# Patient Record
Sex: Male | Born: 1950
Health system: Southern US, Community
[De-identification: ages and names within clinical notes are randomized; demographics above are authoritative.]

## PROBLEM LIST (undated history)

## (undated) DIAGNOSIS — G971 Other reaction to spinal and lumbar puncture: Secondary | ICD-10-CM

## (undated) DIAGNOSIS — R2689 Other abnormalities of gait and mobility: Secondary | ICD-10-CM

## (undated) DIAGNOSIS — N529 Male erectile dysfunction, unspecified: Secondary | ICD-10-CM

## (undated) DIAGNOSIS — M549 Dorsalgia, unspecified: Secondary | ICD-10-CM

## (undated) DIAGNOSIS — R51 Headache: Secondary | ICD-10-CM

## (undated) DIAGNOSIS — IMO0001 Reserved for inherently not codable concepts without codable children: Secondary | ICD-10-CM

## (undated) DIAGNOSIS — R0609 Other forms of dyspnea: Secondary | ICD-10-CM

## (undated) DIAGNOSIS — K5900 Constipation, unspecified: Secondary | ICD-10-CM

## (undated) DIAGNOSIS — R42 Dizziness and giddiness: Secondary | ICD-10-CM

## (undated) DIAGNOSIS — E785 Hyperlipidemia, unspecified: Secondary | ICD-10-CM

## (undated) DIAGNOSIS — J349 Unspecified disorder of nose and nasal sinuses: Secondary | ICD-10-CM

## (undated) DIAGNOSIS — Z86010 Personal history of colonic polyps: Secondary | ICD-10-CM

## (undated) DIAGNOSIS — M2540 Effusion, unspecified joint: Secondary | ICD-10-CM

## (undated) DIAGNOSIS — R0602 Shortness of breath: Secondary | ICD-10-CM

## (undated) DIAGNOSIS — R112 Nausea with vomiting, unspecified: Secondary | ICD-10-CM

## (undated) DIAGNOSIS — I2510 Atherosclerotic heart disease of native coronary artery without angina pectoris: Secondary | ICD-10-CM

## (undated) DIAGNOSIS — H269 Unspecified cataract: Secondary | ICD-10-CM

## (undated) DIAGNOSIS — Z9889 Other specified postprocedural states: Secondary | ICD-10-CM

## (undated) DIAGNOSIS — G709 Myoneural disorder, unspecified: Secondary | ICD-10-CM

## (undated) DIAGNOSIS — E7800 Pure hypercholesterolemia, unspecified: Secondary | ICD-10-CM

## (undated) DIAGNOSIS — G4700 Insomnia, unspecified: Secondary | ICD-10-CM

## (undated) DIAGNOSIS — R319 Hematuria, unspecified: Secondary | ICD-10-CM

## (undated) DIAGNOSIS — Z9109 Other allergy status, other than to drugs and biological substances: Secondary | ICD-10-CM

## (undated) DIAGNOSIS — M1990 Unspecified osteoarthritis, unspecified site: Secondary | ICD-10-CM

## (undated) DIAGNOSIS — R079 Chest pain, unspecified: Secondary | ICD-10-CM

## (undated) DIAGNOSIS — R011 Cardiac murmur, unspecified: Secondary | ICD-10-CM

## (undated) DIAGNOSIS — K219 Gastro-esophageal reflux disease without esophagitis: Secondary | ICD-10-CM

## (undated) DIAGNOSIS — R61 Generalized hyperhidrosis: Secondary | ICD-10-CM

## (undated) DIAGNOSIS — G629 Polyneuropathy, unspecified: Secondary | ICD-10-CM

## (undated) DIAGNOSIS — M7989 Other specified soft tissue disorders: Secondary | ICD-10-CM

## (undated) DIAGNOSIS — I10 Essential (primary) hypertension: Secondary | ICD-10-CM

## (undated) DIAGNOSIS — T7840XA Allergy, unspecified, initial encounter: Secondary | ICD-10-CM

## (undated) DIAGNOSIS — M542 Cervicalgia: Secondary | ICD-10-CM

## (undated) DIAGNOSIS — R0989 Other specified symptoms and signs involving the circulatory and respiratory systems: Secondary | ICD-10-CM

## (undated) DIAGNOSIS — K5790 Diverticulosis of intestine, part unspecified, without perforation or abscess without bleeding: Secondary | ICD-10-CM

## (undated) HISTORY — DX: Cervicalgia: M54.2

## (undated) HISTORY — DX: Allergy, unspecified, initial encounter: T78.40XA

## (undated) HISTORY — DX: Hyperlipidemia, unspecified: E78.5

## (undated) HISTORY — DX: Atherosclerotic heart disease of native coronary artery without angina pectoris: I25.10

## (undated) HISTORY — DX: Other abnormalities of gait and mobility: R26.89

## (undated) HISTORY — DX: Unspecified cataract: H26.9

## (undated) HISTORY — DX: Effusion, unspecified joint: M25.40

## (undated) HISTORY — DX: Generalized hyperhidrosis: R61

## (undated) HISTORY — DX: Other specified soft tissue disorders: M79.89

## (undated) HISTORY — PX: FRACTURE SURGERY: SHX138

## (undated) HISTORY — PX: COLONOSCOPY: SHX174

## (undated) HISTORY — DX: Other forms of dyspnea: R06.09

## (undated) HISTORY — DX: Chest pain, unspecified: R07.9

## (undated) HISTORY — DX: Unspecified disorder of nose and nasal sinuses: J34.9

## (undated) HISTORY — DX: Dorsalgia, unspecified: M54.9

## (undated) HISTORY — PX: POLYPECTOMY: SHX149

## (undated) HISTORY — DX: Shortness of breath: R06.02

## (undated) HISTORY — DX: Hematuria, unspecified: R31.9

## (undated) HISTORY — PX: BACK SURGERY: SHX140

## (undated) HISTORY — DX: Pure hypercholesterolemia, unspecified: E78.00

## (undated) HISTORY — PX: CARDIAC CATHETERIZATION: SHX172

---
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
## 2001-09-01 ENCOUNTER — Observation Stay (HOSPITAL_COMMUNITY): Admission: EM | Admit: 2001-09-01 | Discharge: 2001-09-02 | Payer: Self-pay | Admitting: *Deleted

## 2001-09-01 ENCOUNTER — Encounter: Payer: Self-pay | Admitting: Orthopedic Surgery

## 2003-03-14 ENCOUNTER — Ambulatory Visit (HOSPITAL_COMMUNITY): Admission: RE | Admit: 2003-03-14 | Discharge: 2003-03-14 | Payer: Self-pay | Admitting: *Deleted

## 2005-06-21 ENCOUNTER — Ambulatory Visit: Payer: Self-pay | Admitting: Gastroenterology

## 2005-07-05 ENCOUNTER — Encounter: Payer: Self-pay | Admitting: Gastroenterology

## 2005-07-05 ENCOUNTER — Ambulatory Visit: Payer: Self-pay | Admitting: Gastroenterology

## 2010-10-29 ENCOUNTER — Other Ambulatory Visit: Payer: Self-pay | Admitting: Neurosurgery

## 2010-10-29 DIAGNOSIS — M549 Dorsalgia, unspecified: Secondary | ICD-10-CM

## 2010-10-29 DIAGNOSIS — M5410 Radiculopathy, site unspecified: Secondary | ICD-10-CM

## 2010-10-29 DIAGNOSIS — M5126 Other intervertebral disc displacement, lumbar region: Secondary | ICD-10-CM

## 2010-10-30 ENCOUNTER — Ambulatory Visit
Admission: RE | Admit: 2010-10-30 | Discharge: 2010-10-30 | Disposition: A | Discharge status: Home / Self Care | Payer: 59 | Source: Ambulatory Visit | Attending: Neurosurgery | Admitting: Neurosurgery

## 2010-10-30 DIAGNOSIS — M5126 Other intervertebral disc displacement, lumbar region: Secondary | ICD-10-CM

## 2010-10-30 DIAGNOSIS — M5410 Radiculopathy, site unspecified: Secondary | ICD-10-CM

## 2010-10-30 DIAGNOSIS — M549 Dorsalgia, unspecified: Secondary | ICD-10-CM

## 2010-11-17 ENCOUNTER — Other Ambulatory Visit: Payer: Self-pay | Admitting: Neurosurgery

## 2010-11-17 DIAGNOSIS — M549 Dorsalgia, unspecified: Secondary | ICD-10-CM

## 2010-11-17 DIAGNOSIS — M5410 Radiculopathy, site unspecified: Secondary | ICD-10-CM

## 2010-11-19 ENCOUNTER — Encounter (HOSPITAL_COMMUNITY)
Admission: RE | Admit: 2010-11-19 | Discharge: 2010-11-19 | Disposition: A | Discharge status: Home / Self Care | Payer: 59 | Source: Ambulatory Visit | Attending: Neurosurgery | Admitting: Neurosurgery

## 2010-11-19 ENCOUNTER — Other Ambulatory Visit (HOSPITAL_COMMUNITY): Payer: Self-pay | Admitting: Neurosurgery

## 2010-11-19 ENCOUNTER — Other Ambulatory Visit: Payer: 59

## 2010-11-19 ENCOUNTER — Ambulatory Visit (HOSPITAL_COMMUNITY)
Admission: RE | Admit: 2010-11-19 | Discharge: 2010-11-19 | Disposition: A | Discharge status: Home / Self Care | Payer: 59 | Source: Ambulatory Visit | Attending: Neurosurgery | Admitting: Neurosurgery

## 2010-11-19 DIAGNOSIS — Z01810 Encounter for preprocedural cardiovascular examination: Secondary | ICD-10-CM | POA: Insufficient documentation

## 2010-11-19 DIAGNOSIS — M5126 Other intervertebral disc displacement, lumbar region: Secondary | ICD-10-CM

## 2010-11-19 DIAGNOSIS — Z01812 Encounter for preprocedural laboratory examination: Secondary | ICD-10-CM | POA: Insufficient documentation

## 2010-11-19 DIAGNOSIS — Z01818 Encounter for other preprocedural examination: Secondary | ICD-10-CM | POA: Insufficient documentation

## 2010-11-19 DIAGNOSIS — I10 Essential (primary) hypertension: Secondary | ICD-10-CM | POA: Insufficient documentation

## 2010-11-19 LAB — BASIC METABOLIC PANEL
BUN: 26 mg/dL — ABNORMAL HIGH (ref 6–23)
Calcium: 9.1 mg/dL (ref 8.4–10.5)
GFR calc Af Amer: >60 mL/min (ref >60)
GFR calc non Af Amer: 55 mL/min — ABNORMAL LOW (ref >60)
Glucose, Bld: 133 mg/dL — ABNORMAL HIGH (ref 70–99)
Potassium: 4.3 mEq/L (ref 3.5–5.1)
Sodium: 133 mEq/L — ABNORMAL LOW (ref 135–145)

## 2010-11-19 LAB — CBC
HCT: 48.4 % (ref 39.0–52.0)
MCH: 31.9 pg (ref 26.0–34.0)
MCHC: 34.9 g/dL (ref 30.0–36.0)
RDW: 13.4 % (ref 11.5–15.5)

## 2010-11-20 ENCOUNTER — Ambulatory Visit (HOSPITAL_COMMUNITY): Payer: 59

## 2010-11-20 ENCOUNTER — Inpatient Hospital Stay (HOSPITAL_COMMUNITY)
Admission: RE | Admit: 2010-11-20 | Discharge: 2010-11-26 | DRG: 490 | Disposition: A | Discharge status: Home / Self Care | Payer: 59 | Source: Ambulatory Visit | Attending: Neurosurgery | Admitting: Neurosurgery

## 2010-11-20 DIAGNOSIS — G834 Cauda equina syndrome: Secondary | ICD-10-CM | POA: Diagnosis present

## 2010-11-20 DIAGNOSIS — IMO0002 Reserved for concepts with insufficient information to code with codable children: Secondary | ICD-10-CM | POA: Diagnosis not present

## 2010-11-20 DIAGNOSIS — Y9210 Unspecified residential institution as the place of occurrence of the external cause: Secondary | ICD-10-CM | POA: Diagnosis not present

## 2010-11-20 DIAGNOSIS — K219 Gastro-esophageal reflux disease without esophagitis: Secondary | ICD-10-CM | POA: Diagnosis present

## 2010-11-20 DIAGNOSIS — M48061 Spinal stenosis, lumbar region without neurogenic claudication: Principal | ICD-10-CM | POA: Diagnosis present

## 2010-11-20 DIAGNOSIS — G9741 Accidental puncture or laceration of dura during a procedure: Secondary | ICD-10-CM | POA: Diagnosis not present

## 2010-11-20 DIAGNOSIS — I10 Essential (primary) hypertension: Secondary | ICD-10-CM | POA: Diagnosis present

## 2010-11-22 LAB — URINALYSIS, ROUTINE W REFLEX MICROSCOPIC
Bilirubin Urine: NEGATIVE
Glucose, UA: NEGATIVE mg/dL
Ketones, ur: NEGATIVE mg/dL
Leukocytes, UA: NEGATIVE
Protein, ur: NEGATIVE mg/dL
pH: 6 (ref 5.0–8.0)

## 2010-11-22 LAB — CBC
HCT: 37.8 % — ABNORMAL LOW (ref 39.0–52.0)
Hemoglobin: 13.2 g/dL (ref 13.0–17.0)
MCH: 31.8 pg (ref 26.0–34.0)
MCHC: 34.9 g/dL (ref 30.0–36.0)
MCV: 91.1 fL (ref 78.0–100.0)
RDW: 13.5 % (ref 11.5–15.5)

## 2010-11-22 LAB — BASIC METABOLIC PANEL
BUN: 17 mg/dL (ref 6–23)
Calcium: 9.4 mg/dL (ref 8.4–10.5)
Creatinine, Ser: 0.85 mg/dL (ref 0.50–1.35)
GFR calc Af Amer: >60 mL/min (ref >60)
GFR calc non Af Amer: >60 mL/min (ref >60)
Glucose, Bld: 129 mg/dL — ABNORMAL HIGH (ref 70–99)
Potassium: 3.9 mEq/L (ref 3.5–5.1)

## 2010-11-23 NOTE — Op Note (Signed)
NAMEDEPAUL, ARIZPE               ACCOUNT NO.:  [id number]  MEDICAL RECORD NO.:  [id number]  LOCATION:  3033                         FACILITY:  [facility]  PHYSICIAN:  Hilda Lias, M.D.   DATE OF BIRTH:  1951-03-12  DATE OF PROCEDURE:  11/20/2010 DATE OF DISCHARGE:                              OPERATIVE REPORT   PREOPERATIVE DIAGNOSES:  Lumbar stenosis with stenosis at L3-4, L4-5, L5- S1 with a right L3-L4 herniated disk, cauda equina syndrome.  POSTOPERATIVE DIAGNOSES:  Lumbar stenosis with stenosis at L3-4, L4-5, L5-S1 with a right L3-L4 herniated disk, cauda equina syndrome.  PROCEDURE:  Bilateral 3, 4, 5 laminectomy, foraminotomy, right L3-L4 diskectomy.  Repair of iatrogenic dural tear at the level of right 3-4. Microscope.  SURGEON:  Hilda Lias, MD  ASSISTANT:  Clydene Fake, M.D.  CLINICAL HISTORY:  [name] is a gentleman who I have seen in my office complaining of back pain worsened to both legs.  The patient has stenosis at the level of 3-4, 4-5, 5-1.  The patient wanted to be conservative and to avoid any surgery.  He called me from [facility], [place],  where he was working currently and the pain was getting worse.  Next Monday, he was supposed to see me, but he decided to wait. Nevertheless, he came back with his girlfriend and at that time I found that he had severe weakness of dorsiflexion of the feet, weakness of the both quadriceps, weakness of plantar flexion, numbness in both legs, and difficulty urinating.  I offered him about the need for surgery immediately because he will develop the beginning of cauda equina syndrome.  Nevertheless, he declined surgery.  laast week,  he has an epidural injection at the level of L3-4.  Then, last night we got a call telling us that he has decided to go ahead with the surgery.  He later on called back to cancel, but finally he agreed with the surgery.  In the meantime, he had an epidural injection as I mentioned  above.  He came this morning.  He is taking Naprosyn, aspirin, and fish oil. Surgery was advised and the risks were fully explained to them including the possibility of bleeding because of the medication he was taking, no improvement  whatsoever because already the amount of weakness he is having and the possibility of CSF leak, also infection.  PROCEDURE IN DETAIL:  The patient was taken to the OR and after intubation he was positioned in prone manner.  The back was cleaned with DuraPrep.  Drapes were applied.  Midline incision was made.  The patient has quite a bit of adipose tissue and muscle and we were unable to feel any spinous process.  Incision was carried out and we were able to identify the L5, S1, L4-5 and L3-4 space.  Then, with the Leksell we removed the spinous process of 3, 4, 5 and with the drill we started drilling the lamina of 3, 4, 5 bilaterally.  The patient had quite a bit of overgrowth of the facet with thickening of the yellow ligament.  We started working all the way down from L5-S1 with decompression of the thecal sac  as well as the L5-S1 nerve root at the level of L4-5 with posterior decompression and removal of thick yellow ligament and foraminotomy to decompress both L4 and L5 nerve root.  Then, we started our surgery at the level of L3-4,  first on the left side and later on the right side.  Once we opened the yellow ligament, immediately we saw CSF coming.  Because of that, we had to drill a little bit higher up above L3 and worked on the right side from the top down.  We found that there was an opening in the dura mater dorsal anterior.  The opening was surrounded by a white glue that mostly was the Depo-Medrol we normally use for the epidural injection.  Then, with the help of the microscope, we used the 6-0 Prolene and 3 single stitches were used to repair the dura matter.  Valsalva maneuver performed was negative.  Then we retracted the thecal sac.  We  identified the L3 nerve root.  We went ahead and we found a large herniated disk mostly compromising the L3 and L4 nerve root.  Incision was made and large amount of degenerative disk disease medial and lateral were removed.  At the end, we had plenty of space for the thecal sac as well as the L3, 4, 5, and S1 nerve root. The patient has quite a bit of bleeding coming mostly from the bone tissue.  This was probably secondary to the fish oil and the Naprosyn that the patient had been taking.  Then, Surgicel was used to help out with the hemostasis.  Once this was achieved, the wound was closed with Vicryl and staples.  The patient is going to go to 3000.  He will remain flat in bed with the head of the bed down 5 degrees for the next 72 hours.          ______________________________ Hilda Lias, M.D.     EB/MEDQ  D:  11/20/2010  T:  11/21/2010  Job:  [id number]  Electronically Signed by Hilda Lias M.D. on 11/23/2010 01:50:00 PM

## 2010-11-24 DIAGNOSIS — M5106 Intervertebral disc disorders with myelopathy, lumbar region: Secondary | ICD-10-CM

## 2010-11-24 LAB — BASIC METABOLIC PANEL
Calcium: 8.2 mg/dL — ABNORMAL LOW (ref 8.4–10.5)
GFR calc Af Amer: >60 mL/min (ref >60)
GFR calc non Af Amer: 57 mL/min — ABNORMAL LOW (ref >60)
Glucose, Bld: 109 mg/dL — ABNORMAL HIGH (ref 70–99)
Potassium: 3.7 mEq/L (ref 3.5–5.1)
Sodium: 132 mEq/L — ABNORMAL LOW (ref 135–145)

## 2010-11-24 LAB — CBC
Hemoglobin: 13.2 g/dL (ref 13.0–17.0)
MCH: 31.9 pg (ref 26.0–34.0)
MCHC: 34.4 g/dL (ref 30.0–36.0)
RDW: 13.8 % (ref 11.5–15.5)

## 2010-11-27 NOTE — Discharge Summary (Signed)
  Darin Gomez, Darin Gomez               ACCOUNT NO.:  [id number]  MEDICAL RECORD NO.:  [id number]  LOCATION:  3033                         FACILITY:  [facility]  PHYSICIAN:  Hilda Lias, M.D.   DATE OF BIRTH:  [birth year]  DATE OF ADMISSION:  11/20/2010 DATE OF DISCHARGE:  11/26/2010                              DISCHARGE SUMMARY   ADMISSION DIAGNOSES:  Lumbar stenosis with cauda equina syndrome.  FINAL DIAGNOSES:  Lumbar stenosis with cauda equina syndrome.  CLINICAL HISTORY:  The patient was admitted because of back pain worse to both legs associated with weakness which developed quickly.  The patient also has difficulty urinating.  The patient has severe stenosis with a herniated disk at the level of L3-4 and stenosis L3-4, L4-5, L5- S1.  Surgery was advised.  Laboratory normal.  COURSE IN THE HOSPITAL:  The patient was taken to surgery, decompressive laminectomy and diskectomy was done.  The patient had quite a bit of pain at the beginning, but today he is ambulating.  He is ready to walk, he is urinating without any problem.  The patient was seen by Physical Therapy and Occupational Therapy.  Today, he is being discharged to be followed by me in 10 days.  CONDITION ON DISCHARGE:  Improvement.  MEDICATION:  Oxycodone, baclofen and Neurontin.  DIET:  Regular activity.  Not to drive for at least 10 days.  FOLLOWUP:  He will be seen by me on December 07, 2010, any question between now and then he is to call anytime.          ______________________________ Hilda Lias, M.D.     EB/MEDQ  D:  11/26/2010  T:  11/26/2010  Job:  [id number]  Electronically Signed by Hilda Lias M.D. on 11/27/2010 11:27:47 AM

## 2010-12-05 ENCOUNTER — Emergency Department (HOSPITAL_COMMUNITY)
Admission: EM | Admit: 2010-12-05 | Discharge: 2010-12-05 | Disposition: A | Discharge status: Home / Self Care | Payer: 59 | Attending: Emergency Medicine | Admitting: Emergency Medicine

## 2010-12-05 DIAGNOSIS — M7989 Other specified soft tissue disorders: Secondary | ICD-10-CM | POA: Insufficient documentation

## 2010-12-05 DIAGNOSIS — I10 Essential (primary) hypertension: Secondary | ICD-10-CM | POA: Insufficient documentation

## 2010-12-05 DIAGNOSIS — M79609 Pain in unspecified limb: Secondary | ICD-10-CM

## 2010-12-05 DIAGNOSIS — Z79899 Other long term (current) drug therapy: Secondary | ICD-10-CM | POA: Insufficient documentation

## 2010-12-05 DIAGNOSIS — E7800 Pure hypercholesterolemia, unspecified: Secondary | ICD-10-CM | POA: Insufficient documentation

## 2010-12-05 LAB — BASIC METABOLIC PANEL
BUN: 12 mg/dL (ref 6–23)
Creatinine, Ser: 1.01 mg/dL (ref 0.50–1.35)
GFR calc Af Amer: >60 mL/min (ref >60)
GFR calc non Af Amer: >60 mL/min (ref >60)

## 2010-12-05 LAB — DIFFERENTIAL
Basophils Absolute: 0 10*3/uL (ref 0.0–0.1)
Basophils Relative: 0 % (ref 0–1)
Eosinophils Relative: 3 % (ref 0–5)
Lymphocytes Relative: 25 % (ref 12–46)
Monocytes Absolute: 1.1 10*3/uL — ABNORMAL HIGH (ref 0.1–1.0)
Neutro Abs: 3.6 10*3/uL (ref 1.7–7.7)

## 2010-12-05 LAB — PROTIME-INR
INR: 1.06 (ref 0.00–1.49)
Prothrombin Time: 14 seconds (ref 11.6–15.2)

## 2010-12-05 LAB — APTT: aPTT: 34 seconds (ref 24–37)

## 2010-12-05 LAB — CBC
Hemoglobin: 12.1 g/dL — ABNORMAL LOW (ref 13.0–17.0)
MCHC: 34.9 g/dL (ref 30.0–36.0)
RDW: 13.6 % (ref 11.5–15.5)
WBC: 6.7 10*3/uL (ref 4.0–10.5)

## 2012-01-27 ENCOUNTER — Other Ambulatory Visit: Payer: Self-pay | Admitting: Anesthesiology

## 2012-01-27 DIAGNOSIS — R159 Full incontinence of feces: Secondary | ICD-10-CM

## 2012-01-27 NOTE — Progress Notes (Signed)
Blood drawn for labs for MRI Sunday; from right [facility] space without difficulty; site unremarkable.  Victory Dakin, RN

## 2012-01-30 ENCOUNTER — Ambulatory Visit
Admission: RE | Admit: 2012-01-30 | Discharge: 2012-01-30 | Disposition: A | Discharge status: Home / Self Care | Payer: 59 | Source: Ambulatory Visit | Attending: Anesthesiology | Admitting: Anesthesiology

## 2012-01-30 DIAGNOSIS — R159 Full incontinence of feces: Secondary | ICD-10-CM

## 2012-01-30 MED ORDER — GADOBENATE DIMEGLUMINE 529 MG/ML IV SOLN
20.0000 mL | Freq: Once | INTRAVENOUS | Status: AC | PRN
  Administered 2012-01-30: 20 mL via INTRAVENOUS

## 2012-02-22 ENCOUNTER — Other Ambulatory Visit: Payer: Self-pay | Admitting: Neurosurgery

## 2012-02-22 DIAGNOSIS — M549 Dorsalgia, unspecified: Secondary | ICD-10-CM

## 2012-02-22 DIAGNOSIS — M5410 Radiculopathy, site unspecified: Secondary | ICD-10-CM

## 2012-02-24 ENCOUNTER — Ambulatory Visit
Admission: RE | Admit: 2012-02-24 | Discharge: 2012-02-24 | Disposition: A | Discharge status: Home / Self Care | Payer: 59 | Source: Ambulatory Visit | Attending: Neurosurgery | Admitting: Neurosurgery

## 2012-02-24 VITALS — BP 106/58 | HR 54

## 2012-02-24 DIAGNOSIS — M5410 Radiculopathy, site unspecified: Secondary | ICD-10-CM

## 2012-02-24 DIAGNOSIS — M549 Dorsalgia, unspecified: Secondary | ICD-10-CM

## 2012-02-24 MED ORDER — IOHEXOL 180 MG/ML  SOLN
16.0000 mL | Freq: Once | INTRAMUSCULAR | Status: AC | PRN
  Administered 2012-02-24: 16 mL via INTRATHECAL

## 2012-02-24 MED ORDER — HYDROMORPHONE HCL PF 2 MG/ML IJ SOLN
2.0000 mg | Freq: Once | INTRAMUSCULAR | Status: AC
  Administered 2012-02-24: 2 mg via INTRAMUSCULAR

## 2012-02-24 MED ORDER — ONDANSETRON HCL 4 MG/2ML IJ SOLN
4.0000 mg | Freq: Once | INTRAMUSCULAR | Status: AC
  Administered 2012-02-24: 4 mg via INTRAMUSCULAR

## 2012-03-01 ENCOUNTER — Other Ambulatory Visit: Payer: Self-pay | Admitting: Neurosurgery

## 2012-03-13 ENCOUNTER — Encounter (HOSPITAL_COMMUNITY): Payer: Self-pay | Admitting: Pharmacy Technician

## 2012-03-13 NOTE — Pre-Procedure Instructions (Signed)
[address]   03/13/2012   Your procedure is scheduled on:  Wednesday March 15, 2012.   Report to [facility] at 0630 AM.  Call this number if you have problems the morning of surgery: [phone]   Remember:   Do not eat food or drink any liquids:After Midnight Tuesday    Take these medicines the morning of surgery with A SIP OF WATER: baclofen, pain medication   Do not wear jewelry  Do not wear lotions or colognes  Men may shave face and neck.   Do not bring valuables to the hospital.  Contacts, dentures or bridgework may not be worn into surgery.  Leave suitcase in the car. After surgery it may be brought to your room.  For patients admitted to the hospital, checkout time is 11:00 AM the day of discharge.   Patients discharged the day of surgery will not be allowed to drive home.   Name and phone number of your driver:    Special Instructions: Shower using CHG 2 nights before surgery and the night before surgery.  If you shower the day of surgery use CHG.  Use special wash - you have one bottle of CHG for all showers.  You should use approximately 1/3 of the bottle for each shower.   Please read over the following fact sheets that you were given: Pain Booklet, Coughing and Deep Breathing, MRSA Information and Surgical Site Infection Prevention

## 2012-03-14 ENCOUNTER — Ambulatory Visit (HOSPITAL_COMMUNITY)
Admission: RE | Admit: 2012-03-14 | Discharge: 2012-03-14 | Disposition: A | Discharge status: Home / Self Care | Payer: 59 | Source: Ambulatory Visit | Attending: Neurosurgery | Admitting: Neurosurgery

## 2012-03-14 ENCOUNTER — Encounter (HOSPITAL_COMMUNITY): Payer: Self-pay

## 2012-03-14 ENCOUNTER — Encounter (HOSPITAL_COMMUNITY)
Admission: RE | Admit: 2012-03-14 | Discharge: 2012-03-14 | Disposition: A | Discharge status: Home / Self Care | Payer: 59 | Source: Ambulatory Visit | Attending: Neurosurgery | Admitting: Neurosurgery

## 2012-03-14 DIAGNOSIS — Z01818 Encounter for other preprocedural examination: Secondary | ICD-10-CM | POA: Insufficient documentation

## 2012-03-14 DIAGNOSIS — Z01812 Encounter for preprocedural laboratory examination: Secondary | ICD-10-CM | POA: Insufficient documentation

## 2012-03-14 HISTORY — DX: Headache: R51

## 2012-03-14 HISTORY — DX: Other specified postprocedural states: R11.2

## 2012-03-14 HISTORY — DX: Nausea with vomiting, unspecified: R11.2

## 2012-03-14 HISTORY — DX: Essential (primary) hypertension: I10

## 2012-03-14 HISTORY — DX: Other specified postprocedural states: Z98.890

## 2012-03-14 HISTORY — DX: Cardiac murmur, unspecified: R01.1

## 2012-03-14 HISTORY — DX: Unspecified osteoarthritis, unspecified site: M19.90

## 2012-03-14 LAB — SURGICAL PCR SCREEN
MRSA, PCR: NEGATIVE
Staphylococcus aureus: NEGATIVE

## 2012-03-14 LAB — BASIC METABOLIC PANEL
Chloride: 101 mEq/L (ref 96–112)
GFR calc Af Amer: >90 mL/min (ref >90)
Potassium: 3.7 mEq/L (ref 3.5–5.1)
Sodium: 137 mEq/L (ref 135–145)

## 2012-03-14 LAB — CBC
HCT: 45.4 % (ref 39.0–52.0)
Hemoglobin: 15.7 g/dL (ref 13.0–17.0)
RBC: 5.17 MIL/uL (ref 4.22–5.81)
RDW: 12.9 % (ref 11.5–15.5)
WBC: 8.1 10*3/uL (ref 4.0–10.5)

## 2012-03-14 NOTE — Progress Notes (Addendum)
11:55    Left message at office to have Dr. Jeral Fruit release his orders (they were under "signed & held"...DA  12:15    PT. STATED THAT DURING HIS LAST SURGERY, IT TOOK HIM LONGER TO WAKE UP...(ALSO LOST A FAIR AMOUNT OF BLOOD)....DA  12:35   pT STATED THAT AROUND 2 MTHS AGO .Marland KitchenVITREOUS IN LEFT EYE IS STARTING TO PULL AWAY. AND, THAT BACK IN June 2012 FOR THAT SURGERY,  AFTER USING THE CHG SOAP, HE HAD A RASH COME UP .Marland KitchenI INSTRUCTED HIM TO SHOWER WITH DIAL ANTIBACTERIAL SOAP ...Marland KitchenMarland KitchenDA

## 2012-03-15 ENCOUNTER — Ambulatory Visit (HOSPITAL_COMMUNITY): Payer: 59 | Admitting: *Deleted

## 2012-03-15 ENCOUNTER — Encounter (HOSPITAL_COMMUNITY)
Admission: RE | Disposition: A | Discharge status: Home / Self Care | Payer: Self-pay | Source: Ambulatory Visit | Attending: Neurosurgery

## 2012-03-15 ENCOUNTER — Encounter (HOSPITAL_COMMUNITY): Payer: Self-pay | Admitting: *Deleted

## 2012-03-15 ENCOUNTER — Ambulatory Visit (HOSPITAL_COMMUNITY): Payer: 59

## 2012-03-15 ENCOUNTER — Inpatient Hospital Stay (HOSPITAL_COMMUNITY)
Admission: RE | Admit: 2012-03-15 | Discharge: 2012-03-26 | DRG: 490 | Disposition: A | Discharge status: Home / Self Care | Payer: 59 | Source: Ambulatory Visit | Attending: Neurosurgery | Admitting: Neurosurgery

## 2012-03-15 DIAGNOSIS — Z87891 Personal history of nicotine dependence: Secondary | ICD-10-CM

## 2012-03-15 DIAGNOSIS — I10 Essential (primary) hypertension: Secondary | ICD-10-CM | POA: Diagnosis present

## 2012-03-15 DIAGNOSIS — IMO0002 Reserved for concepts with insufficient information to code with codable children: Secondary | ICD-10-CM | POA: Diagnosis not present

## 2012-03-15 DIAGNOSIS — Z79899 Other long term (current) drug therapy: Secondary | ICD-10-CM

## 2012-03-15 DIAGNOSIS — Z7982 Long term (current) use of aspirin: Secondary | ICD-10-CM

## 2012-03-15 DIAGNOSIS — R339 Retention of urine, unspecified: Secondary | ICD-10-CM | POA: Diagnosis not present

## 2012-03-15 DIAGNOSIS — N319 Neuromuscular dysfunction of bladder, unspecified: Secondary | ICD-10-CM | POA: Diagnosis present

## 2012-03-15 DIAGNOSIS — M48062 Spinal stenosis, lumbar region with neurogenic claudication: Principal | ICD-10-CM | POA: Diagnosis present

## 2012-03-15 DIAGNOSIS — G9741 Accidental puncture or laceration of dura during a procedure: Secondary | ICD-10-CM | POA: Diagnosis not present

## 2012-03-15 HISTORY — PX: LUMBAR LAMINECTOMY/DECOMPRESSION MICRODISCECTOMY: SHX5026

## 2012-03-15 SURGERY — LUMBAR LAMINECTOMY/DECOMPRESSION MICRODISCECTOMY 2 LEVELS
Anesthesia: General | Site: Back | Laterality: Bilateral | Wound class: Clean

## 2012-03-15 MED ORDER — OXYCODONE HCL 5 MG PO TABS: 5.0000 mg | ORAL_TABLET | Freq: Once | ORAL | Status: DC | PRN

## 2012-03-15 MED ORDER — PHENOL 1.4 % MT LIQD: 1.0000 | OROMUCOSAL | Status: DC | PRN

## 2012-03-15 MED ORDER — HYDROMORPHONE HCL PF 1 MG/ML IJ SOLN
1.0000 mg | Freq: Once | INTRAMUSCULAR | Status: AC
  Administered 2012-03-15: 1 mg via INTRAVENOUS

## 2012-03-15 MED ORDER — SODIUM CHLORIDE 0.9 % IV SOLN
INTRAVENOUS | Status: DC
  Administered 2012-03-15: 17:00:00 via INTRAVENOUS
  Administered 2012-03-16: 75 mL via INTRAVENOUS
  Administered 2012-03-16 – 2012-03-21 (×9): via INTRAVENOUS
  Administered 2012-03-23: 1000 mL via INTRAVENOUS
  Administered 2012-03-24: 09:00:00 via INTRAVENOUS

## 2012-03-15 MED ORDER — LACTATED RINGERS IV SOLN
INTRAVENOUS | Status: DC | PRN
  Administered 2012-03-15 (×4): via INTRAVENOUS

## 2012-03-15 MED ORDER — MORPHINE SULFATE 4 MG/ML IJ SOLN
4.0000 mg | INTRAMUSCULAR | Status: DC | PRN
  Administered 2012-03-15: 4 mg via INTRAVENOUS
  Administered 2012-03-15: 2 mg via INTRAVENOUS
  Administered 2012-03-16 (×2): 4 mg via INTRAVENOUS
  Administered 2012-03-16 (×2): 2 mg via INTRAVENOUS
  Administered 2012-03-17 (×4): 4 mg via INTRAVENOUS
  Administered 2012-03-17: 2 mg via INTRAVENOUS
  Administered 2012-03-17 – 2012-03-21 (×22): 4 mg via INTRAVENOUS
  Filled 2012-03-15 (×33): qty 1

## 2012-03-15 MED ORDER — BACLOFEN 20 MG PO TABS
20.0000 mg | ORAL_TABLET | Freq: Three times a day (TID) | ORAL | Status: DC
  Administered 2012-03-15 – 2012-03-26 (×33): 20 mg via ORAL
  Filled 2012-03-15 (×37): qty 1

## 2012-03-15 MED ORDER — THROMBIN 5000 UNITS EX KIT
PACK | CUTANEOUS | Status: DC | PRN
  Administered 2012-03-15 (×4): 5000 [IU] via TOPICAL

## 2012-03-15 MED ORDER — ACETAMINOPHEN 650 MG RE SUPP: 650.0000 mg | RECTAL | Status: DC | PRN

## 2012-03-15 MED ORDER — SODIUM CHLORIDE 0.9 % IJ SOLN
3.0000 mL | Freq: Two times a day (BID) | INTRAMUSCULAR | Status: DC
  Administered 2012-03-15 – 2012-03-22 (×9): 3 mL via INTRAVENOUS
  Administered 2012-03-23: 09:00:00 via INTRAVENOUS
  Administered 2012-03-25 (×2): 3 mL via INTRAVENOUS

## 2012-03-15 MED ORDER — OXYCODONE-ACETAMINOPHEN 5-325 MG PO TABS
1.0000 | ORAL_TABLET | ORAL | Status: DC | PRN
  Administered 2012-03-15 – 2012-03-21 (×29): 2 via ORAL
  Filled 2012-03-15 (×29): qty 2

## 2012-03-15 MED ORDER — GLYCOPYRROLATE 0.2 MG/ML IJ SOLN
INTRAMUSCULAR | Status: DC | PRN
  Administered 2012-03-15: .6 mg via INTRAVENOUS

## 2012-03-15 MED ORDER — HYDROMORPHONE HCL PF 1 MG/ML IJ SOLN
INTRAMUSCULAR | Status: AC
  Filled 2012-03-15: qty 1

## 2012-03-15 MED ORDER — MIDAZOLAM HCL 5 MG/5ML IJ SOLN
INTRAMUSCULAR | Status: DC | PRN
  Administered 2012-03-15: 2 mg via INTRAVENOUS

## 2012-03-15 MED ORDER — ONDANSETRON HCL 4 MG/2ML IJ SOLN: 4.0000 mg | Freq: Once | INTRAMUSCULAR | Status: DC | PRN

## 2012-03-15 MED ORDER — ROCURONIUM BROMIDE 100 MG/10ML IV SOLN
INTRAVENOUS | Status: DC | PRN
  Administered 2012-03-15: 50 mg via INTRAVENOUS

## 2012-03-15 MED ORDER — ACETAMINOPHEN 325 MG PO TABS
650.0000 mg | ORAL_TABLET | ORAL | Status: DC | PRN
  Administered 2012-03-21 – 2012-03-26 (×12): 650 mg via ORAL
  Filled 2012-03-15 (×12): qty 2

## 2012-03-15 MED ORDER — CEFAZOLIN SODIUM 1-5 GM-% IV SOLN
1.0000 g | Freq: Three times a day (TID) | INTRAVENOUS | Status: AC
  Administered 2012-03-15 – 2012-03-16 (×2): 1 g via INTRAVENOUS
  Filled 2012-03-15 (×2): qty 50

## 2012-03-15 MED ORDER — TAMSULOSIN HCL 0.4 MG PO CAPS
0.8000 mg | ORAL_CAPSULE | Freq: Every day | ORAL | Status: DC
  Administered 2012-03-16 – 2012-03-26 (×11): 0.8 mg via ORAL
  Filled 2012-03-15 (×11): qty 2

## 2012-03-15 MED ORDER — DIAZEPAM 5 MG PO TABS: 5.0000 mg | ORAL_TABLET | Freq: Four times a day (QID) | ORAL | Status: DC | PRN

## 2012-03-15 MED ORDER — LORATADINE 10 MG PO TABS
10.0000 mg | ORAL_TABLET | Freq: Every day | ORAL | Status: DC
  Administered 2012-03-15 – 2012-03-26 (×12): 10 mg via ORAL
  Filled 2012-03-15 (×12): qty 1

## 2012-03-15 MED ORDER — MIDAZOLAM HCL 2 MG/2ML IJ SOLN
1.0000 mg | Freq: Once | INTRAMUSCULAR | Status: AC
  Administered 2012-03-15: 1 mg via INTRAVENOUS

## 2012-03-15 MED ORDER — ALBUMIN HUMAN 5 % IV SOLN
INTRAVENOUS | Status: DC | PRN
  Administered 2012-03-15: 11:00:00 via INTRAVENOUS

## 2012-03-15 MED ORDER — LISINOPRIL-HYDROCHLOROTHIAZIDE 20-12.5 MG PO TABS: 1.0000 | ORAL_TABLET | Freq: Every day | ORAL | Status: DC

## 2012-03-15 MED ORDER — HYDROMORPHONE HCL PF 1 MG/ML IJ SOLN
INTRAMUSCULAR | Status: DC | PRN
  Administered 2012-03-15: .3 mg via INTRAVENOUS
  Administered 2012-03-15: .4 mg via INTRAVENOUS
  Administered 2012-03-15: .3 mg via INTRAVENOUS

## 2012-03-15 MED ORDER — NEOSTIGMINE METHYLSULFATE 1 MG/ML IJ SOLN
INTRAMUSCULAR | Status: DC | PRN
  Administered 2012-03-15: 5 mg via INTRAVENOUS

## 2012-03-15 MED ORDER — PROPOFOL 10 MG/ML IV BOLUS
INTRAVENOUS | Status: DC | PRN
  Administered 2012-03-15: 160 mg via INTRAVENOUS

## 2012-03-15 MED ORDER — ONDANSETRON HCL 4 MG/2ML IJ SOLN
4.0000 mg | INTRAMUSCULAR | Status: DC | PRN
  Administered 2012-03-17 – 2012-03-24 (×15): 4 mg via INTRAVENOUS
  Filled 2012-03-15 (×15): qty 2

## 2012-03-15 MED ORDER — NICOTINE 14 MG/24HR TD PT24
14.0000 mg | MEDICATED_PATCH | TRANSDERMAL | Status: DC
  Administered 2012-03-15 – 2012-03-17 (×2): 14 mg via TRANSDERMAL
  Filled 2012-03-15 (×4): qty 1

## 2012-03-15 MED ORDER — HEMOSTATIC AGENTS (NO CHARGE) OPTIME
TOPICAL | Status: DC | PRN
  Administered 2012-03-15 (×2): 1 via TOPICAL

## 2012-03-15 MED ORDER — SODIUM CHLORIDE 0.9 % IV SOLN: 250.0000 mL | INTRAVENOUS | Status: DC

## 2012-03-15 MED ORDER — LIDOCAINE HCL (CARDIAC) 20 MG/ML IV SOLN
INTRAVENOUS | Status: DC | PRN
  Administered 2012-03-15: 100 mg via INTRAVENOUS

## 2012-03-15 MED ORDER — OXYCODONE HCL 5 MG/5ML PO SOLN: 5.0000 mg | Freq: Once | ORAL | Status: DC | PRN

## 2012-03-15 MED ORDER — ONDANSETRON HCL 4 MG/2ML IJ SOLN
INTRAMUSCULAR | Status: DC | PRN
  Administered 2012-03-15: 4 mg via INTRAVENOUS

## 2012-03-15 MED ORDER — FENTANYL CITRATE 0.05 MG/ML IJ SOLN
INTRAMUSCULAR | Status: DC | PRN
  Administered 2012-03-15 (×3): 50 ug via INTRAVENOUS
  Administered 2012-03-15: 100 ug via INTRAVENOUS
  Administered 2012-03-15: 50 ug via INTRAVENOUS
  Administered 2012-03-15 (×2): 100 ug via INTRAVENOUS

## 2012-03-15 MED ORDER — HYDROMORPHONE HCL PF 1 MG/ML IJ SOLN
0.2500 mg | INTRAMUSCULAR | Status: DC | PRN
  Administered 2012-03-15 (×4): 0.5 mg via INTRAVENOUS

## 2012-03-15 MED ORDER — SODIUM CHLORIDE 0.9 % IJ SOLN: 3.0000 mL | INTRAMUSCULAR | Status: DC | PRN

## 2012-03-15 MED ORDER — CEFAZOLIN SODIUM-DEXTROSE 2-3 GM-% IV SOLR
INTRAVENOUS | Status: AC
  Administered 2012-03-15: 2 g via INTRAVENOUS
  Filled 2012-03-15: qty 50

## 2012-03-15 MED ORDER — SIMVASTATIN 5 MG PO TABS
5.0000 mg | ORAL_TABLET | Freq: Every day | ORAL | Status: DC
  Administered 2012-03-15 – 2012-03-25 (×11): 5 mg via ORAL
  Filled 2012-03-15 (×12): qty 1

## 2012-03-15 MED ORDER — MEPERIDINE HCL 25 MG/ML IJ SOLN: 6.2500 mg | INTRAMUSCULAR | Status: DC | PRN

## 2012-03-15 MED ORDER — LISINOPRIL 20 MG PO TABS
20.0000 mg | ORAL_TABLET | Freq: Every day | ORAL | Status: DC
  Administered 2012-03-15 – 2012-03-24 (×5): 20 mg via ORAL
  Filled 2012-03-15 (×12): qty 1

## 2012-03-15 MED ORDER — MIDAZOLAM HCL 2 MG/2ML IJ SOLN
INTRAMUSCULAR | Status: AC
  Filled 2012-03-15: qty 2

## 2012-03-15 MED ORDER — DEXAMETHASONE SODIUM PHOSPHATE 4 MG/ML IJ SOLN
INTRAMUSCULAR | Status: DC | PRN
  Administered 2012-03-15: 8 mg via INTRAVENOUS

## 2012-03-15 MED ORDER — 0.9 % SODIUM CHLORIDE (POUR BTL) OPTIME
TOPICAL | Status: DC | PRN
  Administered 2012-03-15 (×3): 1000 mL

## 2012-03-15 MED ORDER — THROMBIN 5000 UNITS EX SOLR
OROMUCOSAL | Status: DC | PRN
  Administered 2012-03-15 (×2): via TOPICAL

## 2012-03-15 MED ORDER — HYDROCHLOROTHIAZIDE 12.5 MG PO CAPS
12.5000 mg | ORAL_CAPSULE | Freq: Every day | ORAL | Status: DC
  Administered 2012-03-15 – 2012-03-24 (×6): 12.5 mg via ORAL
  Filled 2012-03-15 (×12): qty 1

## 2012-03-15 MED ORDER — MENTHOL 3 MG MT LOZG: 1.0000 | LOZENGE | OROMUCOSAL | Status: DC | PRN

## 2012-03-15 SURGICAL SUPPLY — 74 items
0.5% SENSORCAINE AND EPI IMPLANT
APL SKNCLS STERI-STRIP NONHPOA (GAUZE/BANDAGES/DRESSINGS) ×1
BENZOIN TINCTURE PRP APPL 2/3 (GAUZE/BANDAGES/DRESSINGS) ×2 IMPLANT
BLADE SURG ROTATE 9660 (MISCELLANEOUS) ×1 IMPLANT
BOWL SPATULA (MISCELLANEOUS) ×1 IMPLANT
BUR ACORN 6.0 (BURR) ×2 IMPLANT
BUR MATCHSTICK NEURO 3.0 LAGG (BURR) ×3 IMPLANT
CANISTER SUCTION 2500CC (MISCELLANEOUS) ×2 IMPLANT
CATH ROBINSON RED A/P 14FR (CATHETERS) ×1 IMPLANT
CLOTH BEACON ORANGE TIMEOUT ST (SAFETY) ×2 IMPLANT
CONT SPEC 4OZ CLIKSEAL STRL BL (MISCELLANEOUS) ×2 IMPLANT
DRAPE LAPAROTOMY 100X72X124 (DRAPES) ×2 IMPLANT
DRAPE MICROSCOPE LEICA (MISCELLANEOUS) ×2 IMPLANT
DRAPE POUCH INSTRU U-SHP 10X18 (DRAPES) ×2 IMPLANT
DRSG PAD ABDOMINAL 8X10 ST (GAUZE/BANDAGES/DRESSINGS) ×1 IMPLANT
DURAPREP 26ML APPLICATOR (WOUND CARE) ×2 IMPLANT
DURASEAL SPINE SEALANT 3ML (MISCELLANEOUS) ×1 IMPLANT
ELECT REM PT RETURN 9FT ADLT (ELECTROSURGICAL) ×2
ELECTRODE REM PT RTRN 9FT ADLT (ELECTROSURGICAL) ×1 IMPLANT
EVACUATOR 3/16  PVC DRAIN (DRAIN) ×1
EVACUATOR 3/16 PVC DRAIN (DRAIN) IMPLANT
EXTENDED TIP APPLICATOR 8CM ×1 IMPLANT
GAUZE SPONGE 4X4 16PLY XRAY LF (GAUZE/BANDAGES/DRESSINGS) ×2 IMPLANT
GLOVE BIO SURGEON STRL SZ8.5 (GLOVE) ×1 IMPLANT
GLOVE BIOGEL M 8.0 STRL (GLOVE) ×2 IMPLANT
GLOVE BIOGEL PI IND STRL 7.0 (GLOVE) IMPLANT
GLOVE BIOGEL PI IND STRL 7.5 (GLOVE) IMPLANT
GLOVE BIOGEL PI INDICATOR 7.0 (GLOVE) ×2
GLOVE BIOGEL PI INDICATOR 7.5 (GLOVE) ×1
GLOVE ECLIPSE 7.5 STRL STRAW (GLOVE) ×2 IMPLANT
GLOVE EXAM NITRILE LRG STRL (GLOVE) IMPLANT
GLOVE EXAM NITRILE MD LF STRL (GLOVE) ×1 IMPLANT
GLOVE EXAM NITRILE XL STR (GLOVE) IMPLANT
GLOVE EXAM NITRILE XS STR PU (GLOVE) IMPLANT
GLOVE SS BIOGEL STRL SZ 8 (GLOVE) IMPLANT
GLOVE SUPERSENSE BIOGEL SZ 8 (GLOVE) ×1
GLOVE SURG SS PI 6.5 STRL IVOR (GLOVE) ×3 IMPLANT
GOWN BRE IMP SLV AUR LG STRL (GOWN DISPOSABLE) ×4 IMPLANT
GOWN BRE IMP SLV AUR XL STRL (GOWN DISPOSABLE) ×2 IMPLANT
GOWN STRL REIN 2XL LVL4 (GOWN DISPOSABLE) IMPLANT
HEMOSTAT POWDER SURGIFOAM 1G (HEMOSTASIS) ×2 IMPLANT
KIT BASIN OR (CUSTOM PROCEDURE TRAY) ×2 IMPLANT
KIT ROOM TURNOVER OR (KITS) ×2 IMPLANT
NDL HYPO 18GX1.5 BLUNT FILL (NEEDLE) IMPLANT
NDL HYPO 21X1.5 SAFETY (NEEDLE) IMPLANT
NDL HYPO 25X1 1.5 SAFETY (NEEDLE) IMPLANT
NDL SPNL 20GX3.5 QUINCKE YW (NEEDLE) IMPLANT
NEEDLE HYPO 18GX1.5 BLUNT FILL (NEEDLE) IMPLANT
NEEDLE HYPO 21X1.5 SAFETY (NEEDLE) IMPLANT
NEEDLE HYPO 25X1 1.5 SAFETY (NEEDLE) ×2 IMPLANT
NEEDLE SPNL 20GX3.5 QUINCKE YW (NEEDLE) ×2 IMPLANT
NS IRRIG 1000ML POUR BTL (IV SOLUTION) ×2 IMPLANT
PACK LAMINECTOMY NEURO (CUSTOM PROCEDURE TRAY) ×2 IMPLANT
PAD ARMBOARD 7.5X6 YLW CONV (MISCELLANEOUS) ×6 IMPLANT
PATTIES SURGICAL .5 X1 (DISPOSABLE) ×2 IMPLANT
RUBBERBAND STERILE (MISCELLANEOUS) ×4 IMPLANT
SPONGE GAUZE 4X4 12PLY (GAUZE/BANDAGES/DRESSINGS) ×3 IMPLANT
SPONGE LAP 4X18 X RAY DECT (DISPOSABLE) IMPLANT
SPONGE SURGIFOAM ABS GEL SZ50 (HEMOSTASIS) ×3 IMPLANT
STRIP CLOSURE SKIN 1/2X4 (GAUZE/BANDAGES/DRESSINGS) ×2 IMPLANT
SUT ETHILON 2 0 FS 18 (SUTURE) ×2 IMPLANT
SUT NURALON 4 0 TR CR/8 (SUTURE) ×1 IMPLANT
SUT PROLENE 6 0 BV (SUTURE) ×1 IMPLANT
SUT VIC AB 0 CT1 18XCR BRD8 (SUTURE) ×1 IMPLANT
SUT VIC AB 0 CT1 8-18 (SUTURE) ×4
SUT VIC AB 2-0 CP2 18 (SUTURE) ×3 IMPLANT
SUT VIC AB 3-0 SH 8-18 (SUTURE) ×3 IMPLANT
SYR 20CC LL (SYRINGE) IMPLANT
SYR 20ML ECCENTRIC (SYRINGE) ×2 IMPLANT
SYR 5ML LL (SYRINGE) IMPLANT
TAPE CLOTH SURG 4X10 WHT LF (GAUZE/BANDAGES/DRESSINGS) ×1 IMPLANT
TOWEL OR 17X24 6PK STRL BLUE (TOWEL DISPOSABLE) ×2 IMPLANT
TOWEL OR 17X26 10 PK STRL BLUE (TOWEL DISPOSABLE) ×2 IMPLANT
WATER STERILE IRR 1000ML POUR (IV SOLUTION) ×2 IMPLANT

## 2012-03-15 NOTE — OR Nursing (Signed)
Patient given Versed in holding - brought back to Operating Room before Dr. Rexene Edison & P on chart

## 2012-03-15 NOTE — Transfer of Care (Signed)
Immediate Anesthesia Transfer of Care Note  Patient: Darin Gomez  Procedure(s) Performed: Procedure(s) (LRB) with comments: LUMBAR LAMINECTOMY/DECOMPRESSION MICRODISCECTOMY 2 LEVELS (Bilateral) - Bilateral Lumbar three, lumbar four, lumbar five Laminectomy  Patient Location: PACU  Anesthesia Type: General  Level of Consciousness: awake and alert   Airway & Oxygen Therapy: Patient Spontanous Breathing and Patient connected to nasal cannula oxygen  Post-op Assessment: Report given to PACU RN and Post -op Vital signs reviewed and stable  Post vital signs: Reviewed and stable  Complications: No apparent anesthesia complications

## 2012-03-15 NOTE — Progress Notes (Signed)
Doing well. No headache. No weakness. Mentally back to normal. Spoke with fiancee.

## 2012-03-15 NOTE — Anesthesia Postprocedure Evaluation (Signed)
Anesthesia Post Note  Patient: Darin Gomez  Procedure(s) Performed: Procedure(s) (LRB): LUMBAR LAMINECTOMY/DECOMPRESSION MICRODISCECTOMY 2 LEVELS (Bilateral)  Anesthesia type: general  Patient location: PACU  Post pain: Pain level controlled  Post assessment: Patient's Cardiovascular Status Stable  Last Vitals:  Filed Vitals:   03/15/12 1430  BP:   Pulse: 53  Temp:   Resp: 14    Post vital signs: Reviewed and stable  Level of consciousness: sedated  Complications: No apparent anesthesia complications

## 2012-03-15 NOTE — Anesthesia Preprocedure Evaluation (Addendum)
Anesthesia Evaluation  Patient identified by MRN, date of birth, ID band Patient awake    Reviewed: Allergy & Precautions, H&P , NPO status , Patient's Chart, lab work & pertinent test results, reviewed documented beta blocker date and time   History of Anesthesia Complications (+) PONV  Airway Mallampati: II TM Distance: >3 FB Neck ROM: Full    Dental  (+) Teeth Intact, Poor Dentition and Dental Advisory Given   Pulmonary former smoker,          Cardiovascular hypertension, Pt. on medications     Neuro/Psych    GI/Hepatic   Endo/Other    Renal/GU      Musculoskeletal   Abdominal   Peds  Hematology   Anesthesia Other Findings   Reproductive/Obstetrics                           Anesthesia Physical Anesthesia Plan  ASA: II  Anesthesia Plan: General   Post-op Pain Management:    Induction: Intravenous  Airway Management Planned: Oral ETT  Additional Equipment:   Intra-op Plan:   Post-operative Plan: Extubation in OR  Informed Consent: I have reviewed the patients History and Physical, chart, labs and discussed the procedure including the risks, benefits and alternatives for the proposed anesthesia with the patient or authorized representative who has indicated his/her understanding and acceptance.   Dental advisory given  Plan Discussed with: CRNA and Surgeon  Anesthesia Plan Comments:        Anesthesia Quick Evaluation

## 2012-03-15 NOTE — H&P (Signed)
Darin Gomez is an 61 y.o. male.   Chief Complaint:bilateral leg pain HPI: patient underwent lumbar laminectomies for a classic presentation of cauda equina syndrome. He has been doing fairly well but latet Ly he has increase pain in lower extremities as well as increase of urinary frequency.pt has helped up to a point. Had a lumbar myelogram and d Because of fndings he is been admitted to surgery  Past Medical History  Diagnosis Date  . PONV (postoperative nausea and vomiting)   . Heart murmur     as child....Marland Kitchenno problems now  . Headache     occas.  migraines  (from last surgery)  . Arthritis     mainly in back  . Hypertension     dx  10 yrs or more  ago    Past Surgical History  Procedure Date  . Back surgery   . Fracture surgery     left arm    History reviewed. No pertinent family history. Social History:  reports that he has quit smoking. His smokeless tobacco use includes Chew. He reports that he drinks alcohol. He reports that he does not use illicit drugs.  Allergies:  Allergies  Allergen Reactions  . Keppra (Levetiracetam) Nausea And Vomiting  . Valium (Diazepam) Other (See Comments)    Significant depression; "a different person"    Medications Prior to Admission  Medication Sig Dispense Refill  . aspirin 81 MG tablet Take 81 mg by mouth daily.      . baclofen (LIORESAL) 20 MG tablet Take 20 mg by mouth 3 (three) times daily.      . cetirizine (ZYRTEC) 10 MG tablet Take 10 mg by mouth daily as needed. For allergies      . Cholecalciferol (VITAMIN D) 2000 UNITS CAPS Take 2,000 Units by mouth daily.      Marland Kitchen docusate sodium (COLACE) 100 MG capsule Take 200 mg by mouth daily.      Marland Kitchen lisinopril-hydrochlorothiazide (PRINZIDE,ZESTORETIC) 20-12.5 MG per tablet Take 1 tablet by mouth daily.      . Melatonin 10 MG CAPS Take 10 mg by mouth at bedtime.      Marland Kitchen morphine (MSIR) 30 MG tablet Take 30 mg by mouth 2 times daily at 12 noon and 4 pm.      . oxyCODONE  (ROXICODONE) 15 MG immediate release tablet Take 15 mg by mouth every 4 (four) hours as needed. For break through pain      . pravastatin (PRAVACHOL) 40 MG tablet Take 40 mg by mouth 2 (two) times daily.      . psyllium (METAMUCIL) 58.6 % powder Take 1 packet by mouth daily.        Results for orders placed during the hospital encounter of 03/14/12 (from the past 48 hour(s))  SURGICAL PCR SCREEN     Status: Normal   Collection Time   03/14/12 12:53 PM      Component Value Range Comment   MRSA, PCR NEGATIVE  NEGATIVE    Staphylococcus aureus NEGATIVE  NEGATIVE   BASIC METABOLIC PANEL     Status: Abnormal   Collection Time   03/14/12 12:54 PM      Component Value Range Comment   Sodium 137  135 - 145 mEq/L    Potassium 3.7  3.5 - 5.1 mEq/L    Chloride 101  96 - 112 mEq/L    CO2 30  19 - 32 mEq/L    Glucose, Bld 103 (*) 70 - 99 mg/dL  BUN 15  6 - 23 mg/dL    Creatinine, Ser 1.61  0.50 - 1.35 mg/dL    Calcium 9.4  8.4 - 09.6 mg/dL    GFR calc non Af Amer 87 (*) >90 mL/min    GFR calc Af Amer >90  >90 mL/min   CBC     Status: Normal   Collection Time   03/14/12 12:54 PM      Component Value Range Comment   WBC 8.1  4.0 - 10.5 K/uL    RBC 5.17  4.22 - 5.81 MIL/uL    Hemoglobin 15.7  13.0 - 17.0 g/dL    HCT 04.5  40.9 - 81.1 %    MCV 87.8  78.0 - 100.0 fL    MCH 30.4  26.0 - 34.0 pg    MCHC 34.6  30.0 - 36.0 g/dL    RDW 91.4  78.2 - 95.6 %    Platelets 237  150 - 400 K/uL    Dg Chest 2 View  03/14/2012  *RADIOLOGY REPORT*  Clinical Data: Preoperative evaluation  CHEST - 2 VIEW  Comparison: Chest x-ray 11/19/2010.  Findings: Lung volumes are low.  No consolidative airspace disease. No pleural effusions.  No pneumothorax.  No pulmonary nodule or mass noted.  Pulmonary vasculature and the cardiomediastinal silhouette are within normal limits.  IMPRESSION: 1. No radiographic evidence of acute cardiopulmonary disease.   Original Report Authenticated By: Florencia Reasons, Gomez.D.      Review of Systems  Constitutional: Negative.   HENT: Negative.   Eyes: Negative.   Respiratory: Negative.   Cardiovascular: Negative.   Gastrointestinal: Negative.   Genitourinary: Positive for urgency and frequency.  Musculoskeletal: Positive for back pain.  Skin: Negative.   Neurological: Positive for sensory change and focal weakness.  Endo/Heme/Allergies: Negative.   Psychiatric/Behavioral: Negative.   bowel incontinency  Blood pressure 113/74, pulse 60, temperature 98.1 F (36.7 C), temperature source Oral, resp. rate 18, SpO2 96.00%. Physical Exam hent, nl. Neck, nl. Lungs, clear. Cv, nl. Abdomen, nl. Extremoties,nl.NEURO SLR positive at 60 degrees. Femoral stretch positive both sides.   Assessment/Plan Recurrent decompression at the l3 to l5s1 spaces. He,fiancee and daughter are aware of results and risks.  Darin Gomez 03/15/2012, 8:41 AM

## 2012-03-15 NOTE — Preoperative (Signed)
Beta Blockers   Reason not to administer Beta Blockers:Not Applicable 

## 2012-03-15 NOTE — Progress Notes (Signed)
Op note [id number]. sspoke with family  About findings

## 2012-03-15 NOTE — OR Nursing (Signed)
In and out cath at end of procedure with 300 ml clear amber urine returned.

## 2012-03-15 NOTE — Clinical Social Work Note (Signed)
Clinical Social Work  CSW received consult for SNF. Awaiting PT/OT discharge recommendations. CSW will continue to follow.   Dede Query, MSW, Theresia Majors [phone]

## 2012-03-15 NOTE — Progress Notes (Signed)
Pt. Calmer, sleeping at times

## 2012-03-15 NOTE — Progress Notes (Signed)
Pt. Trying to sit up and turn, aware of need to lye flat..the patient still moving around on stretcher.pt. Screaming in pain, DR. Ossey at bedside.

## 2012-03-15 NOTE — Progress Notes (Signed)
Pt. With 2 of Dilaudid, 1 of Versed and still yelling, Dr. Michelle Piper aware and new orders noted,

## 2012-03-16 ENCOUNTER — Encounter (HOSPITAL_COMMUNITY): Payer: Self-pay | Admitting: Neurosurgery

## 2012-03-16 LAB — CBC
HCT: 33.2 % — ABNORMAL LOW (ref 39.0–52.0)
MCH: 29.9 pg (ref 26.0–34.0)
MCHC: 34 g/dL (ref 30.0–36.0)
MCV: 87.8 fL (ref 78.0–100.0)
RDW: 13 % (ref 11.5–15.5)

## 2012-03-16 MED ORDER — MAGNESIUM HYDROXIDE 400 MG/5ML PO SUSP
15.0000 mL | Freq: Every day | ORAL | Status: DC | PRN
  Administered 2012-03-16 – 2012-03-25 (×3): 15 mL via ORAL
  Filled 2012-03-16 (×5): qty 30

## 2012-03-16 MED ORDER — METHOCARBAMOL 500 MG PO TABS
500.0000 mg | ORAL_TABLET | Freq: Four times a day (QID) | ORAL | Status: DC | PRN
  Administered 2012-03-16 – 2012-03-21 (×7): 500 mg via ORAL
  Filled 2012-03-16 (×6): qty 1

## 2012-03-16 MED ORDER — METHOCARBAMOL 100 MG/ML IJ SOLN
500.0000 mg | Freq: Four times a day (QID) | INTRAVENOUS | Status: DC | PRN
  Filled 2012-03-16: qty 5

## 2012-03-16 MED ORDER — METHOCARBAMOL 100 MG/ML IJ SOLN
500.0000 mg | Freq: Four times a day (QID) | INTRAVENOUS | Status: DC | PRN
  Administered 2012-03-16 – 2012-03-20 (×7): 500 mg via INTRAVENOUS
  Filled 2012-03-16 (×8): qty 5

## 2012-03-16 NOTE — Progress Notes (Signed)
MD came by, was told that pts BP are low, MD says as long as BP is above 80 SBP, it is okay, MD ordered CBC

## 2012-03-16 NOTE — Op Note (Signed)
NAMEJOHNTE, PORTNOY NO.:  [id number]  MEDICAL RECORD NO.:  [id number]  LOCATION:  4N21C                        FACILITY:  [facility]  PHYSICIAN:  Hilda Lias, M.D.   DATE OF BIRTH:  Jun 04, 1950  DATE OF PROCEDURE:  03/15/2012 DATE OF DISCHARGE:                              OPERATIVE REPORT   PREOPERATIVE DIAGNOSES:  Recurrent lumbar stenosis with neurogenic claudication and neurogenic bladder and beginning of cauda equina syndrome.  POSTOPERATIVE DIAGNOSIS:  Recurrent lumbar stenosis with neurogenic claudication and neurogenic bladder and beginning of cauda equina syndrome.  PROCEDURE:  Redo of the L3, L4, and L5 laminectomy involving also part of the L2.  Lysis of adhesions.  Foraminotomy.  Cell Saver.  SURGEON:  Hilda Lias, M.D.  ASSISTANT:  Cristi Loron, M.D.  CLINICAL HISTORY:  Mr. Gaughan is a gentleman who last time underwent lumbar laminectomy because of clinical presentation of cauda equina syndrome.  The patient did well, but lately he had been complaining of more back pain with some urinary frequency.  X-rays showed although we accomplished good decompression of the lumbar spine nevertheless the area became acidotic all over again.  I talked to him, his wife, and daughter, and finally we agreed with surgery.  The risk of the surgery was: 1. No improvement. 2. Infection. 3. CSF leak.  The patient was taken to the OR, and after intubation, he was positioned in a prone manner.  The back was cleaned with DuraPrep.  Midline incision resecting the previous scar was made through the skin, subcutaneous tissue straight down to the upper part of the spine. Muscle retracted laterally.  Then we decided to remove the upper lamina as well as the spinous process that will be L3 or L2.  Then, we found area of stenosis between that area and the beginning of the previous surgery with a scar, which was removed.  Then with the drill all the  way laterally until we were half wide margins in the canal.  Then with the work in the right side, I removed the scar tissue to be able to get into the foramen and the lateral aspect of the dura mater.  The thecal sac was dissected by foramen and at the end, we had a good decompression from the L2, L3, L4, L5, and S1 nerve root.  The same procedure was done on the right side where there were more adhesions mostly in the right side between 3-4 and 4-5.  During the procedure, the patient has a tendency to bleed more than normal.  We decided to use a Cell Saver machine.  X-rays were taken, which showed that indeed we were in the area that we were looking for.  There was an area around L3 mostly the dorsolaterally where there was some CSF fluid coming, mostly coming right underneath from the adhesion.  Then using 4-0 Nurolon, I was able to close the area with 2 stitches and piece of muscle on top.  Finally we went down to the lower part of the spine and the foramina were opened.  At the end, although we had a good decompression of the thecal sac nevertheless the posterolateral aspect mostly in the  right side was quite irregular secondary to the multiple scar tissue.  Finally before we closed, we did a Valsalva maneuver that was negative.  We probed all the foramen and they were wide open.  Because of the amount of bleeding, we decided to leave a Hemovac.  The was closed with 5 different layers of Vicryl and nylon for the skin.  At the end of the procedure, it took [place] at least 40 minutes for the patient to wake up.  He did not want open his eyes, he did not want to move, he did not want to talk at all. Finally after some oral encouragement, he was able to open his eyes and move his legs.  Then he was transferred to the PACU.  After this procedure, I talked to the family and psychologically he was worried he was going to die in surgery at the end of the case.  He is going to be seen by physical  therapy as soon as he goes to the floor.          ______________________________ Hilda Lias, M.D.     EB/MEDQ  D:  03/15/2012  T:  03/16/2012  Job:  [id number]

## 2012-03-16 NOTE — Progress Notes (Signed)
Pt voided using the urinal while laying flat. Bladder scanned pt to check for urine retention and only got 80 ml max. Called Dr. Jeral Fruit to update him on pt's status and was told to keep pt flat bedrest until MD sees him tomorrow.  Pt still refuses a foley and wants to continue to void flat with urinal.  Will continue to monitor. Vianney Kopecky, [place] Marie, RN

## 2012-03-16 NOTE — Progress Notes (Signed)
Was able to ambulate with physical therapist but at the end he developed intense headache and lumbar and muscle spasm. No weakness. Although he was having quite a bit of drainage no csf was seen. Nevertheless i did remove the hemovac to prevent a csf leak. He will be in bed rest and change the diazepam to robaxin. I am on call today and i will follow up him quite close to see for any evidence of epidural hematoma. i did speak with him and fiancee.

## 2012-03-16 NOTE — Evaluation (Signed)
Physical Therapy Evaluation Patient Details Name: Darin Gomez MRN: [id number] DOB: 04-11-1951 Today's Date: 03/16/2012 Time: 4098-1191 PT Time Calculation (min): 32 min  PT Assessment / Plan / Recommendation Clinical Impression  Pt s/p lami/disectomy. Pt has had previous back surgeries and is fairly safe with back precautions although still requires cueing throughout to maintain and avoid twisting. Pt also with bilateral LE numbness from previous surgery requiring increased assistance for balance. Recommend OPPT for high level balance.  Pt will benefit from skilled PT in the acute care setting in order to increase safety and functional mobility prior to d/c home    PT Assessment  Patient needs continued PT services    Follow Up Recommendations  Outpatient PT;Supervision for mobility/OOB    Does the patient have the potential to tolerate intense rehabilitation      Barriers to Discharge        Equipment Recommendations  None recommended by PT    Recommendations for Other Services     Frequency Min 5X/week    Precautions / Restrictions Precautions Precautions: Back Precaution Booklet Issued: Yes (comment) Precaution Comments: pt educated on 3/3 back precautions Required Braces or Orthoses: Spinal Brace Spinal Brace: Lumbar corset;Applied in sitting position Restrictions Weight Bearing Restrictions: No   Pertinent Vitals/Pain Pt with spasms causing 8/10 pain during mobility. RN aware.       Mobility  Bed Mobility Bed Mobility: Rolling Left;Left Sidelying to Sit;Sitting - Scoot to [place] of Bed Rolling Left: 4: Min guard Left Sidelying to Sit: 4: Min guard Sitting - Scoot to [place] of Bed: 4: Min guard Details for Bed Mobility Assistance: VC for proper sequencing to maintain back precautuions and most likely avoid twisting while turning Transfers Transfers: Sit to Stand;Stand to Sit Sit to Stand: 4: Min guard;With upper extremity assist;From bed Stand to Sit: 4: Min  guard;With upper extremity assist;To chair/3-in-1 Details for Transfer Assistance: VC for proper hand placement and safety to avoid twisting while sitting/standing Ambulation/Gait Ambulation/Gait Assistance: 4: Min guard Ambulation Distance (Feet): 300 Feet Assistive device: Rolling walker Ambulation/Gait Assistance Details: Cues for safe distance and proper technique with RW. Pt with one loss of balacne requiring min assist to maintain upright posture. Discussed safety with AD upon d/c. Pt with muscle spasms throughout ambulation, RN aware.  Gait Pattern: Step-to pattern;Antalgic Gait velocity: decreased gait speed Stairs: No    Shoulder Instructions     Exercises     PT Diagnosis: Difficulty walking;Acute pain  PT Problem List: Decreased activity tolerance;Decreased balance;Decreased mobility;Decreased knowledge of use of DME;Decreased safety awareness;Decreased knowledge of precautions;Impaired sensation;Pain PT Treatment Interventions: DME instruction;Gait training;Stair training;Functional mobility training;Therapeutic activities;Balance training;Patient/family education   PT Goals Acute Rehab PT Goals PT Goal Formulation: With patient Time For Goal Achievement: 03/23/12 Potential to Achieve Goals: Good Pt will Roll Supine to Right Side: with modified independence (without cues for safety) PT Goal: Rolling Supine to Right Side - Progress: Goal set today Pt will Roll Supine to Left Side: with modified independence (without cues for safety) PT Goal: Rolling Supine to Left Side - Progress: Goal set today Pt will go Supine/Side to Sit: with modified independence PT Goal: Supine/Side to Sit - Progress: Goal set today Pt will go Sit to Supine/Side: with modified independence PT Goal: Sit to Supine/Side - Progress: Goal set today Pt will go Sit to Stand: with modified independence PT Goal: Sit to Stand - Progress: Goal set today Pt will go Stand to Sit: with modified independence PT  Goal: Stand  to Sit - Progress: Goal set today Pt will Transfer Bed to Chair/Chair to Bed: with modified independence PT Transfer Goal: Bed to Chair/Chair to Bed - Progress: Goal set today Pt will Ambulate: >150 feet;with modified independence;with least restrictive assistive device PT Goal: Ambulate - Progress: Goal set today Pt will Go Up / Down Stairs: 1-2 stairs;with supervision;with rail(s) PT Goal: Up/Down Stairs - Progress: Goal set today  Visit Information  Last PT Received On: 03/16/12 Assistance Needed: +1 PT/OT Co-Evaluation/Treatment: Yes    Subjective Data  Patient Stated Goal: to decrease spasms   Prior Functioning  Home Living Lives With: Spouse Available Help at Discharge: Family;Available 24 hours/day Type of Home: House Home Access: Stairs to enter [employer] of Steps: 2 Entrance Stairs-Rails: Right;Left Home Layout: One level Bathroom Shower/Tub: Forensic scientist: Standard Bathroom Accessibility: Yes How Accessible: Accessible via walker Home Adaptive Equipment: Other (comment);Shower chair with back;Walker - rolling;Straight cane (raiser on toilet) Prior Function Level of Independence: Independent Able to Take Stairs?: Yes Driving: Yes Vocation: Full time employment Comments: deskwork Communication Communication: No difficulties Dominant Hand: Right    Cognition  Overall Cognitive Status: Appears within functional limits for tasks assessed/performed Arousal/Alertness: Awake/alert Orientation Level: Appears intact for tasks assessed Behavior During Session: [facility] for tasks performed    Extremity/Trunk Assessment Right Lower Extremity Assessment RLE ROM/Strength/Tone: Within functional levels RLE Sensation: WFL - Light Touch;Deficits RLE Sensation Deficits: decreased sensation on feet Left Lower Extremity Assessment LLE ROM/Strength/Tone: Within functional levels LLE Sensation: WFL - Light Touch;Deficits LLE  Sensation Deficits: decreased sensation on feet   Balance    End of Session PT - End of Session Equipment Utilized During Treatment: Gait belt Activity Tolerance: Patient tolerated treatment well Patient left: in chair;with call bell/phone within reach;with family/visitor present Nurse Communication: Mobility status    Milana Kidney 03/16/2012, 11:29 AM  03/16/2012 Milana Kidney DPT PAGER: [phone] OFFICE: [phone]

## 2012-03-16 NOTE — Evaluation (Signed)
Occupational Therapy Evaluation Patient Details Name: Darin Gomez MRN: [id number] DOB: 06-12-1950 Today's Date: 03/16/2012 Time: 1018-1100 (back in the room to help pt back to bed due to headache) OT Time Calculation (min): 42 min  OT Assessment / Plan / Recommendation Clinical Impression  This 61 yo male s/p back fusion presents to acute OT with problems below. Will benefit from acute OT without need for follow up.    OT Assessment  Patient needs continued OT Services    Follow Up Recommendations  No OT follow up    Barriers to Discharge None    Equipment Recommendations  None recommended by OT;None recommended by PT       Frequency  Min 2X/week    Precautions / Restrictions Precautions Precautions: Back Precaution Booklet Issued: Yes (comment) Precaution Comments: pt educated on 3/3 back precautions Required Braces or Orthoses: Spinal Brace Spinal Brace: Lumbar corset;Applied in sitting position Restrictions Weight Bearing Restrictions: No   Pertinent Vitals/Pain 4/10 at rest 10/10 in back with activity (spasms)    ADL  Eating/Feeding: Simulated;Independent Where Assessed - Eating/Feeding: Chair Grooming: Simulated;Set up Where Assessed - Grooming: Unsupported sitting Upper Body Bathing: Simulated;Set up Where Assessed - Upper Body Bathing: Supported sitting Lower Body Bathing: Simulated;Maximal assistance Where Assessed - Lower Body Bathing: Supported sit to stand Upper Body Dressing: Simulated;Minimal assistance Where Assessed - Upper Body Dressing: Unsupported sitting Lower Body Dressing: Simulated;+1 Total assistance Where Assessed - Lower Body Dressing: Supported sit to stand Toilet Transfer: Mining engineer Method: Sit to Barista:  (Bed, around 1/2 unit, back to room to recliner) Toileting - Clothing Manipulation and Hygiene: Simulated;Moderate assistance Where Assessed - Toileting Clothing  Manipulation and Hygiene: Standing Transfers/Ambulation Related to ADLs: Min A sit to stand and stand to sit as well as ambulation    OT Diagnosis: Generalized weakness;Acute pain  OT Problem List: Decreased activity tolerance;Impaired balance (sitting and/or standing);Decreased knowledge of use of DME or AE;Decreased knowledge of precautions;Pain OT Treatment Interventions: Self-care/ADL training;DME and/or AE instruction;Patient/family education;Balance training   OT Goals Acute Rehab OT Goals OT Goal Formulation: With patient Time For Goal Achievement: 03/23/12 Potential to Achieve Goals: Good ADL Goals Pt Will Perform Lower Body Bathing: with set-up;Unsupported;Sit to stand from chair;Sit to stand from bed;with adaptive equipment (or caregiver may A him) ADL Goal: Lower Body Bathing - Progress: Goal set today Pt Will Perform Lower Body Dressing: with set-up;Sit to stand from chair;Sit to stand from bed;Unsupported;with adaptive equipment (or caregiver may A him) ADL Goal: Lower Body Dressing - Progress: Goal set today Pt Will Transfer to Toilet: with modified independence;Ambulation;with DME;Comfort height toilet;Grab bars ADL Goal: Toilet Transfer - Progress: Goal set today Pt Will Perform Toileting - Clothing Manipulation: with modified independence;Standing ADL Goal: Toileting - Clothing Manipulation - Progress: Goal set today Pt Will Perform Toileting - Hygiene: with modified independence;Sit to stand from 3-in-1/toilet ADL Goal: Toileting - Hygiene - Progress: Goal set today Pt Will Perform Tub/Shower Transfer: with supervision;Ambulation;with DME;Shower seat with back;Maintaining back safety precautions ADL Goal: Web designer - Progress: Goal set today Miscellaneous OT Goals Miscellaneous OT Goal #1: Pt will be able to state 3/3 back precautions OT Goal: Miscellaneous Goal #1 - Progress: Goal set today  Visit Information  Last OT Received On: 03/16/12 Assistance  Needed: +1 PT/OT Co-Evaluation/Treatment: Yes    Subjective Data  Patient Stated Goal: Get these spasms under control   Prior Functioning     Home Living Lives With: Spouse Available  Help at Discharge: Family;Available 24 hours/day Type of Home: House Home Access: Stairs to enter [employer] of Steps: 2 Entrance Stairs-Rails: Right;Left Home Layout: One level Bathroom Shower/Tub: Forensic scientist: Standard Bathroom Accessibility: Yes How Accessible: Accessible via walker Home Adaptive Equipment: Other (comment);Shower chair with back;Walker - rolling;Straight cane (raiser on toilet) Prior Function Level of Independence: Independent Able to Take Stairs?: Yes Driving: Yes Vocation: Full time employment Comments: deskwork Communication Communication: No difficulties Dominant Hand: Right            Cognition  Overall Cognitive Status: Appears within functional limits for tasks assessed/performed Arousal/Alertness: Awake/alert Orientation Level: Appears intact for tasks assessed Behavior During Session: [facility] for tasks performed    Extremity/Trunk Assessment Right Lower Extremity Assessment RLE ROM/Strength/Tone: Within functional levels RLE Sensation: WFL - Light Touch;Deficits RLE Sensation Deficits: decreased sensation on feet Left Lower Extremity Assessment LLE ROM/Strength/Tone: Within functional levels LLE Sensation: WFL - Light Touch;Deficits LLE Sensation Deficits: decreased sensation on feet     Mobility Bed Mobility Bed Mobility: Sit to Sidelying Left Rolling Left: 4: Min guard Left Sidelying to Sit: 4: Min guard Sitting - Scoot to Edge of Bed: 4: Min guard Sit to Sidelying Left: 4: Min guard;With rail;HOB flat Details for Bed Mobility Assistance: VC for proper sequencing to maintain back precautuions and most likely avoid twisting while turning Transfers Sit to Stand: 4: Min guard;With upper extremity assist;From  bed Stand to Sit: 4: Min guard;With upper extremity assist;To chair/3-in-1 Details for Transfer Assistance: VC for proper hand placement and safety to avoid twisting while sitting/standing              End of Session OT - End of Session Equipment Utilized During Treatment: Gait belt;Back brace (RW) Activity Tolerance: Patient limited by pain (spasms) Patient left: in bed Nurse Communication: Mobility status (+1 A with RW)       Evette Georges [phone] 03/16/2012, 11:37 AM

## 2012-03-16 NOTE — Progress Notes (Signed)
Referral received for SNF. Chart reviewed and CSW reviewed PT/OT evals which indicates that patient is for DC to home with outpatient PT.   CSW to sign off. Please re-consult if CSW needs arise.  Lorri Frederick. [place]  [phone]

## 2012-03-16 NOTE — Progress Notes (Signed)
Wound dry. Stood bedsides to urinate and had some headache. i did offer to insert a foley and keep him flat but he wants to urinate on his own while flat. i will check on him tonite

## 2012-03-16 NOTE — Progress Notes (Signed)
Voided on his own. Wound dry. BP and pulse fluctuating. Clinically stable. To get a cbc tonite. Tomorrow will change analgesia.

## 2012-03-17 MED FILL — Sodium Chloride Irrigation Soln 0.9%: Qty: 3000 | Status: AC

## 2012-03-17 MED FILL — Heparin Sodium (Porcine) Inj 1000 Unit/ML: INTRAMUSCULAR | Qty: 30 | Status: AC

## 2012-03-17 MED FILL — Sodium Chloride IV Soln 0.9%: INTRAVENOUS | Qty: 1000 | Status: AC

## 2012-03-17 NOTE — Progress Notes (Signed)
[name], male   DOB: [birth year], 61 y.o.   MRN: [id number] Better, minimal drainage and headache. Voiding well. Agree with bed rest till tomorrow. No weakness

## 2012-03-17 NOTE — Progress Notes (Signed)
OT Cancellation Note  Patient Details Name: Darin Gomez MRN: [id number] DOB: 1950-09-26   Cancelled Treatment:     Due to pt on bedrest at least until tomorrow.  Evette Georges [phone] 03/17/2012, 1:52 PM

## 2012-03-17 NOTE — Progress Notes (Signed)
PT Cancellation Note  Patient Details Name: Darin Gomez MRN: [id number] DOB: [birth year]   Cancelled Treatment:    Reason Eval/Treat Not Completed: Medical issues which prohibited therapy (patient on bedrest)   Fredrich Birks 03/17/2012, 11:50 AM

## 2012-03-18 MED ORDER — HYDROMORPHONE HCL 2 MG PO TABS
4.0000 mg | ORAL_TABLET | Freq: Four times a day (QID) | ORAL | Status: DC | PRN
  Administered 2012-03-18 – 2012-03-21 (×11): 4 mg via ORAL
  Filled 2012-03-18 (×11): qty 2

## 2012-03-18 MED ORDER — NICOTINE 14 MG/24HR TD PT24
14.0000 mg | MEDICATED_PATCH | TRANSDERMAL | Status: DC
  Administered 2012-03-19 – 2012-03-25 (×7): 14 mg via TRANSDERMAL
  Filled 2012-03-18 (×8): qty 1

## 2012-03-18 MED ORDER — WHITE PETROLATUM GEL
Status: AC
  Administered 2012-03-18: 04:00:00
  Filled 2012-03-18: qty 5

## 2012-03-18 MED ORDER — DEXTROSE 5 % IV SOLN
1000.0000 mg | Freq: Four times a day (QID) | INTRAVENOUS | Status: AC
  Administered 2012-03-18 (×4): 1000 mg via INTRAVENOUS
  Filled 2012-03-18 (×5): qty 10

## 2012-03-18 NOTE — Progress Notes (Signed)
03/18/2012 Primo Innis Lynn DPT PAGER: [phone] OFFICE: [phone]   

## 2012-03-18 NOTE — Progress Notes (Signed)
Was to be flat but HOB elevated last nite and since then headache got worse and now he has some csf leak. i will keep him flat and use dermabond to cover wound

## 2012-03-18 NOTE — Progress Notes (Signed)
[name], male   DOB: 02/21/1951, 61 y.o.   MRN: [id number] dermabond applied to the incision. Continues to be flat

## 2012-03-18 NOTE — Progress Notes (Signed)
[name], male   DOB: 1951-01-08, 61 y.o.   MRN: [id number] Subjective: Patient reports continued headaches, back pain, buttocks and leg cramps.  Objective: Vital signs in last 24 hours: Temp:  [98 F (36.7 C)-98.9 F (37.2 C)] 98.4 F (36.9 C) (10/19 0635) Pulse Rate:  [73-86] 74  (10/19 0635) Resp:  [18-20] 18  (10/19 0635) BP: (83-121)/(44-63) 121/60 mmHg (10/19 0635) SpO2:  [91 %-100 %] 98 % (10/19 0635)  Intake/Output from previous day: 10/18 0701 - 10/19 0700 In: 3 [I.V.:3] Out: 1600 [Urine:1600] Intake/Output this shift:    Neurologic: Grossly normal Incision somewhat swollen but no active leaking noted  Lab Results: Lab Results  Component Value Date   WBC 10.9* 03/16/2012   HGB 11.3* 03/16/2012   HCT 33.2* 03/16/2012   MCV 87.8 03/16/2012   PLT 191 03/16/2012   Lab Results  Component Value Date   INR 1.06 12/05/2010   BMET Lab Results  Component Value Date   NA 137 03/14/2012   K 3.7 03/14/2012   CL 101 03/14/2012   CO2 30 03/14/2012   GLUCOSE 103* 03/14/2012   BUN 15 03/14/2012   CREATININE 0.99 03/14/2012   CALCIUM 9.4 03/14/2012    Studies/Results: No results found.  Assessment/Plan: Flat bedrest and pain control for 48 hours. Unfortunately this patient has been allowed to have had up at times, and he appears symptomatic from this.   LOS: 3 days    Shavanna Furnari S 03/18/2012, 9:46 AM

## 2012-03-18 NOTE — Progress Notes (Signed)
Pt complains of severe spasm on his back. MD on-call (Dr. Wynetta Emery) aware and ordered Robaxin 1gm IV every 6 hours x 4 doses and ice packs on his back. Will continue to monitor patient.

## 2012-03-18 NOTE — Progress Notes (Addendum)
OT Cancellation Note  Patient Details Name: Darin Gomez MRN: [id number] DOB: 08/20/1950   Cancelled Treatment:    Reason Eval/Treat Not Completed: Medical issues which prohibited therapy. Pt on BR. Will check back as schedule allows. Thank you. [place]endale Chard, OTR/L Pager: [phone] 03/18/2012    [name] 03/18/2012, 9:59 AM

## 2012-03-19 MED ORDER — ALUM & MAG HYDROXIDE-SIMETH 200-200-20 MG/5ML PO SUSP
30.0000 mL | ORAL | Status: DC | PRN
  Administered 2012-03-19: 30 mL via ORAL
  Filled 2012-03-19 (×2): qty 30

## 2012-03-19 NOTE — Progress Notes (Signed)
Subjective: Patient reports Please feeling okay without significant headache denies any drainage on the incision  Objective: Vital signs in last 24 hours: Temp:  [98 F (36.7 C)-98.7 F (37.1 C)] 98.7 F (37.1 C) (10/20 0602) Pulse Rate:  [65-83] 67  (10/20 0602) Resp:  [18] 18  (10/20 0602) BP: (115-123)/(46-70) 122/49 mmHg (10/20 0602) SpO2:  [96 %-100 %] 100 % (10/20 0602)  Intake/Output from previous day: 10/19 0701 - 10/20 0700 In: 1158 [P.O.:1100; I.V.:3; IV Piggyback:55] Out: 925 [Urine:925] Intake/Output this shift:    Wound appears dry patient remains flat  Lab Results:  Basename 03/16/12 2120  WBC 10.9*  HGB 11.3*  HCT 33.2*  PLT 191   BMET No results found for this basename: NA:2,K:2,CL:2,CO2:2,GLUCOSE:2,BUN:2,CREATININE:2,CALCIUM:2 in the last 72 hours  Studies/Results: No results found.  Assessment/Plan: Continue flat bedrest  LOS: 4 days     Kevion Fatheree P 03/19/2012, 8:41 AM

## 2012-03-20 ENCOUNTER — Other Ambulatory Visit (HOSPITAL_COMMUNITY): Payer: 59

## 2012-03-20 MED ORDER — DOCUSATE SODIUM 100 MG PO CAPS
200.0000 mg | ORAL_CAPSULE | Freq: Every day | ORAL | Status: DC
  Administered 2012-03-20 – 2012-03-26 (×7): 200 mg via ORAL
  Filled 2012-03-20 (×4): qty 2
  Filled 2012-03-20: qty 1
  Filled 2012-03-20 (×2): qty 2

## 2012-03-20 NOTE — Progress Notes (Signed)
OT Cancellation Note  Patient Details Name: Darin Gomez MRN: [id number] DOB: 1950-08-15   Cancelled Treatment:     Pt's activity orders remain flat bedrest.  Will continue to follow and proceed when appropriate.  Evern Bio 03/20/2012, 8:37 AM [phone]

## 2012-03-20 NOTE — Plan of Care (Signed)
Problem: Consults Goal: Diagnosis - Spinal Surgery Outcome: Completed/Met Date Met:  03/20/12 Lumbar Laminectomy (Complex)

## 2012-03-20 NOTE — Progress Notes (Signed)
   CARE MANAGEMENT NOTE 03/20/2012  Patient:  Darin Gomez, Darin Gomez   Account Number:  [id number]  Date Initiated:  03/20/2012  Documentation initiated by:  [facility]  Subjective/Objective Assessment:   Redo of the L3, L4, and L5 laminectomy involving also part  of the L2.  Lysis of adhesions.  Foraminotomy     Action/Plan:   lives at home with wife, Darin Gomez [phone], [phone]   Anticipated DC Date:  03/24/2012   Anticipated DC Plan:  HOME W HOME HEALTH SERVICES      DC Planning Services  CM consult      [facility] Choice  HOME HEALTH   Choice offered to / List presented to:  C-1 Patient           [facility] agency  [facility]   Status of service:  In process, will continue to follow Medicare Important Message given?   (If response is "NO", the following Medicare IM given date fields will be blank) Date Medicare IM given:   Date Additional Medicare IM given:    Discharge Disposition:    Per UR Regulation:    If discussed at Long Length of Stay Meetings, dates discussed:    Comments:  03/20/2012 1200 NCM spoke to pt and states he had [facility] in the past. Has all his DME at home (RW, reacher, and elevated toilet seat). Will wait PT/OT recommendations. Pt unable to do PT/OT at this time due to spinal leak. NCM will continue to follow up for d/c needs. Isidoro Donning RN CCM Case Mgmt phone [phone]

## 2012-03-20 NOTE — Progress Notes (Signed)
[name], male   DOB: [birth year], 61 y.o.   MRN: [id number] Seen this morning. Although trere is an order to be flat on bed MR xylan sheils was telling me that yesterday she found e nurses trying to lift him with the hob UP. HE HAS SOME MINOR LEAKAGE YESTERDAY BUT TODAY IS DRY WITH NO HEADACHE. I WILL USE MORE DERMEBOND AND SEE HOW HE IS IN AM.

## 2012-03-20 NOTE — Progress Notes (Signed)
Pt. Alert and oriented. Incision was leaking and dermabond was applied to incision. Incision area clean, dry and intact with dressing over dermabond.  MD aware. RN will continue to monitor.

## 2012-03-20 NOTE — Progress Notes (Signed)
PT Cancellation Note  Patient Details Name: Darin Gomez MRN: [id number] DOB: 1951/04/09   Cancelled Treatment:    Reason Eval/Treat Not Completed: Medical issues which prohibited therapy (Patient remains on strict flat bedrest.)  Will continue to monitor.   Vena Austria 03/20/2012, 11:14 AM [phone]

## 2012-03-21 MED ORDER — DIPHENHYDRAMINE HCL 12.5 MG/5ML PO ELIX: 12.5000 mg | ORAL_SOLUTION | Freq: Four times a day (QID) | ORAL | Status: DC | PRN

## 2012-03-21 MED ORDER — NALOXONE HCL 0.4 MG/ML IJ SOLN: 0.4000 mg | INTRAMUSCULAR | Status: DC | PRN

## 2012-03-21 MED ORDER — ONDANSETRON HCL 4 MG/2ML IJ SOLN: 4.0000 mg | Freq: Four times a day (QID) | INTRAMUSCULAR | Status: DC | PRN

## 2012-03-21 MED ORDER — HYDROMORPHONE 0.3 MG/ML IV SOLN
INTRAVENOUS | Status: DC
  Administered 2012-03-21: 1.5 mg via INTRAVENOUS
  Administered 2012-03-21: 3.3 mg via INTRAVENOUS
  Administered 2012-03-21: 13:00:00 via INTRAVENOUS
  Administered 2012-03-22: 1.5 mg via INTRAVENOUS
  Administered 2012-03-22: 0.9 mg via INTRAVENOUS
  Administered 2012-03-22: 1.2 mg via INTRAVENOUS
  Administered 2012-03-22: 7.5 mg via INTRAVENOUS
  Administered 2012-03-22: 0.9 mg via INTRAVENOUS
  Administered 2012-03-22: 1.2 mg via INTRAVENOUS
  Administered 2012-03-23: 2.1 mg via INTRAVENOUS
  Administered 2012-03-23: 7.5 mg via INTRAVENOUS
  Administered 2012-03-23: 22:00:00 via INTRAVENOUS
  Administered 2012-03-23: 1.8 mg via INTRAVENOUS
  Administered 2012-03-23: 1.8 mL via INTRAVENOUS
  Administered 2012-03-23 (×2): 0.9 mg via INTRAVENOUS
  Administered 2012-03-24: 3.3 mg via INTRAVENOUS
  Administered 2012-03-24: 2.21 mg via INTRAVENOUS
  Administered 2012-03-24: 0.6 mg via INTRAVENOUS
  Filled 2012-03-21 (×5): qty 25

## 2012-03-21 MED ORDER — SODIUM CHLORIDE 0.9 % IJ SOLN: 9.0000 mL | INTRAMUSCULAR | Status: DC | PRN

## 2012-03-21 MED ORDER — DIPHENHYDRAMINE HCL 50 MG/ML IJ SOLN: 12.5000 mg | Freq: Four times a day (QID) | INTRAMUSCULAR | Status: DC | PRN

## 2012-03-21 MED ORDER — HYDROMORPHONE 0.3 MG/ML IV SOLN: INTRAVENOUS | Status: DC

## 2012-03-21 MED ORDER — OXYCODONE HCL 5 MG PO TABS
15.0000 mg | ORAL_TABLET | Freq: Four times a day (QID) | ORAL | Status: DC | PRN
  Administered 2012-03-22 – 2012-03-26 (×11): 15 mg via ORAL
  Filled 2012-03-21 (×11): qty 3

## 2012-03-21 MED ORDER — BACLOFEN 20 MG PO TABS: 20.0000 mg | ORAL_TABLET | Freq: Three times a day (TID) | ORAL | Status: DC

## 2012-03-21 MED ORDER — MORPHINE SULFATE ER 15 MG PO TBCR
30.0000 mg | EXTENDED_RELEASE_TABLET | Freq: Two times a day (BID) | ORAL | Status: DC
  Administered 2012-03-21 – 2012-03-22 (×3): 30 mg via ORAL
  Administered 2012-03-23: 15 mg via ORAL
  Administered 2012-03-23 – 2012-03-26 (×6): 30 mg via ORAL
  Filled 2012-03-21 (×3): qty 2
  Filled 2012-03-21: qty 1
  Filled 2012-03-21 (×2): qty 2
  Filled 2012-03-21: qty 1
  Filled 2012-03-21 (×3): qty 2
  Filled 2012-03-21: qty 1

## 2012-03-21 MED FILL — Bupivacaine HCl Inj 0.25%: INTRAMUSCULAR | Qty: 50 | Status: AC

## 2012-03-21 NOTE — Progress Notes (Signed)
Continued Utilization review complete.

## 2012-03-21 NOTE — Progress Notes (Signed)
Pt's activity orders remain flat bedrest. Will continue to follow and proceed when appropriate.  Thanks, Lise Auer, OT

## 2012-03-21 NOTE — Progress Notes (Signed)
PT Cancellation Note  Patient Details Name: JAYE POLIDORI MRN: [id number] DOB: 05-Jan-1951   Cancelled Treatment:    Reason Eval/Treat Not Completed: Pt continues to be on flat bed rest. Activity orders will need to be updated prior to resuming PT. Will continue to check back.   [facility] HELEN 03/21/2012, 7:59 AM Pager: [phone]

## 2012-03-21 NOTE — Progress Notes (Signed)
Talking on the phone. Pain under better control. Wound dry

## 2012-03-21 NOTE — Progress Notes (Signed)
[name], male   DOB: 06-18-1950, 61 y.o.   MRN: [id number] Wound dry, c/o muscle spasms, no weakness. No headache. Blood pressure up with pain but level to normal . We are going to start him in the pain medication regimen he had by the pain clinic while he was seeing by them. We will also get rehabilitation consult in am

## 2012-03-22 NOTE — Progress Notes (Signed)
Pt complaining of headache. Pt mentioned that medications are not helping with headache nor nausea.  Spoke with MD. We discussed patient feeling better once mobile tomorrow. Will not give patient scheduled morphine for now.

## 2012-03-22 NOTE — Progress Notes (Signed)
OT Cancellation Note  Patient Details Name: Darin Gomez MRN: [id number] DOB: 06-Jan-1951   Cancelled Treatment:     Activity orders remain flat bedrest.  Will proceed with OT when appropriate.  Evern Bio 03/22/2012, 8:21 AM

## 2012-03-22 NOTE — Progress Notes (Signed)
[name], male   DOB: [birth year], 61 y.o.   MRN: [id number] C/o headache while lying flat. Wound dry. No weakness  No swelling. HA secondary to dilaudid? PT TO SEE HIM AS WELL AS REHABILITATION

## 2012-03-22 NOTE — Progress Notes (Signed)
PT Cancellation Note  Patient Details Name: Darin Gomez MRN: [id number] DOB: [birth year]   Cancelled Treatment:    Reason Eval/Treat Not Completed: Other (comment) (pt flat on bedrest)   Edith Groleau, Adline Potter 03/22/2012, 10:58 AM

## 2012-03-23 LAB — BASIC METABOLIC PANEL
BUN: 12 mg/dL (ref 6–23)
Calcium: 9.1 mg/dL (ref 8.4–10.5)
Creatinine, Ser: 0.79 mg/dL (ref 0.50–1.35)
GFR calc non Af Amer: >90 mL/min (ref >90)
Glucose, Bld: 107 mg/dL — ABNORMAL HIGH (ref 70–99)
Potassium: 3.8 mEq/L (ref 3.5–5.1)

## 2012-03-23 MED ORDER — ALUM & MAG HYDROXIDE-SIMETH 200-200-20 MG/5ML PO SUSP: 30.0000 mL | Freq: Four times a day (QID) | ORAL | Status: DC | PRN

## 2012-03-23 MED ORDER — MECLIZINE HCL 25 MG PO TABS
25.0000 mg | ORAL_TABLET | Freq: Three times a day (TID) | ORAL | Status: DC | PRN
  Administered 2012-03-23 – 2012-03-26 (×4): 25 mg via ORAL
  Filled 2012-03-23 (×5): qty 1

## 2012-03-23 NOTE — Progress Notes (Signed)
Physical medicine and rehabilitation consultations been requested after recent lumbar L3-4-5 redo laminectomy with cauda equina syndrome. Patient has been on bed rest since 03/17/2012 secondary to CSF leak. Will await physical and occupational therapy to be resumed and address mobility. Will followup with formal rehabilitation consult once all disciplines addressed

## 2012-03-23 NOTE — Progress Notes (Signed)
Rehab Admissions Coordinator Note:  Patient was screened by Darin Gomez for appropriateness for an Inpatient Acute Rehab Consult. Noted PT eval today after prolonged bedrest. Await OT eval and progress with therapy over the next 24 hours. I discussed with [place]. At this time, we are recommending to follow up tomorrow to assess his functional progress before pursuing full inpatient rehab consult and insurance determination.  Darin Dupes, RN 03/23/2012, 12:01 PM  I can be reached at [phone].

## 2012-03-23 NOTE — Evaluation (Signed)
Occupational Therapy Evaluation Patient Details Name: Darin Gomez MRN: [id number] DOB: [birth year] Today's Date: 03/23/2012 Time: 4098-1191 OT Time Calculation (min): 21 min  OT Assessment / Plan / Recommendation Clinical Impression  Pt reevaluated after bedrest x 8 days. Pt s/p back sx.  Requiring mod to total assist in self care and +2 assist with mobility.  Pt experiencing pain, nausea, dizziness further limiting function.  Recommend inpatient rehab.  Will follow acutely to address the below areas of  deficit.    OT Assessment  Patient needs continued OT Services    Follow Up Recommendations  Inpatient Rehab    Barriers to Discharge      Equipment Recommendations  None recommended by PT    Recommendations for Other Services    Frequency  Min 2X/week    Precautions / Restrictions Precautions Precautions: Fall;Back Precaution Booklet Issued: Yes (comment) Spinal Brace:  (corset not in pt's room, no orders for one) Restrictions Weight Bearing Restrictions: No   Pertinent Vitals/Pain 6/10 HA, using PCA    ADL  Eating/Feeding: Simulated;Set up Where Assessed - Eating/Feeding: Chair Grooming: Simulated;Minimal assistance Where Assessed - Grooming: Supported sitting Upper Body Bathing: Simulated;Moderate assistance Where Assessed - Upper Body Bathing: Unsupported sitting Lower Body Bathing: +1 Total assistance;Simulated Where Assessed - Lower Body Bathing: Supported sit to stand Upper Body Dressing: Performed;Moderate assistance Where Assessed - Upper Body Dressing: Unsupported sitting Lower Body Dressing: Simulated;+1 Total assistance Where Assessed - Lower Body Dressing: Supported sit to stand Toilet Transfer: Mining engineer Method: Sit to Barista: Other (comment) (to recliner) Toileting - Clothing Manipulation and Hygiene: Simulated;+1 Total assistance Where Assessed - Toileting Clothing Manipulation and  Hygiene: Sit to stand from 3-in-1 or toilet Equipment Used: Rolling walker;Gait belt Transfers/Ambulation Related to ADLs: +2 total pt 80% with RWE as pt has been bedbound x one week. ADL Comments: Pt with HA and dizziness limiting ability to participate in ADL .  Instructed pt in visual stabilization techniques to minimize. Pt has a hx of vertigo.    OT Diagnosis: Generalized weakness;Acute pain  OT Problem List: Decreased activity tolerance;Impaired balance (sitting and/or standing);Decreased knowledge of use of DME or AE;Decreased knowledge of precautions;Pain OT Treatment Interventions: Self-care/ADL training;DME and/or AE instruction;Patient/family education;Balance training   OT Goals Acute Rehab OT Goals OT Goal Formulation: With patient Time For Goal Achievement: 04/06/12 Potential to Achieve Goals: Good ADL Goals Pt Will Perform Grooming: with supervision;Standing at sink ADL Goal: Grooming - Progress: Goal set today Pt Will Perform Upper Body Bathing: with supervision;Sitting in shower ADL Goal: Upper Body Bathing - Progress: Goal set today Pt Will Perform Lower Body Bathing: Sitting in shower;with supervision;with adaptive equipment ADL Goal: Lower Body Bathing - Progress: Goal set today Pt Will Perform Upper Body Dressing: with set-up;Sitting, bed ADL Goal: Upper Body Dressing - Progress: Goal set today Pt Will Perform Lower Body Dressing: with adaptive equipment;with supervision;Sit to stand from bed ADL Goal: Lower Body Dressing - Progress: Goal set today Pt Will Transfer to Toilet: with supervision;Ambulation;with DME;Maintaining back safety precautions ADL Goal: Toilet Transfer - Progress: Goal set today Pt Will Perform Toileting - Clothing Manipulation: with supervision;Sitting on 3-in-1 or toilet ADL Goal: Toileting - Clothing Manipulation - Progress: Goal set today Pt Will Perform Toileting - Hygiene: with supervision;Sitting on 3-in-1 or toilet ADL Goal: Toileting -  Hygiene - Progress: Goal set today Pt Will Perform Tub/Shower Transfer: with min assist;Ambulation;Tub transfer;with DME;Maintaining back safety precautions ADL Goal: Tub/Shower Transfer - Progress:  Goal set today Miscellaneous OT Goals Miscellaneous OT Goal #1: Pt will be able to state 3/3 back precautions OT Goal: Miscellaneous Goal #1 - Progress: Goal set today  Visit Information  Last OT Received On: 03/23/12 Assistance Needed: +2 PT/OT Co-Evaluation/Treatment: Yes    Subjective Data  Subjective: "I feel sick when I look down." Patient Stated Goal: Home by Monday.   Prior Functioning     Home Living Lives With: Spouse Available Help at Discharge: Family;Available 24 hours/day Type of Home: House Home Access: Stairs to enter [employer] of Steps: 2 Entrance Stairs-Rails: Right;Left Home Layout: One level Bathroom Shower/Tub: Forensic scientist: Standard Bathroom Accessibility: Yes How Accessible: Accessible via walker Home Adaptive Equipment: Other (comment);Shower chair with back;Walker - rolling;Straight cane Prior Function Level of Independence: Independent Able to Take Stairs?: Yes Driving: Yes Vocation: Full time employment Communication Communication: No difficulties Dominant Hand: Right         Vision/Perception     Cognition  Overall Cognitive Status: Appears within functional limits for tasks assessed/performed Arousal/Alertness: Awake/alert Orientation Level: Appears intact for tasks assessed Behavior During Session: [facility] for tasks performed Cognition - Other Comments: frustrated with having lied flat for 8 days    Extremity/Trunk Assessment Right Upper Extremity Assessment RUE ROM/Strength/Tone: [facility] for tasks assessed;Unable to fully assess;Due to precautions Left Upper Extremity Assessment LUE ROM/Strength/Tone: [facility] for tasks assessed;Unable to fully assess;Due to precautions Trunk Assessment Trunk Assessment:  Normal     Mobility Bed Mobility Bed Mobility: Rolling Right;Right Sidelying to Sit Rolling Right: 4: Min guard;With rail Rolling Left: 4: Min guard Right Sidelying to Sit: HOB elevated;With rails Sitting - Scoot to Edge of Bed: 4: Min guard Details for Bed Mobility Assistance: elevated HOB to 30 and 45 degrees monitoring BP prior to rolling and sidelying->sit; verbal cues for sequencing of movement and close gaurding 2/2 first time up in 8 days, increased dizziness with activity (?vertigo or med related as BP remained stable) Transfers Transfers: Sit to Stand;Stand to Sit Sit to Stand: 1: +2 Total assist;From bed;With upper extremity assist Sit to Stand: Patient Percentage: 80% Stand to Sit: To chair/3-in-1;With upper extremity assist;1: +2 Total assist Stand to Sit: Patient Percentage: 80% Details for Transfer Assistance: cue for safe hand placement and follow through/stabilization upon standing     Shoulder Instructions     Exercise    Balance Balance Balance Assessed: Yes Dynamic Standing Balance Dynamic Standing - Balance Support: Bilateral upper extremity supported Dynamic Standing - Level of Assistance: 4: Min assist Dynamic Standing - Comments: pt performed marching in place with BUE on RW, one LOB to the right needing assist to correct   End of Session OT - End of Session Equipment Utilized During Treatment: Gait belt Activity Tolerance: Other (comment);Patient limited by pain (limited by nausea, dizziness) Patient left: in chair;with call bell/phone within reach;with family/visitor present  GO     Evern Bio 03/23/2012, 12:10 PM [phone]

## 2012-03-23 NOTE — Progress Notes (Signed)
[name], male   DOB: 1951-04-03, 61 y.o.   MRN: [id number] Wound dry, no more migraine headache. Decrease of spasms, no weakness. Plan oob

## 2012-03-23 NOTE — Progress Notes (Signed)
PhysicalTherapy Re-Evaluation Patient Details Name: Darin Gomez MRN: [id number] DOB: February 10, 1951 Today's Date: 03/23/2012 Time: 2130-8657 PT Time Calculation (min): 34 min  PT Assessment / Plan / Recommendation Comments on Treatment Session  Darin Gomez is 61 y/o male s/p spinal fusion initially evaluated by PT 03/16/12 who has been on bed rest orders for the past 8 days 2/2 migraine headaches and muscle spasms. Presents to PT today with limited mobility secondary to pain and generalized weakness. Will benefit physical therapy in the acute setting to maxmize function in prep for safe d/c home. Pt is very motivated to get moving.     Follow Up Recommendations  Post acute inpatient     Does the patient have the potential to tolerate intense rehabilitation  Yes, Recommend IP Rehab Screening  Barriers to Discharge        Equipment Recommendations  None recommended by PT    Recommendations for Other Services Rehab consult  Frequency 7X/week   Plan Discharge plan needs to be updated;Frequency needs to be updated    Precautions / Restrictions Precautions Precautions: Fall;Back Precaution Booklet Issued: Yes (comment) Spinal Brace:  (corset not in pt's room, no orders for one) Restrictions Weight Bearing Restrictions: No       Mobility  Bed Mobility Bed Mobility: Rolling Right;Right Sidelying to Sit Rolling Right: 4: Min guard;With rail Rolling Left: 4: Min guard Right Sidelying to Sit: HOB elevated;With rails (45 degrees) Details for Bed Mobility Assistance: elevated HOB to 30 and 45 degrees monitoring BP prior to rolling and sidelying->sit; verbal cues for sequencing of movement and close gaurding 2/2 first time up in 8 days, increased dizziness with activity (?vertigo or med related as BP remained stable) Transfers Transfers: Sit to Stand;Stand to Sit Sit to Stand: 1: +2 Total assist;From bed;With upper extremity assist Sit to Stand: Patient Percentage: 80% Stand to Sit: To  chair/3-in-1;With upper extremity assist;1: +2 Total assist Stand to Sit: Patient Percentage: 80% Details for Transfer Assistance: cue for safe hand placement and follow through/stabilization upon standing Ambulation/Gait Ambulation/Gait Assistance: 1: +2 Total assist Ambulation/Gait: Patient Percentage: 80% Ambulation Distance (Feet): 5 Feet Assistive device: Rolling walker Ambulation/Gait Assistance Details: cues for tall posture and tactile assist for stability, pt easily fatigued and c/o dizziness during movement (specifically eye movement), modA to correct one moderate LOB to the right Gait Pattern: Shuffle;Decreased stride length General Gait Details: slow cautious, decreased trunk/head rotation Wheelchair Mobility Wheelchair Mobility: No    Acute Rehab PT Goals PT Goal: Rolling Supine to Right Side - Progress: Progressing toward goal PT Goal: Supine/Side to Sit - Progress: Progressing toward goal PT Goal: Sit to Stand - Progress: Progressing toward goal PT Goal: Stand to Sit - Progress: Progressing toward goal PT Transfer Goal: Bed to Chair/Chair to Bed - Progress: Progressing toward goal PT Goal: Ambulate - Progress: Progressing toward goal  Visit Information  Last PT Received On: 03/23/12 Assistance Needed: +2    Subjective Data  Subjective: If you put me back in the bed I won't tell you I have a head ache.  Patient Stated Goal: I want to go home by Sunday.    Cognition  Overall Cognitive Status: Appears within functional limits for tasks assessed/performed Arousal/Alertness: Awake/alert Orientation Level: Appears intact for tasks assessed Behavior During Session: [facility] for tasks performed Cognition - Other Comments: frustrated with having lied flat for 8 days    Balance  Balance Balance Assessed: Yes Dynamic Standing Balance Dynamic Standing - Balance Support: Bilateral upper extremity supported  Dynamic Standing - Level of Assistance: 4: Min assist Dynamic Standing  - Comments: pt performed marching in place with BUE on RW, one LOB to the right needing assist to correct  End of Session PT - End of Session Equipment Utilized During Treatment: Gait belt Activity Tolerance: Patient tolerated treatment well;Patient limited by fatigue Patient left: in chair;with call bell/phone within reach;with family/visitor present (plan to get back to bed after an hour) Nurse Communication: Mobility status   GP     [facility] HELEN 03/23/2012, 11:01 AM

## 2012-03-24 NOTE — Progress Notes (Signed)
Occupational Therapy Treatment Patient Details Name: Darin Gomez MRN: [id number] DOB: July 12, 1950 Today's Date: 03/24/2012 Time: 7829-5621 OT Time Calculation (min): 30 min  OT Assessment / Plan / Recommendation Comments on Treatment Session Pt has made great progress since last session.  Pt is mobilizing with min guard-min assist. Wife plans to purchase AE today.   Updating d/c plan for home with HHOT.    Follow Up Recommendations  Home health OT;Supervision/Assistance - 24 hour    Barriers to Discharge       Equipment Recommendations  None recommended by OT    Recommendations for Other Services    Frequency Min 2X/week   Plan Discharge plan needs to be updated    Precautions / Restrictions Precautions Precautions: Fall;Back Precaution Booklet Issued: Yes (comment) Precaution Comments: Pt able to recall 3/3 back precautions Spinal Brace:  (brace at home, no orders for it) Restrictions Weight Bearing Restrictions: No   Pertinent Vitals/Pain See vitals    ADL  Upper Body Dressing: Performed;Set up Where Assessed - Upper Body Dressing: Unsupported sitting Toilet Transfer: Simulated;Minimal assistance Toilet Transfer Method: Sit to stand Toilet Transfer Equipment:  (bed to ambulate in hall then return to chair in room) Equipment Used: Gait belt;Rolling walker Transfers/Ambulation Related to ADLs: Ambulated with min guard and RW.  Min assist for sit<>stand from bed.  Pt with dizziness during mobility/ADLs and does better with something to visually focus on during activity. ADL Comments: Pt deferred further ADLs today as he wanted to primarily focus on mobility (he is concerned about the dizziness/nausea he is experiencing during mobility/activity).  Wife reports she plans to purchase hip kit from hospital later today.     OT Diagnosis:    OT Problem List:   OT Treatment Interventions:     OT Goals ADL Goals Pt Will Perform Upper Body Dressing: with set-up;Sitting,  bed ADL Goal: Upper Body Dressing - Progress: Met Pt Will Transfer to Toilet: with supervision;Ambulation;with DME;Maintaining back safety precautions ADL Goal: Toilet Transfer - Progress: Progressing toward goals Miscellaneous OT Goals Miscellaneous OT Goal #1: Pt will be able to state 3/3 back precautions OT Goal: Miscellaneous Goal #1 - Progress: Progressing toward goals  Visit Information  Last OT Received On: 03/24/12 Assistance Needed: +1 PT/OT Co-Evaluation/Treatment: Yes    Subjective Data      Prior Functioning       Cognition  Overall Cognitive Status: Appears within functional limits for tasks assessed/performed Arousal/Alertness: Awake/alert Orientation Level: Appears intact for tasks assessed Behavior During Session: [facility] for tasks performed    Mobility  Shoulder Instructions Bed Mobility Bed Mobility: Sit to Sidelying Right Rolling Right: 6: Modified independent (Device/Increase time) Right Sidelying to Sit: 6: Modified independent (Device/Increase time) Sit to Sidelying Right: 5: Supervision;HOB flat;With rail Details for Bed Mobility Assistance: verbal cues for technique and positioning Transfers Transfers: Sit to Stand;Stand to Sit Sit to Stand: 4: Min guard;With upper extremity assist;From chair/3-in-1 Stand to Sit: To bed;With upper extremity assist;4: Min guard Details for Transfer Assistance: cues for safety and target finding to improve/decrease dizziness       Exercises      Balance     End of Session OT - End of Session Equipment Utilized During Treatment: Gait belt Activity Tolerance: Patient tolerated treatment well Patient left: in chair;with call bell/phone within reach;with family/visitor present Nurse Communication: Mobility status;Patient requests pain meds;Other (comment) (pt requesting nausea meds)  GO    03/24/2012 Cipriano Mile OTR/L Pager [phone] Office [phone]  Laural Benes,  Saverio Danker 03/24/2012, 1:51 PM

## 2012-03-24 NOTE — Progress Notes (Signed)
[name], male   DOB: [birth year], 61 y.o.   MRN: [id number] Better, decrease of incisional pain. Vertigo brtter, no weakness. See orders

## 2012-03-24 NOTE — Progress Notes (Signed)
Physical Therapy Treatment Patient Details Name: Darin Gomez MRN: [id number] DOB: 1950/10/29 Today's Date: 03/24/2012 Time: 9562-1308 PT Time Calculation (min): 8 min  PT Assessment / Plan / Recommendation Comments on Treatment Session  Darin Gomez was assisted back to bed. Moving well.     Follow Up Recommendations  Home health PT;Supervision for mobility/OOB     Does the patient have the potential to tolerate intense rehabilitation     Barriers to Discharge        Equipment Recommendations  None recommended by PT    Recommendations for Other Services    Frequency Min 5X/week   Plan Discharge plan remains appropriate;Frequency remains appropriate    Precautions / Restrictions Precautions Precautions: Fall;Back Precaution Booklet Issued: Yes (comment) Spinal Brace:  (brace at home, no orders for it) Restrictions Weight Bearing Restrictions: No       Mobility  Bed Mobility Sit to Sidelying Right: 5: Supervision;HOB flat;With rail Details for Bed Mobility Assistance: verbal cues for technique and positioning Transfers Transfers: Sit to Stand;Stand to Sit Sit to Stand: 4: Min guard;With upper extremity assist;From chair/3-in-1 Stand to Sit: To bed;With upper extremity assist;4: Min guard Details for Transfer Assistance: cues for safety and target finding to improve/decrease dizziness Ambulation/Gait Ambulation/Gait Assistance: 5: Supervision Ambulation Distance (Feet): 10 Feet Assistive device: Rolling walker Ambulation/Gait Assistance Details: cues for tall posture as well a sequential eye->head movements to decrease dizziness with turns Gait Pattern: Trunk flexed Gait velocity: slow and cautious      PT Goals Acute Rehab PT Goals PT Goal: Sit to Supine/Side - Progress: Progressing toward goal PT Goal: Sit to Stand - Progress: Progressing toward goal PT Goal: Stand to Sit - Progress: Progressing toward goal PT Transfer Goal: Bed to Chair/Chair to Bed -  Progress: Progressing toward goal PT Goal: Ambulate - Progress: Progressing toward goal  Visit Information  Last PT Received On: 03/24/12 Assistance Needed: +1    Subjective Data  Subjective: It takes so much energy for me to stabilize my head, it just feels like it's floating all over the place so I'd like to lie back down.  Patient Stated Goal: home saturday, back to work   Cognition  Overall Cognitive Status: Appears within functional limits for tasks assessed/performed Arousal/Alertness: Awake/alert Orientation Level: Appears intact for tasks assessed Behavior During Session: Darin Gomez for tasks performed    Balance     End of Session PT - End of Session Equipment Utilized During Treatment: Gait belt Activity Tolerance: Patient tolerated treatment well Patient left: in bed;with call bell/phone within reach Nurse Communication: Mobility status   Darin Gomez     Darin Gomez Darin Gomez 03/24/2012, 11:10 AM

## 2012-03-24 NOTE — Progress Notes (Signed)
Chaplain referred to this pt by chaplain volunteer from last night. Pt very tired due to not being able to sleep due to medical staff continually in and out of room all day and night. Not a complaint, but pt just tired after 9 days of this. Pt has been discouraged due to spinal fluid leak that required many days of lying flat. Now pt and wife are very encouraged that he may be going home in two days. Chaplain visited with pt and wife until pt began dozing, then visited more with wife about pt's situation. Offered encouragement and emotional support.

## 2012-03-24 NOTE — Progress Notes (Signed)
Rehab Admissions Coordinator Note:  Patient was screened by Clois Dupes for appropriateness for an Inpatient Acute Rehab Consult. I am following up from yesterday's screen. Patient has made significant progress over the past 24 hrs . Discussed with PT. Recommend home with home health when medically ready. No needs for an inpt rehab admission at this time. Clois Dupes, RN10/25/2013, 12:11 PM  I can be reached at [phone].

## 2012-03-24 NOTE — Progress Notes (Signed)
Physical Therapy Treatment Patient Details Name: Darin Gomez MRN: [id number] DOB: 11/25/1950 Today's Date: 03/24/2012 Time: 1478-2956 PT Time Calculation (min): 31 min  PT Assessment / Plan / Recommendation Comments on Treatment Session  Mobility improved enough today to d/c home with HHPT. Biggest complaints of calf/leg cramping, posterior headache, nausea and dizziness. His dizziness appears vestibular. Limited treatment options because of pt's back precautions. Introduced x1 vestibular/gaze stabilization exercises however pt reports too much dizziness so deferred these.     Follow Up Recommendations  Home health PT;Supervision for mobility/OOB     Does the patient have the potential to tolerate intense rehabilitation     Barriers to Discharge        Equipment Recommendations  None recommended by PT    Recommendations for Other Services    Frequency     Plan Discharge plan needs to be updated;Frequency remains appropriate    Precautions / Restrictions Precautions Precautions: Fall;Back Precaution Booklet Issued: Yes (comment) Spinal Brace:  (brace at home, no orders for it) Restrictions Weight Bearing Restrictions: No   Pertinent Vitals/Pain 5/10 posterior headache, bilateral leg cramping, dizziness    Mobility  Bed Mobility Bed Mobility: Rolling Right;Right Sidelying to Sit Rolling Right: 6: Modified independent (Device/Increase time) Right Sidelying to Sit: 6: Modified independent (Device/Increase time) Transfers Transfers: Sit to Stand;Stand to Sit Sit to Stand: 4: Min assist;With upper extremity assist;From bed Stand to Sit: To chair/3-in-1;With upper extremity assist;With armrests;4: Min guard Details for Transfer Assistance: cues for hand placement, minA for stability Ambulation/Gait Ambulation/Gait Assistance: 4: Min guard Ambulation Distance (Feet): 300 Feet Assistive device: Rolling walker Ambulation/Gait Assistance Details: good tall posture, cues for  target finding and stable gaze to reduce dizziness, mingaurdA for safety as pt very gaurded with movement Gait Pattern: Decreased trunk rotation Gait velocity: slow and cautious      PT Goals Acute Rehab PT Goals PT Goal: Rolling Supine to Right Side - Progress: Met PT Goal: Supine/Side to Sit - Progress: Met PT Goal: Sit to Stand - Progress: Progressing toward goal PT Goal: Stand to Sit - Progress: Progressing toward goal PT Transfer Goal: Bed to Chair/Chair to Bed - Progress: Progressing toward goal PT Goal: Ambulate - Progress: Progressing toward goal  Visit Information  Last PT Received On: 03/24/12 Assistance Needed: +1    Subjective Data  Subjective: I still have a headache down the back of my neck, especially with swallowing.  Patient Stated Goal: home saturday, back to work   Cognition  Overall Cognitive Status: Appears within functional limits for tasks assessed/performed Arousal/Alertness: Awake/alert Orientation Level: Appears intact for tasks assessed Behavior During Session: [facility] for tasks performed    Balance     End of Session PT - End of Session Equipment Utilized During Treatment: Gait belt Activity Tolerance: Patient tolerated treatment well Patient left: in chair;with call bell/phone within reach Nurse Communication: Mobility status   GP     [facility] HELEN 03/24/2012, 11:04 AM

## 2012-03-25 MED ORDER — BISACODYL 10 MG RE SUPP
10.0000 mg | Freq: Every day | RECTAL | Status: DC | PRN
  Administered 2012-03-25: 10 mg via RECTAL
  Filled 2012-03-25: qty 1

## 2012-03-25 MED ORDER — FLEET ENEMA 7-19 GM/118ML RE ENEM
1.0000 | ENEMA | Freq: Every day | RECTAL | Status: DC | PRN
  Administered 2012-03-25: 1 via RECTAL
  Filled 2012-03-25: qty 1

## 2012-03-25 NOTE — Progress Notes (Signed)
Physical Therapy Treatment Patient Details Name: Darin Gomez MRN: [id number] DOB: [birth year] Today's Date: 03/25/2012 Time: 6213-0865 PT Time Calculation (min): 24 min  PT Assessment / Plan / Recommendation Comments on Treatment Session  Patient making improvements with mobility.  Did well on stairs.  Dizziness decreased today.    Follow Up Recommendations  Home health PT;Supervision for mobility/OOB     Does the patient have the potential to tolerate intense rehabilitation     Barriers to Discharge        Equipment Recommendations  None recommended by PT    Recommendations for Other Services    Frequency Min 5X/week   Plan Discharge plan remains appropriate;Frequency remains appropriate    Precautions / Restrictions Precautions Precautions: Back Precaution Comments: Pt able to recall 3/3 back precautions Required Braces or Orthoses:  (No orders for a brace) Restrictions Weight Bearing Restrictions: No   Pertinent Vitals/Pain     Mobility  Bed Mobility Bed Mobility: Rolling Right;Right Sidelying to Sit;Sitting - Scoot to Edge of Bed Rolling Right: 6: Modified independent (Device/Increase time);With rail Right Sidelying to Sit: 6: Modified independent (Device/Increase time);With rails;HOB flat Sitting - Scoot to Edge of Bed: 5: Supervision Details for Bed Mobility Assistance: No cues or assist needed.  Patient used proper technique, maintaining back precautions. Transfers Transfers: Sit to Stand;Stand to Sit Sit to Stand: 5: Supervision;With upper extremity assist;From bed Stand to Sit: 5: Supervision;With upper extremity assist;With armrests;To chair/3-in-1 Details for Transfer Assistance: Verbal cues for hand placement. Ambulation/Gait Ambulation/Gait Assistance: 6: Modified independent (Device/Increase time) Ambulation Distance (Feet): 320 Feet Assistive device: Rolling walker Ambulation/Gait Assistance Details: Patient with safe use of RW.  Patient taking  short, shuffling steps during gait.  Encouraged slightly longer steps, however this increased back pain - returned to short steps.  Cues for use of targets during turns to decrease dizziness. Gait Pattern: Step-through pattern;Decreased step length - right;Decreased step length - left Gait velocity: Slow gait speed Stairs: Yes Stairs Assistance: 5: Supervision Stairs Assistance Details (indicate cue type and reason): Instructed patient on safe navigation of stairs.  Instructed wife on proper guarding technique when assisting patient on stairs. Stair Management Technique: One rail Right;Step to pattern;Sideways Number of Stairs: 3  (x3)      PT Goals Acute Rehab PT Goals PT Goal: Rolling Supine to Right Side - Progress: Met PT Goal: Supine/Side to Sit - Progress: Met PT Goal: Sit to Stand - Progress: Progressing toward goal PT Goal: Stand to Sit - Progress: Progressing toward goal PT Transfer Goal: Bed to Chair/Chair to Bed - Progress: Progressing toward goal PT Goal: Ambulate - Progress: Met PT Goal: Up/Down Stairs - Progress: Met  Visit Information  Last PT Received On: 03/25/12 Assistance Needed: +1    Subjective Data  Subjective: "I'll do whatever it takes to get home"   Cognition  Overall Cognitive Status: Appears within functional limits for tasks assessed/performed Arousal/Alertness: Awake/alert Orientation Level: Oriented X4 / Intact Behavior During Session: [facility] for tasks performed    Balance     End of Session PT - End of Session Equipment Utilized During Treatment: Gait belt Activity Tolerance: Patient tolerated treatment well Patient left: in chair;with call bell/phone within reach;with family/visitor present Nurse Communication: Mobility status   GP     Vena Austria 03/25/2012, 3:36 PM Durenda Hurt. Renaldo Fiddler, [facility] Acute Rehab Services Pager [phone]

## 2012-03-26 MED ORDER — SODIUM CHLORIDE 0.9 % IJ SOLN: 9.0000 mL | INTRAMUSCULAR | Status: DC | PRN

## 2012-03-26 MED ORDER — OXYCODONE HCL 15 MG PO TABS: 15.0000 mg | ORAL_TABLET | Freq: Four times a day (QID) | ORAL | Status: DC | PRN

## 2012-03-26 MED ORDER — DIPHENHYDRAMINE HCL 50 MG/ML IJ SOLN: 12.5000 mg | Freq: Four times a day (QID) | INTRAMUSCULAR | Status: DC | PRN

## 2012-03-26 MED ORDER — PROMETHAZINE HCL 12.5 MG PO TABS: 25.0000 mg | ORAL_TABLET | Freq: Four times a day (QID) | ORAL | Status: DC | PRN

## 2012-03-26 MED ORDER — HYDROMORPHONE 0.3 MG/ML IV SOLN: INTRAVENOUS | Status: DC

## 2012-03-26 MED ORDER — DIPHENHYDRAMINE HCL 12.5 MG/5ML PO ELIX: 12.5000 mg | ORAL_SOLUTION | Freq: Four times a day (QID) | ORAL | Status: DC | PRN

## 2012-03-26 MED ORDER — TAMSULOSIN HCL 0.4 MG PO CAPS: 0.4000 mg | ORAL_CAPSULE | Freq: Every day | ORAL | Status: DC

## 2012-03-26 MED ORDER — MORPHINE SULFATE ER 30 MG PO TBCR: 30.0000 mg | EXTENDED_RELEASE_TABLET | Freq: Two times a day (BID) | ORAL | Status: DC

## 2012-03-26 MED ORDER — ONDANSETRON HCL 4 MG/2ML IJ SOLN: 4.0000 mg | Freq: Four times a day (QID) | INTRAMUSCULAR | Status: DC | PRN

## 2012-03-26 MED ORDER — NALOXONE HCL 0.4 MG/ML IJ SOLN: 0.4000 mg | INTRAMUSCULAR | Status: DC | PRN

## 2012-03-26 NOTE — Progress Notes (Signed)
   CARE MANAGEMENT NOTE 03/26/2012  Patient:  Darin Gomez, Darin Gomez   Account Number:  [id number]  Date Initiated:  03/20/2012  Documentation initiated by:  [facility]  Subjective/Objective Assessment:   Redo of the L3, L4, and L5 laminectomy involving also part  of the L2.  Lysis of adhesions.  Foraminotomy     Action/Plan:   lives at home with wife, Darin Gomez [phone], [phone]   Anticipated DC Date:  03/27/2012   Anticipated DC Plan:  HOME W HOME HEALTH SERVICES      DC Planning Services  CM consult      [facility] Choice  HOME HEALTH   Choice offered to / List presented to:  C-1 Patient        HH arranged  HH-2 PT  HH-3 OT      [facility] agency  [facility]   Status of service:  Completed, signed off Medicare Important Message given?   (If response is "NO", the following Medicare IM given date fields will be blank) Date Medicare IM given:   Date Additional Medicare IM given:    Discharge Disposition:  HOME W HOME HEALTH SERVICES  Per UR Regulation:  Reviewed for med. necessity/level of care/duration of stay  If discussed at Long Length of Stay Meetings, dates discussed:   03/21/2012  03/23/2012    Comments:  03/26/2012 1500 NCM notified [facility] of d/c home today with [facility] PT/OT. Isidoro Donning RN CCM Case Mgmt phone [phone]  03/20/2012 1200 NCM spoke to pt and states he had [facility] in the past. Has all his DME at home (RW, reacher, and elevated toilet seat). Will wait PT/OT recommendations. Pt unable to do PT/OT at this time due to spinal leak. NCM will continue to follow up for d/c needs. Isidoro Donning RN CCM Case Mgmt phone [phone]

## 2012-03-26 NOTE — Progress Notes (Signed)
Patient education and discharge instructions given to patient and family. All questions answered to patient's satisfaction. Pt D/C home in no signs of acute distress.

## 2012-03-26 NOTE — Progress Notes (Signed)
Occupational Therapy Treatment Patient Details Name: SUTTER AHLGREN MRN: [id number] DOB: 04-25-1951 Today's Date: 03/26/2012 Time: 0950-1040 OT Time Calculation (min): 50 min  OT Assessment / Plan / Recommendation Comments on Treatment Session Excellent progress. Overall mod I to S with all ADL and mobility. All education completed.    Follow Up Recommendations  Home health OT    Barriers to Discharge   none    Equipment Recommendations  None recommended by OT    Recommendations for Other Services  none  Frequency Min 2X/week   Plan Discharge plan remains appropriate    Precautions / Restrictions Precautions Precautions: Back Precaution Comments: Pt able to recall 3/3 back precautions Required Braces or Orthoses: Spinal Brace Restrictions Weight Bearing Restrictions: No   Pertinent Vitals/Pain none    ADL  Lower Body Dressing: Performed;Supervision/safety Where Assessed - Lower Body Dressing: Unsupported sit to stand Toilet Transfer: Performed;Supervision/safety Toilet Transfer Method: Sit to Barista: Comfort height toilet Toileting - Clothing Manipulation and Hygiene: Performed;Supervision/safety Where Assessed - Engineer, mining and Hygiene: Standing Tub/Shower Transfer: Supervision/safety Equipment Used: Long-handled shoe horn;Long-handled sponge;Reacher;Sock aid Transfers/Ambulation Related to ADLs: S ADL Comments: Increased time spent with pt/wife on education of back precatuions using AE. Pt apprehensive cue to prolonged bedrest. Discussed precautions and activity at length. Per Dr. Lovell Sheehan, pt OK to ride home without back brace on. Pt overall S with AE and compensatory techniques. both pt/wife very appreciative. given back handout.    OT Diagnosis:    OT Problem List:   OT Treatment Interventions:     OT Goals Acute Rehab OT Goals OT Goal Formulation: With patient Time For Goal Achievement: 04/06/12 Potential to  Achieve Goals: Good ADL Goals Pt Will Perform Grooming: with supervision;Standing at sink ADL Goal: Grooming - Progress: Met Pt Will Perform Upper Body Bathing: with supervision;Sitting in shower ADL Goal: Upper Body Bathing - Progress: Met Pt Will Perform Lower Body Bathing: Sitting in shower;with supervision;with adaptive equipment ADL Goal: Lower Body Bathing - Progress: Met Pt Will Perform Upper Body Dressing: with set-up;Sitting, bed ADL Goal: Upper Body Dressing - Progress: Met Pt Will Perform Lower Body Dressing: with adaptive equipment;with supervision;Sit to stand from bed ADL Goal: Lower Body Dressing - Progress: Met Pt Will Transfer to Toilet: with supervision;Ambulation;with DME;Maintaining back safety precautions ADL Goal: Toilet Transfer - Progress: Met Pt Will Perform Toileting - Clothing Manipulation: with supervision;Sitting on 3-in-1 or toilet ADL Goal: Toileting - Clothing Manipulation - Progress: Met Pt Will Perform Toileting - Hygiene: with supervision;Sitting on 3-in-1 or toilet ADL Goal: Toileting - Hygiene - Progress: Met Pt Will Perform Tub/Shower Transfer: with min assist;Ambulation;Tub transfer;with DME;Maintaining back safety precautions ADL Goal: Tub/Shower Transfer - Progress: Met Miscellaneous OT Goals Miscellaneous OT Goal #1: Pt will be able to state 3/3 back precautions OT Goal: Miscellaneous Goal #1 - Progress: Met  Visit Information  Last OT Received On: 03/26/12 Assistance Needed: +1    Subjective Data      Prior Functioning       Cognition       Mobility  Shoulder Instructions Bed Mobility Bed Mobility: Supine to Sit;Sitting - Scoot to Edge of Bed;Sit to Supine Rolling Right: 6: Modified independent (Device/Increase time) Rolling Left: 6: Modified independent (Device/Increase time) Transfers Transfers: Sit to Stand;Stand to Sit Sit to Stand: 6: Modified independent (Device/Increase time)       Exercises      Balance     End  of Session OT - End of  Session Activity Tolerance: Patient tolerated treatment well Patient left: in bed;with call bell/phone within reach;with family/visitor present Nurse Communication: Other (comment) (ready for D/C)  GO     Jaquavious Mercer,[name] 03/26/2012, 10:11 AM Luisa Dago, OTR/L  [phone] 03/26/2012

## 2012-03-26 NOTE — Discharge Summary (Signed)
Physician Discharge Summary  [name] MRN: [id number] DOB/AGE: 1951-04-21 61 y.o.  Admit date: 03/15/2012 Discharge date: 03/26/2012  Admission Diagnoses: L2-3, L3-4, L4-5, L5-S1 spinal stenosis, lumbago, lumbar radiculopathy.  Discharge Diagnoses: The same Active Problems:  * No active hospital problems. *    Discharged Condition: good  Hospital Course: Dr. Jeral Fruit admitted the patient to [facility] on 03/15/2012. On that day performed a redo L2, L3, L4 and L5 laminectomy. At surgery there was a dural tear.  Patient's postop course was remarkable for being at bedrest for several days after surgery secondary to a dural tear. The patient also has some urinary retention. He was started on Flomax and this resolved.  The patient was progressively mobilized. By 03/26/2012 patient was afebrile, vital signs were stable, there was no wound drainage. And he is requesting discharge to home. The patient was given oral and written discharge instructions. All his questions were answered.  Consults: None Significant Diagnostic Studies: None Treatments: L2, L3, L4 and L5 redo laminectomy Discharge Exam: Blood pressure 121/74, pulse 88, temperature 98.3 F (36.8 C), temperature source Oral, resp. rate 18, height 6\' 1"  (1.854 m), weight 114.76 kg (253 lb), SpO2 100.00%. The patient is alert and pleasant. He looks well. He denies headaches. He wants to go home. His wound is healing well without drainage. He is moving his lower extremities well.  Disposition: Home  Discharge Orders    Future Orders Please Complete By Expires   Diet - low sodium heart healthy      Increase activity slowly      Discharge instructions      Comments:   Call [phone] for a followup appointment and any further wound drainage.    Remove dressing in 72 hours      Call MD for:  temperature >100.4      Call MD for:  persistant nausea and vomiting      Call MD for:  severe uncontrolled  pain      Call MD for:  redness, tenderness, or signs of infection (pain, swelling, redness, odor or green/yellow discharge around incision site)      Call MD for:  difficulty breathing, headache or visual disturbances      Call MD for:  hives      Call MD for:  persistant dizziness or light-headedness      Call MD for:  extreme fatigue          Medication List     As of 03/26/2012  9:39 AM    TAKE these medications         aspirin 81 MG tablet   Take 81 mg by mouth daily.      baclofen 20 MG tablet   Commonly known as: LIORESAL   Take 20 mg by mouth 3 (three) times daily.      cetirizine 10 MG tablet   Commonly known as: ZYRTEC   Take 10 mg by mouth daily as needed. For allergies      docusate sodium 100 MG capsule   Commonly known as: COLACE   Take 200 mg by mouth daily.      lisinopril-hydrochlorothiazide 20-12.5 MG per tablet   Commonly known as: PRINZIDE,ZESTORETIC   Take 1 tablet by mouth daily.      Melatonin 10 MG Caps   Take 10 mg by mouth at bedtime.      morphine 30 MG 12 hr tablet   Commonly known as: MS CONTIN  Take 1 tablet (30 mg total) by mouth every 12 (twelve) hours.      morphine 30 MG tablet   Commonly known as: MSIR   Take 30 mg by mouth 2 times daily at 12 noon and 4 pm.      oxyCODONE 15 MG immediate release tablet   Commonly known as: ROXICODONE   Take 1 tablet (15 mg total) by mouth every 6 (six) hours as needed.      oxyCODONE 15 MG immediate release tablet   Commonly known as: ROXICODONE   Take 15 mg by mouth every 4 (four) hours as needed. For break through pain      pravastatin 40 MG tablet   Commonly known as: PRAVACHOL   Take 40 mg by mouth 2 (two) times daily.      promethazine 12.5 MG tablet   Commonly known as: PHENERGAN   Take 2 tablets (25 mg total) by mouth every 6 (six) hours as needed for nausea.      psyllium 58.6 % powder   Commonly known as: METAMUCIL   Take 1 packet by mouth daily.      Tamsulosin HCl 0.4 MG  Caps   Commonly known as: FLOMAX   Take 1 capsule (0.4 mg total) by mouth daily.      Vitamin D 2000 UNITS Caps   Take 2,000 Units by mouth daily.         SignedCristi Loron 03/26/2012, 9:39 AM

## 2014-03-01 ENCOUNTER — Encounter: Payer: Self-pay | Admitting: Cardiovascular Disease

## 2014-03-01 ENCOUNTER — Encounter: Payer: Self-pay | Admitting: *Deleted

## 2014-03-01 ENCOUNTER — Ambulatory Visit (INDEPENDENT_AMBULATORY_CARE_PROVIDER_SITE_OTHER): Payer: Managed Care, Other (non HMO) | Admitting: Cardiovascular Disease

## 2014-03-01 VITALS — BP 128/80 | HR 65 | Ht 73.0 in | Wt 294.0 lb

## 2014-03-01 DIAGNOSIS — R0789 Other chest pain: Secondary | ICD-10-CM

## 2014-03-01 DIAGNOSIS — I200 Unstable angina: Secondary | ICD-10-CM

## 2014-03-01 DIAGNOSIS — R079 Chest pain, unspecified: Secondary | ICD-10-CM

## 2014-03-01 MED ORDER — NITROGLYCERIN 0.4 MG SL SUBL: 0.4000 mg | SUBLINGUAL_TABLET | SUBLINGUAL | Status: DC | PRN

## 2014-03-01 NOTE — Assessment & Plan Note (Addendum)
His chest discomfort since concerning. Originally I was concerned about a pulmonary embolus and is still must remain in the differential.. He's had a  positive stress test shows lateral ischemia.  We discussed the risks, benefits, and options concerning cardiac catheterization. He understands and agrees to proceed.  If his cardiac cath is negative ,  Then he needs to have a CT angiogram of the lung scheduled for the following day to look for a pulmonary and was. He has been having some left leg pain. We also could consider a venous duplex scan. Because of his back injury he is relatively inactive and spends a lot of time in the chair or couch.  He's already on aspirin. We'll add nitroglycerin sublingually as needed. His blood pressure and heart rate is too low to consider beta blocker at this time.  He scheduled for cardiac catheterization on Monday morning at 7:30 with Dr. Martinique.

## 2014-03-01 NOTE — Patient Instructions (Signed)
Your physician has requested that you have a cardiac catheterization see attached instruction letter. Cardiac catheterization is used to diagnose and/or treat various heart conditions. Doctors may recommend this procedure for a number of different reasons. The most common reason is to evaluate chest pain. Chest pain can be a symptom of coronary artery disease (CAD), and cardiac catheterization can show whether plaque is narrowing or blocking your heart's arteries. This procedure is also used to evaluate the valves, as well as measure the blood flow and oxygen levels in different parts of your heart. For further information please visit [facility]. Please follow instruction sheet, as given.  Your physician has recommended you make the following change in your medication:  Start Nitroglycerin 0.4 mg sublingual every 15 minutes x 3 as needed for chest pain

## 2014-03-01 NOTE — Progress Notes (Signed)
Darin Gomez Date of Birth  01-30-1951       [name] [name], [name], [name]  [address]   [place], [place]  [address] [name]   Fax  [phone]     Fax [phone]  Problem List: 1. Chest pain 2. Hypertension  3. Hyperlipidemia 4. Pre- Diabetes  History of Present Illness:  Darin Gomez is  a 63 yo who is seen today for further evaluation of his chest discomfort.  He has been having chest discomfort and dyspnea.  The other day, he woke up , had his coffee - had sudden feeling of shortness of breath, profuse sweats, chest pain.  Definitely worse with deep breath.  The pain lasted 30 minutes.  He has had several episodes of similar pain.    He had a pharmacological stress Myoview study at [facility] which revealed lateral ischemia. He  normal left ventricular systolic function with an ejection fraction of 55%.  He was sent here for further evaluation  He works for a [employer], dispensing.   Works from home now  Non smoker  Drinks ETOH - 1 beer with dinner  Fhx:  Mother with MI at age 23 Brother died of MI at age 22 Father - no CAD as far as patient knows    Current Outpatient Prescriptions on File Prior to Visit  Medication Sig Dispense Refill  . aspirin 81 MG tablet Take 81 mg by mouth daily.      . baclofen (LIORESAL) 20 MG tablet Take 20 mg by mouth 3 (three) times daily.      . cetirizine (ZYRTEC) 10 MG tablet Take 10 mg by mouth daily as needed. For allergies      . docusate sodium (COLACE) 100 MG capsule Take 300 mg by mouth daily.       . Melatonin 10 MG CAPS Take 10 mg by mouth at bedtime.      Marland Kitchen morphine (MSIR) 30 MG tablet Take 30 mg by mouth 2 times daily at 12 noon and 4 pm.      . oxyCODONE (ROXICODONE) 15 MG immediate release tablet Take 15 mg by mouth every 4 (four) hours as needed. For break through pain      . promethazine (PHENERGAN) 12.5 MG tablet  Take 2 tablets (25 mg total) by mouth every 6 (six) hours as needed for nausea.  30 tablet  1   No current facility-administered medications on file prior to visit.    Allergies  Allergen Reactions  . Gabapentin   . Keppra [Levetiracetam] Nausea And Vomiting  . Valium [Diazepam] Other (See Comments)    Significant depression; "a different person"    Past Medical History  Diagnosis Date  . PONV (postoperative nausea and vomiting)   . Heart murmur     as child....Marland Kitchenno problems now  . Headache(784.0)     occas.  migraines  (from last surgery)  . Arthritis     mainly in back  . Hypertension     dx  10 yrs or more  ago  . Hyperlipidemia     Past Surgical History  Procedure Laterality Date  . Back surgery    . Fracture surgery      left arm  . Lumbar laminectomy/decompression microdiscectomy  03/15/2012    Procedure: LUMBAR LAMINECTOMY/DECOMPRESSION MICRODISCECTOMY 2 LEVELS;  Surgeon: Floyce Stakes, MD;  Location:  [place] [facility];  Service: Neurosurgery;  Laterality: Bilateral;  Bilateral Lumbar three, lumbar four, lumbar five Laminectomy    History  Smoking status  . Former Smoker -- 45 years  Smokeless tobacco  . Architectural technologist  . Types: Chew    History  Alcohol Use  . 0.0 oz/week    Comment: beer  3 days a week....    Family History  Problem Relation Age of Onset  . Heart attack Mother   . Hypertension Mother   . Hyperlipidemia Mother   . Heart attack Brother 44    Reviw of Systems:  Reviewed in the HPI.  All other systems are negative.  Physical Exam: Blood pressure 128/80, pulse 65, height 6\' 1"  (1.854 m), weight 294 lb (133.358 kg). Wt Readings from Last 3 Encounters:  03/01/14 294 lb (133.358 kg)  03/15/12 253 lb (114.76 kg)  03/15/12 253 lb (114.76 kg)     General: Well developed, well nourished, in no acute distress.  Head: Normocephalic, atraumatic, sclera non-icteric, mucus membranes are moist,   Neck: Supple. Carotids are 2 + without  bruits. No JVD   Lungs: Clear   Heart: Rr, normal s1s2  Abdomen: Soft, non-tender, non-distended with normal bowel sounds.  Msk:  Strength and tone are normal   Extremities: No clubbing or cyanosis. No edema.  Distal pedal pulses are 2+ and equal    Neuro: CN II - XII intact.  Alert and oriented X 3.   Psych:  Normal   ECG:  03/01/2014: Normal sinus rhythm at 65. He has no ST or T wave changes.  Assessment / Plan:   1. Chest discomfort:  His chest discomfort since concerning. Originally I was concerned about a pulmonary embolus and is still must remain in the differential.. He's had a  positive stress test shows lateral ischemia.  We discussed the risks, benefits, and options concerning cardiac catheterization. He understands and agrees to proceed.  If his cardiac cath is negative ,  Then he needs to have a CT angiogram of the lung scheduled for the following day to look for a pulmonary and was. He has been having some left leg pain. We also could consider a venous duplex scan. Because of his back injury he is relatively inactive and spends a lot of time in the chair or couch.  He's already on aspirin. We'll add nitroglycerin sublingually as needed. His blood pressure and heart rate is too low to consider beta blocker at this time.  He scheduled for cardiac catheterization on Monday morning at 7:30 with Dr. Martinique.

## 2014-03-02 LAB — CBC WITH DIFFERENTIAL
Basophils Absolute: 0 10*3/uL (ref 0.0–0.2)
Basos: 0 %
EOS: 2 %
Eosinophils Absolute: 0.1 10*3/uL (ref 0.0–0.4)
HCT: 51.3 % — ABNORMAL HIGH (ref 37.5–51.0)
HEMOGLOBIN: 17.6 g/dL (ref 12.6–17.7)
IMMATURE GRANS (ABS): 0 10*3/uL (ref 0.0–0.1)
IMMATURE GRANULOCYTES: 0 %
LYMPHS: 30 %
Lymphocytes Absolute: 2.1 10*3/uL (ref 0.7–3.1)
MCH: 30.3 pg (ref 26.6–33.0)
MCHC: 34.3 g/dL (ref 31.5–35.7)
MCV: 88 fL (ref 79–97)
MONOS ABS: 0.8 10*3/uL (ref 0.1–0.9)
Monocytes: 11 %
NEUTROS PCT: 57 %
Neutrophils Absolute: 4 10*3/uL (ref 1.4–7.0)
PLATELETS: 241 10*3/uL (ref 150–379)
RBC: 5.81 x10E6/uL — AB (ref 4.14–5.80)
RDW: 15 % (ref 12.3–15.4)
WBC: 7 10*3/uL (ref 3.4–10.8)

## 2014-03-02 LAB — BASIC METABOLIC PANEL
BUN/Creatinine Ratio: 10 (ref 10–22)
BUN: 9 mg/dL (ref 8–27)
CALCIUM: 9.4 mg/dL (ref 8.6–10.2)
CO2: 24 mmol/L (ref 18–29)
CREATININE: 0.91 mg/dL (ref 0.76–1.27)
Chloride: 98 mmol/L (ref 97–108)
GFR calc Af Amer: 104 mL/min/{1.73_m2} (ref >59)
GFR calc non Af Amer: 90 mL/min/{1.73_m2} (ref >59)
GLUCOSE: 124 mg/dL — AB (ref 65–99)
POTASSIUM: 4.1 mmol/L (ref 3.5–5.2)
SODIUM: 139 mmol/L (ref 134–144)

## 2014-03-02 LAB — PROTIME-INR
INR: 1.1 (ref 0.8–1.2)
Prothrombin Time: 11 s (ref 9.1–12.0)

## 2014-03-03 MED ORDER — SODIUM CHLORIDE 0.9 % IJ SOLN: 3.0000 mL | INTRAMUSCULAR | Status: DC | PRN

## 2014-03-03 MED ORDER — SODIUM CHLORIDE 0.9 % IJ SOLN: 3.0000 mL | Freq: Two times a day (BID) | INTRAMUSCULAR | Status: DC

## 2014-03-03 MED ORDER — SODIUM CHLORIDE 0.9 % IV SOLN: 250.0000 mL | INTRAVENOUS | Status: DC | PRN

## 2014-03-03 MED ORDER — ASPIRIN 81 MG PO CHEW
81.0000 mg | CHEWABLE_TABLET | ORAL | Status: AC
  Administered 2014-03-04: 81 mg via ORAL

## 2014-03-03 MED ORDER — SODIUM CHLORIDE 0.9 % IV SOLN
INTRAVENOUS | Status: DC
  Administered 2014-03-04: 1000 mL via INTRAVENOUS

## 2014-03-04 ENCOUNTER — Encounter (HOSPITAL_COMMUNITY)
Admission: RE | Disposition: A | Discharge status: Home / Self Care | Payer: Self-pay | Source: Ambulatory Visit | Attending: Cardiology

## 2014-03-04 ENCOUNTER — Ambulatory Visit (HOSPITAL_COMMUNITY): Payer: Managed Care, Other (non HMO)

## 2014-03-04 ENCOUNTER — Ambulatory Visit (HOSPITAL_COMMUNITY)
Admission: RE | Admit: 2014-03-04 | Discharge: 2014-03-04 | Disposition: A | Discharge status: Home / Self Care | Payer: Managed Care, Other (non HMO) | Source: Ambulatory Visit | Attending: Cardiology | Admitting: Cardiology

## 2014-03-04 ENCOUNTER — Encounter (HOSPITAL_COMMUNITY): Payer: Self-pay | Admitting: Radiology

## 2014-03-04 DIAGNOSIS — R079 Chest pain, unspecified: Secondary | ICD-10-CM

## 2014-03-04 DIAGNOSIS — Z7982 Long term (current) use of aspirin: Secondary | ICD-10-CM | POA: Diagnosis not present

## 2014-03-04 DIAGNOSIS — R7309 Other abnormal glucose: Secondary | ICD-10-CM | POA: Insufficient documentation

## 2014-03-04 DIAGNOSIS — Z79891 Long term (current) use of opiate analgesic: Secondary | ICD-10-CM | POA: Diagnosis not present

## 2014-03-04 DIAGNOSIS — I10 Essential (primary) hypertension: Secondary | ICD-10-CM | POA: Diagnosis not present

## 2014-03-04 DIAGNOSIS — R0789 Other chest pain: Secondary | ICD-10-CM | POA: Diagnosis not present

## 2014-03-04 DIAGNOSIS — E785 Hyperlipidemia, unspecified: Secondary | ICD-10-CM | POA: Diagnosis not present

## 2014-03-04 DIAGNOSIS — Z8249 Family history of ischemic heart disease and other diseases of the circulatory system: Secondary | ICD-10-CM | POA: Diagnosis not present

## 2014-03-04 DIAGNOSIS — R9439 Abnormal result of other cardiovascular function study: Secondary | ICD-10-CM | POA: Diagnosis present

## 2014-03-04 HISTORY — PX: LEFT HEART CATHETERIZATION WITH CORONARY ANGIOGRAM: SHX5451

## 2014-03-04 SURGERY — LEFT HEART CATHETERIZATION WITH CORONARY ANGIOGRAM
Anesthesia: LOCAL

## 2014-03-04 MED ORDER — MIDAZOLAM HCL 2 MG/2ML IJ SOLN
INTRAMUSCULAR | Status: AC
  Filled 2014-03-04: qty 2

## 2014-03-04 MED ORDER — ASPIRIN 81 MG PO CHEW
CHEWABLE_TABLET | ORAL | Status: AC
  Administered 2014-03-04: 81 mg via ORAL
  Filled 2014-03-04: qty 1

## 2014-03-04 MED ORDER — LIDOCAINE HCL (PF) 1 % IJ SOLN
INTRAMUSCULAR | Status: AC
  Filled 2014-03-04: qty 30

## 2014-03-04 MED ORDER — FENTANYL CITRATE 0.05 MG/ML IJ SOLN
INTRAMUSCULAR | Status: AC
  Filled 2014-03-04: qty 2

## 2014-03-04 MED ORDER — SODIUM CHLORIDE 0.9 % IV SOLN
1.0000 mL/kg/h | INTRAVENOUS | Status: DC
Start: 2014-03-04 — End: 2014-03-04

## 2014-03-04 MED ORDER — HEPARIN SODIUM (PORCINE) 1000 UNIT/ML IJ SOLN
INTRAMUSCULAR | Status: AC
  Filled 2014-03-04: qty 1

## 2014-03-04 MED ORDER — HEPARIN (PORCINE) IN NACL 2-0.9 UNIT/ML-% IJ SOLN
INTRAMUSCULAR | Status: AC
  Filled 2014-03-04: qty 1000

## 2014-03-04 MED ORDER — ASPIRIN 81 MG PO CHEW
CHEWABLE_TABLET | ORAL | Status: AC
  Filled 2014-03-04: qty 1

## 2014-03-04 MED ORDER — IOHEXOL 350 MG/ML SOLN
80.0000 mL | Freq: Once | INTRAVENOUS | Status: AC | PRN
  Administered 2014-03-04: 80 mL via INTRAVENOUS

## 2014-03-04 MED ORDER — VERAPAMIL HCL 2.5 MG/ML IV SOLN
INTRAVENOUS | Status: AC
  Filled 2014-03-04: qty 2

## 2014-03-04 MED ORDER — NITROGLYCERIN 1 MG/10 ML FOR IR/CATH LAB
INTRA_ARTERIAL | Status: AC
  Filled 2014-03-04: qty 10

## 2014-03-04 NOTE — Interval H&P Note (Signed)
History and Physical Interval Note:  03/04/2014 7:27 AM  Darin Gomez  has presented today for surgery, with the diagnosis of unstabler angina, positive myobview  The various methods of treatment have been discussed with the patient and family. After consideration of risks, benefits and other options for treatment, the patient has consented to  Procedure(s): LEFT HEART CATHETERIZATION WITH CORONARY ANGIOGRAM (N/A) as a surgical intervention .  The patient's history has been reviewed, patient examined, no change in status, stable for surgery.  I have reviewed the patient's chart and labs.  Questions were answered to the patient's satisfaction.   Cath Lab Visit (complete for each Cath Lab visit)  Clinical Evaluation Leading to the Procedure:   ACS: Yes.    Non-ACS:    Anginal Classification: CCS IV  Anti-ischemic medical therapy: No Therapy  Non-Invasive Test Results: Intermediate-risk stress test findings: cardiac mortality 1-3%/year  Prior CABG: No previous CABG        Collier Salina [facility] 03/04/2014 7:27 AM

## 2014-03-04 NOTE — H&P (View-Only) (Signed)
Darin Gomez Date of Birth  1951-04-14       [facility] [address], [address], [place], [place]  [address]   [place], [place]  [address] [place]   Fax  [phone]     Fax [phone]  Problem List: 1. Chest pain 2. Hypertension  3. Hyperlipidemia 4. Pre- Diabetes  History of Present Illness:  Darin Gomez is  a 63 yo who is seen today for further evaluation of his chest discomfort.  He has been having chest discomfort and dyspnea.  The other day, he woke up , had his coffee - had sudden feeling of shortness of breath, profuse sweats, chest pain.  Definitely worse with deep breath.  The pain lasted 30 minutes.  He has had several episodes of similar pain.    He had a pharmacological stress Myoview study at [facility] which revealed lateral ischemia. He  normal left ventricular systolic function with an ejection fraction of 55%.  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 aspirin 81 MG tablet Take 81 mg by mouth daily.      . baclofen (LIORESAL) 20 MG tablet Take 20 mg by mouth 3 (three) times daily.      . cetirizine (ZYRTEC) 10 MG tablet Take 10 mg by mouth daily as needed. For allergies      . docusate sodium (COLACE) 100 MG capsule Take 300 mg by mouth daily.       . Melatonin 10 MG CAPS Take 10 mg by mouth at bedtime.      Marland Kitchen morphine (MSIR) 30 MG tablet Take 30 mg by mouth 2 times daily at 12 noon and 4 pm.      . oxyCODONE (ROXICODONE) 15 MG immediate release tablet Take 15 mg by mouth every 4 (four) hours as needed. For break through pain      . promethazine (PHENERGAN) 12.5 MG tablet  Take 2 tablets (25 mg total) by mouth every 6 (six) hours as needed for nausea.  30 tablet  1   No current facility-administered medications on file prior to visit.    Allergies  Allergen Reactions  . Gabapentin   . Keppra [Levetiracetam] Nausea And Vomiting  . Valium [Diazepam] Other (See Comments)    Significant depression; "a different person"    Past Medical History  Diagnosis Date  . PONV (postoperative nausea and vomiting)   . Heart murmur     as child....Marland Kitchenno problems now  . Headache(784.0)     occas.  migraines  (from last surgery)  . Arthritis     mainly in back  . Hypertension     dx  10 yrs or more  ago  . Hyperlipidemia     Past Surgical History  Procedure Laterality Date  . Back surgery    . Fracture surgery      left arm  . Lumbar laminectomy/decompression microdiscectomy  03/15/2012    Procedure: LUMBAR LAMINECTOMY/DECOMPRESSION MICRODISCECTOMY 2 LEVELS;  Surgeon: Floyce Stakes, MD;  Location:  [facility];  Service: Neurosurgery;  Laterality: Bilateral;  Bilateral Lumbar three, lumbar four, lumbar five Laminectomy    History  Smoking status  . Former Smoker -- 45 years  Smokeless tobacco  . Architectural technologist  . Types: Chew    History  Alcohol Use  . 0.0 oz/week    Comment: beer  3 days a week....    Family History  Problem Relation Age of Onset  . Heart attack Mother   . Hypertension Mother   . Hyperlipidemia Mother   . Heart attack Brother 36    Reviw of Systems:  Reviewed in the HPI.  All other systems are negative.  Physical Exam: Blood pressure 128/80, pulse 65, height 6\' 1"  (1.854 m), weight 294 lb (133.358 kg). Wt Readings from Last 3 Encounters:  03/01/14 294 lb (133.358 kg)  03/15/12 253 lb (114.76 kg)  03/15/12 253 lb (114.76 kg)     General: Well developed, well nourished, in no acute distress.  Head: Normocephalic, atraumatic, sclera non-icteric, mucus membranes are moist,   Neck: Supple. Carotids are 2 + without  bruits. No JVD   Lungs: Clear   Heart: Rr, normal s1s2  Abdomen: Soft, non-tender, non-distended with normal bowel sounds.  Msk:  Strength and tone are normal   Extremities: No clubbing or cyanosis. No edema.  Distal pedal pulses are 2+ and equal    Neuro: CN II - XII intact.  Alert and oriented X 3.   Psych:  Normal   ECG:  03/01/2014: Normal sinus rhythm at 65. He has no ST or T wave changes.  Assessment / Plan:   1. Chest discomfort:  His chest discomfort since concerning. Originally I was concerned about a pulmonary embolus and is still must remain in the differential.. He's had a  positive stress test shows lateral ischemia.  We discussed the risks, benefits, and options concerning cardiac catheterization. He understands and agrees to proceed.  If his cardiac cath is negative ,  Then he needs to have a CT angiogram of the lung scheduled for the following day to look for a pulmonary and was. He has been having some left leg pain. We also could consider a venous duplex scan. Because of his back injury he is relatively inactive and spends a lot of time in the chair or couch.  He's already on aspirin. We'll add nitroglycerin sublingually as needed. His blood pressure and heart rate is too low to consider beta blocker at this time.  He scheduled for cardiac catheterization on Monday morning at 7:30 with Dr. Martinique.

## 2014-03-04 NOTE — Discharge Instructions (Signed)
Radial Site Care °Refer to this sheet in the next few weeks. These instructions provide you with information on caring for yourself after your procedure. Your caregiver may also give you more specific instructions. Your treatment has been planned according to current medical practices, but problems sometimes occur. Call your caregiver if you have any problems or questions after your procedure. °HOME CARE INSTRUCTIONS °· You may shower the day after the procedure. Remove the bandage (dressing) and gently wash the site with plain soap and water. Gently pat the site dry. °· Do not apply powder or lotion to the site. °· Do not submerge the affected site in water for 3 to 5 days. °· Inspect the site at least twice daily. °· Do not flex or bend the affected arm for 24 hours. °· No lifting over 5 pounds (2.3 kg) for 5 days after your procedure. °· Do not drive home if you are discharged the same day of the procedure. Have someone else drive you. °· You may drive 24 hours after the procedure unless otherwise instructed by your caregiver. °· Do not operate machinery or power tools for 24 hours. °· A responsible adult should be with you for the first 24 hours after you arrive home. °What to expect: °· Any bruising will usually fade within 1 to 2 weeks. °· Blood that collects in the tissue (hematoma) may be painful to the touch. It should usually decrease in size and tenderness within 1 to 2 weeks. °SEEK IMMEDIATE MEDICAL CARE IF: °· You have unusual pain at the radial site. °· You have redness, warmth, swelling, or pain at the radial site. °· You have drainage (other than a small amount of blood on the dressing). °· You have chills. °· You have a fever or persistent symptoms for more than 72 hours. °· You have a fever and your symptoms suddenly get worse. °· Your arm becomes pale, cool, tingly, or numb. °· You have heavy bleeding from the site. Hold pressure on the site. °Document Released: 06/19/2010 Document Revised:  08/09/2011 Document Reviewed: 06/19/2010 °ExitCare® Patient Information ©2015 [employer]. This information is not intended to replace advice given to you by your health care provider. Make sure you discuss any questions you have with your health care provider. ° °

## 2014-03-04 NOTE — CV Procedure (Signed)
    Cardiac Catheterization Procedure Note  Name: Darin Gomez MRN: [id number] DOB: 06/09/1950  Procedure: Left Heart Cath, Selective Coronary Angiography, LV angiography  Indication: 63 yo WM with acute chest pain. Myoview study demonstrated evidence of lateral ischemia.   Procedural Details: The right wrist was prepped, draped, and anesthetized with 1% lidocaine. Using the modified Seldinger technique, a 6 French slender sheath was introduced into the right radial artery. 3 mg of verapamil was administered through the sheath, weight-based unfractionated heparin was administered intravenously. Standard Judkins catheters were used for selective coronary angiography and left ventriculography. Catheter exchanges were performed over an exchange length guidewire. There were no immediate procedural complications. A TR band was used for radial hemostasis at the completion of the procedure.  The patient was transferred to the post catheterization recovery area for further monitoring. 80 cc of contrast was used.  Procedural Findings: Hemodynamics: AO 129/72 mean 97 mm Hg LV 125/23 mm Hg  Coronary angiography: Coronary dominance: right  Left mainstem: Normal  Left anterior descending (LAD): Normal  Left circumflex (LCx): The LCx gives rise to 2 large OM branches. There are minor irregularities in the mid vessel less than 10%.  Right coronary artery (RCA): There is mild ectasia in the mid vessel, otherwise normal.  Left ventriculography: Left ventricular systolic function is normal, LVEF is estimated at 55-65%, there is no significant mitral regurgitation   Final Conclusions:   1. No significant CAD 2. Normal LV function.  Recommendations: Will obtain a CT of the chest to evaluate noncardiac causes of chest pain.  [name], [name]  03/04/2014, 8:03 AM

## 2014-03-26 ENCOUNTER — Telehealth: Payer: Self-pay

## 2014-03-26 NOTE — Telephone Encounter (Signed)
Pt states he was told he may have sleep apnea, he has not heard anything. States it does feel like "something closes off when I sleep"

## 2014-03-26 NOTE — Telephone Encounter (Signed)
Informed patient that his labs were sent to PCP  Instructed him to contact PCP to follow up on sleep study  Patient verbalized understanding

## 2014-03-29 ENCOUNTER — Encounter: Payer: Managed Care, Other (non HMO) | Admitting: Cardiovascular Disease

## 2014-03-29 ENCOUNTER — Ambulatory Visit (INDEPENDENT_AMBULATORY_CARE_PROVIDER_SITE_OTHER): Payer: Managed Care, Other (non HMO) | Admitting: Cardiovascular Disease

## 2014-03-29 ENCOUNTER — Encounter: Payer: Self-pay | Admitting: Cardiovascular Disease

## 2014-03-29 VITALS — BP 134/80 | HR 70 | Ht 73.0 in | Wt 294.2 lb

## 2014-03-29 DIAGNOSIS — R0789 Other chest pain: Secondary | ICD-10-CM

## 2014-03-29 NOTE — Progress Notes (Signed)
Darin Gomez Date of Birth  Apr 07, 1951       [facility] [address], [place], [place]  [address]   [place], [place]  [address] [place]   Fax  [phone]     Fax [phone]  Problem List: 1. Chest pain 2. Hypertension  3. Hyperlipidemia 4. Pre- Diabetes  History of Present Illness:  Darin Gomez is  a 63 yo who is seen today for further evaluation of his chest discomfort.  He has been having chest discomfort and dyspnea.  The other day, he woke up , had his coffee - had sudden feeling of shortness of breath, profuse sweats, chest pain.  Definitely worse with deep breath.  The pain lasted 30 minutes.  He has had several episodes of similar pain.    He had a pharmacological stress Myoview study at [facility] which revealed lateral ischemia. He  normal left ventricular systolic function with an ejection fraction of 55%.  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 30, 2015:  Darin Gomez is a 63 year old gentleman who presented to me in early October with episodes of chest pain. He had a stress Myoview study which revealed lateral ischemia. Cardiac catheterization revealed only minor coronary artery or recurs. ECG in June and was performed which was negative for pulmonary embolus. It did reveal a small nodule and will need a follow up CT in 1 year.  He is doing well.  Rare CP.  Able to do his work with out problems.      Current Outpatient Prescriptions on File Prior to Visit  Medication Sig Dispense Refill  . aspirin 81 MG tablet Take 81 mg by mouth daily.      . baclofen (LIORESAL) 20 MG tablet Take 20 mg by mouth 3 (three) times daily.      . calcium carbonate (OS-CAL)  600 MG TABS tablet Take 600 mg by mouth daily with breakfast.      . cetirizine (ZYRTEC) 10 MG tablet Take 10 mg by mouth daily as needed. For allergies      . docusate sodium (COLACE) 100 MG capsule Take 300 mg by mouth daily.       Marland Kitchen lisinopril-hydrochlorothiazide (PRINZIDE,ZESTORETIC) 10-12.5 MG per tablet Take 1 tablet by mouth daily.      . meclizine (ANTIVERT) 25 MG tablet Take 25 mg by mouth as needed for dizziness.      . Melatonin 10 MG CAPS Take 10 mg by mouth at bedtime.      Marland Kitchen morphine (MSIR) 30 MG tablet Take 30 mg by mouth 2 times daily at 12 noon and 4 pm.      . Multiple Vitamins-Minerals (MULTIVITAMIN WITH MINERALS) tablet Take 1 tablet by mouth daily.      . nitroGLYCERIN (NITROSTAT) 0.4 MG SL tablet Place 1 tablet (0.4 mg total) under the tongue every 5 (five) minutes as needed for chest pain.  25 tablet  3  . oxyCODONE (ROXICODONE) 15 MG immediate release tablet Take 15 mg by mouth every 4 (four) hours as needed. For break through pain      .  pravastatin (PRAVACHOL) 80 MG tablet Take 80 mg by mouth daily.      . pregabalin (LYRICA) 75 MG capsule Take 75 mg by mouth 3 (three) times daily.      . promethazine (PHENERGAN) 12.5 MG tablet Take 2 tablets (25 mg total) by mouth every 6 (six) hours as needed for nausea.  30 tablet  1  . testosterone (ANDROGEL) 50 MG/5GM (1%) GEL Place 10 g onto the skin daily. Abdomen, shoulders, legs      . Vitamin D, Ergocalciferol, (DRISDOL) 50000 UNITS CAPS capsule Take 50,000 Units by mouth every 7 (seven) days.       No current facility-administered medications on file prior to visit.    Allergies  Allergen Reactions  . Gabapentin Swelling  . Keppra [Levetiracetam] Nausea And Vomiting  . Other     Plastic tape   . Valium [Diazepam] Other (See Comments)    Significant depression; "a different person"    Past Medical History  Diagnosis Date  . PONV (postoperative nausea and vomiting)   . Heart murmur     as child....Marland Kitchenno problems now    . Headache(784.0)     occas.  migraines  (from last surgery)  . Arthritis     mainly in back  . Hypertension     dx  10 yrs or more  ago  . Hyperlipidemia     Past Surgical History  Procedure Laterality Date  . Back surgery    . Fracture surgery      left arm  . Lumbar laminectomy/decompression microdiscectomy  03/15/2012    Procedure: LUMBAR LAMINECTOMY/DECOMPRESSION MICRODISCECTOMY 2 LEVELS;  Surgeon: Floyce Stakes, MD;  Location: [facility];  Service: Neurosurgery;  Laterality: Bilateral;  Bilateral Lumbar three, lumbar four, lumbar five Laminectomy    History  Smoking status  . Former Smoker -- 45 years  Smokeless tobacco  . Architectural technologist  . Types: Chew    History  Alcohol Use  . 0.0 oz/week    Comment: beer  3 days a week....    Family History  Problem Relation Age of Onset  . Heart attack Mother   . Hypertension Mother   . Hyperlipidemia Mother   . Heart attack Brother 66    Reviw of Systems:  Reviewed in the HPI.  All other systems are negative.  Physical Exam: Blood pressure 134/80, pulse 70, height 6\' 1"  (1.854 m), weight 294 lb 4 oz (133.471 kg). Wt Readings from Last 3 Encounters:  03/29/14 294 lb 4 oz (133.471 kg)  03/04/14 294 lb (133.358 kg)  03/04/14 294 lb (133.358 kg)     General: Well developed, well nourished, in no acute distress.  Head: Normocephalic, atraumatic, sclera non-icteric, mucus membranes are moist,   Neck: Supple. Carotids are 2 + without bruits. No JVD   Lungs: Clear   Heart: Rr, normal s1s2  Abdomen: Soft, non-tender, non-distended with normal bowel sounds.  Msk:  Strength and tone are normal   Extremities: No clubbing or cyanosis. No edema.  Distal pedal pulses are 2+ and equal    Neuro: CN II - XII intact.  Alert and oriented X 3.   Psych:  Normal   ECG:  03/01/2014: Normal sinus rhythm at 65. He has no ST or T wave changes.  Assessment / Plan:

## 2014-03-29 NOTE — Patient Instructions (Addendum)
Your physician recommends that you continue on your current medications as directed. Please refer to the Current Medication list given to you today.  Call or return to clinic prn if these symptoms worsen or fail to improve as anticipated.  Your next appointment will be scheduled in our new office located at :  [facility]  [address] [place]  [place], [place] [address]

## 2014-04-01 NOTE — Assessment & Plan Note (Signed)
Wayburn is doing well. No further episodes of CP . His cath showed no significant CAD  CT angio of the lungs was negative for PE.  We will see him back on an as needed basis.

## 2014-05-09 ENCOUNTER — Encounter (HOSPITAL_COMMUNITY): Payer: Self-pay | Admitting: Cardiology

## 2014-05-20 NOTE — Pre-Procedure Instructions (Addendum)
Darin Gomez  05/20/2014   Your procedure is scheduled on:  Wednesday May 29, 2014 at 9:00 AM.  Report to [facility] Admitting at 7:00 AM.  Call this number if you have problems the morning of surgery: [phone]  For any other questions Monday-Friday from 8am-4pm call: [phone]  Remember:   Do not eat food or drink liquids after midnight.   Take these medicines the morning of surgery with A SIP OF WATER: Baclofen (Lioresal), Cetirizine (Zyrtec), Pregabalin (Lyrica), Morphine If needed:  Meclizine (Antivert) for dizziness, Oxycodone for pain, , Promethazine for nausea, nitroGLYCERIN (NITROSTAT) for chest pain   Please stop taking vitamins ( Vitamin E),  Herbal medications ( Melatonin)  Advil, Motrin, Alleve, etc 1 week prior to surgery on 05/22/14   Do not wear jewelry.  Do not wear lotions, powders, or cologne.   Men may shave face and neck.  Do not bring valuables to the hospital.  [facility] is not responsible  for any belongings or valuables.               Contacts, dentures or bridgework may not be worn into surgery.  Leave suitcase in the car. After surgery it may be brought to your room.  For patients admitted to the hospital, discharge time is determined by your treatment team.               Patients discharged the day of surgery will not be allowed to drive home.  Name and phone number of your driver:   Special Instructions: Shower using CHG soap the night before and the morning of your surgery   Please read over the following fact sheets that you were given: Pain Booklet, Coughing and Deep Breathing and Surgical Site Infection Prevention

## 2014-05-21 ENCOUNTER — Encounter (HOSPITAL_COMMUNITY)
Admission: RE | Admit: 2014-05-21 | Discharge: 2014-05-21 | Disposition: A | Discharge status: Home / Self Care | Payer: Managed Care, Other (non HMO) | Source: Ambulatory Visit | Attending: Anesthesiology | Admitting: Anesthesiology

## 2014-05-21 ENCOUNTER — Encounter (HOSPITAL_COMMUNITY): Payer: Self-pay

## 2014-05-21 DIAGNOSIS — Z01812 Encounter for preprocedural laboratory examination: Secondary | ICD-10-CM | POA: Insufficient documentation

## 2014-05-21 DIAGNOSIS — Z7982 Long term (current) use of aspirin: Secondary | ICD-10-CM | POA: Insufficient documentation

## 2014-05-21 HISTORY — DX: Insomnia, unspecified: G47.00

## 2014-05-21 HISTORY — DX: Other reaction to spinal and lumbar puncture: G97.1

## 2014-05-21 HISTORY — DX: Other allergy status, other than to drugs and biological substances: Z91.09

## 2014-05-21 HISTORY — DX: Gastro-esophageal reflux disease without esophagitis: K21.9

## 2014-05-21 HISTORY — DX: Dizziness and giddiness: R42

## 2014-05-21 HISTORY — DX: Reserved for inherently not codable concepts without codable children: IMO0001

## 2014-05-21 HISTORY — DX: Constipation, unspecified: K59.00

## 2014-05-21 LAB — SURGICAL PCR SCREEN
MRSA, PCR: NEGATIVE
Staphylococcus aureus: NEGATIVE

## 2014-05-21 LAB — BASIC METABOLIC PANEL
Anion gap: 8 (ref 5–15)
BUN: 12 mg/dL (ref 6–23)
CALCIUM: 8.9 mg/dL (ref 8.4–10.5)
CO2: 26 mmol/L (ref 19–32)
CREATININE: 0.92 mg/dL (ref 0.50–1.35)
Chloride: 103 mEq/L (ref 96–112)
GFR, EST NON AFRICAN AMERICAN: 89 mL/min — AB (ref >90)
Glucose, Bld: 109 mg/dL — ABNORMAL HIGH (ref 70–99)
Potassium: 4 mmol/L (ref 3.5–5.1)
Sodium: 137 mmol/L (ref 135–145)

## 2014-05-21 LAB — CBC
HCT: 48.8 % (ref 39.0–52.0)
Hemoglobin: 16.6 g/dL (ref 13.0–17.0)
MCH: 31 pg (ref 26.0–34.0)
MCHC: 34 g/dL (ref 30.0–36.0)
MCV: 91 fL (ref 78.0–100.0)
PLATELETS: 225 10*3/uL (ref 150–400)
RBC: 5.36 MIL/uL (ref 4.22–5.81)
RDW: 13.8 % (ref 11.5–15.5)
WBC: 7.6 10*3/uL (ref 4.0–10.5)

## 2014-05-21 NOTE — Progress Notes (Signed)
   05/21/14 1014  OBSTRUCTIVE SLEEP APNEA  Have you ever been diagnosed with sleep apnea through a sleep study? No  Do you snore loudly (loud enough to be heard through closed doors)?  0  Do you often feel tired, fatigued, or sleepy during the daytime? 1  Has anyone observed you stop breathing during your sleep? 0  Do you have, or are you being treated for high blood pressure? 1  BMI more than 35 kg/m2? 1  Age over 63 years old? 1  Neck circumference greater than 40 cm/16 inches? 1  Gender: 1  Obstructive Sleep Apnea Score 6

## 2014-05-21 NOTE — Progress Notes (Signed)
Spoke with " Bunny," CMA at Dr. Maryjean Ka office regarding MD's pre-op  instructions for  Aspirin administration. According to [facility], it is okay for pt to stop Aspirin.

## 2014-05-21 NOTE — Progress Notes (Signed)
Pt made aware to stop taking Aspirin on 05/22/14

## 2014-05-27 ENCOUNTER — Other Ambulatory Visit: Payer: Self-pay | Admitting: Anesthesiology

## 2014-05-28 NOTE — H&P (Signed)
Darin Gomez is an 63 y.o. male.   Chief Complaint: Back and leg pain HPI: Extremely pleasant 63 year old gentleman who was diagnosed with multilevel stenosis about 3-1/2 years ago.  He had an acute worsening event while he was on a trip to [facility], and when he returned was evaluated in the office by Dr. Joya Salm; cauda equina syndrome was diagnosed given the degree of weakness in the lower extremities, and the loss of bladder control. The patient again deferred surgery, but ultimately underwent decompression and fusion in June 2012.  Since that time, the patient is continued to have significant myelopathic symptoms that have improved somewhat.  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  He utilizes a fair compendium of medication, but still has only average control of many of his symptoms.  He ultimately considered, and underwent SCS trial, after appropriate psychological evaluation.  He had better than 50% improvement in many of his symptoms.  He is considered to be a good candidate for permanent implantation.  Past Medical History  Diagnosis Date  . PONV (postoperative nausea and vomiting)   . Heart murmur     as child....Marland Kitchenno problems now  . Headache(784.0)     occas.  migraines  (from last surgery)  . Arthritis     mainly in back  . Hypertension     dx  10 yrs or more  ago  . Hyperlipidemia   . Spinal headache   . Shortness of breath dyspnea     occasional  . GERD (gastroesophageal reflux disease)   . Insomnia   . Environmental allergies   . Vertigo   . Constipation     Past Surgical History  Procedure Laterality Date  . Back surgery    . Fracture surgery      left arm  . Lumbar laminectomy/decompression microdiscectomy  03/15/2012    Procedure: LUMBAR LAMINECTOMY/DECOMPRESSION MICRODISCECTOMY 2 LEVELS;  Surgeon: Floyce Stakes, MD;  Location: [place] [facility];  Service: Neurosurgery;  Laterality: Bilateral;  Bilateral Lumbar three, lumbar four, lumbar five  Laminectomy  . Left heart catheterization with coronary angiogram N/A 03/04/2014    Procedure: LEFT HEART CATHETERIZATION WITH CORONARY ANGIOGRAM;  Surgeon: Peter M Martinique, MD;  Location: [facility];  Service: Cardiovascular;  Laterality: N/A;  . Cardiac catheterization      03/04/14    Family History  Problem Relation Age of Onset  . Heart attack Mother   . Hypertension Mother   . Hyperlipidemia Mother   . Heart attack Brother 46   Social History:  reports that he has quit smoking. His smoking use included Cigarettes. He smoked 0.00 packs per day for 45 years. He has quit using smokeless tobacco. His smokeless tobacco use included Chew. He reports that he drinks alcohol. He reports that he does not use illicit drugs.  Allergies:  Allergies  Allergen Reactions  . Gabapentin Swelling  . Keppra [Levetiracetam] Nausea And Vomiting  . Other Rash    Plastic tape: Paper tape ONLY!   . Valium [Diazepam] Other (See Comments)    Significant depression; "a different person"    Medications Prior to Admission  Medication Sig Dispense Refill  . aspirin 81 MG tablet Take 81 mg by mouth daily.    . baclofen (LIORESAL) 20 MG tablet Take 20 mg by mouth 3 (three) times daily.    . cetirizine (ZYRTEC) 10 MG tablet Take 10 mg by mouth daily. For allergies    . docusate sodium (COLACE)  100 MG capsule Take 300 mg by mouth daily.     Marland Kitchen lisinopril-hydrochlorothiazide (PRINZIDE,ZESTORETIC) 10-12.5 MG per tablet Take 1 tablet by mouth daily.    . meclizine (ANTIVERT) 25 MG tablet Take 25 mg by mouth as needed for dizziness.    . Melatonin 10 MG CAPS Take 10 mg by mouth at bedtime.    Marland Kitchen morphine (MSIR) 30 MG tablet Take 30 mg by mouth 2 times daily at 12 noon and 4 pm.    . Multiple Vitamins-Minerals (MULTIVITAMIN WITH MINERALS) tablet Take 1 tablet by mouth daily.    . nitroGLYCERIN (NITROSTAT) 0.4 MG SL tablet Place 1 tablet (0.4 mg total) under the tongue every 5 (five) minutes as needed for chest  pain. 25 tablet 3  . nystatin cream (MYCOSTATIN) Apply 1 application topically 2 (two) times daily.    Marland Kitchen oxyCODONE (ROXICODONE) 15 MG immediate release tablet Take 15 mg by mouth every 4 (four) hours as needed. For break through pain    . pravastatin (PRAVACHOL) 80 MG tablet Take 80 mg by mouth daily.    . pregabalin (LYRICA) 75 MG capsule Take 75 mg by mouth 3 (three) times daily.    . promethazine (PHENERGAN) 12.5 MG tablet Take 2 tablets (25 mg total) by mouth every 6 (six) hours as needed for nausea. 30 tablet 1  . senna-docusate (SENOKOT-S) 8.6-50 MG per tablet Take 3 tablets by mouth daily.    Marland Kitchen testosterone (TESTIM) 50 MG/5GM (1%) GEL Place 10 g onto the skin daily.    . Vitamin D, Ergocalciferol, (DRISDOL) 50000 UNITS CAPS capsule Take 50,000 Units by mouth every Monday.     . vitamin E 400 UNIT capsule Take 400 Units by mouth daily.      No results found for this or any previous visit (from the past 48 hour(s)). No results found.  Review of Systems  Constitutional: Negative.   HENT: Negative.   Eyes: Negative.   Respiratory: Negative.   Cardiovascular: Negative.   Gastrointestinal: Negative.   Genitourinary: Negative.   Musculoskeletal: Positive for back pain. Negative for myalgias, joint pain, falls and neck pain.  Skin: Negative.   Neurological: Negative.   Endo/Heme/Allergies: Negative.   Psychiatric/Behavioral: Negative.     Blood pressure 132/76, pulse 71, temperature 98.3 F (36.8 C), temperature source Oral, resp. rate 20, height 6\' 1"  (1.854 m), weight 133.216 kg (293 lb 11 oz), SpO2 95 %. Physical Exam  Constitutional: He is oriented to person, place, and time. He appears well-developed and well-nourished.  HENT:  Head: Normocephalic and atraumatic.  Eyes: EOM are normal. Pupils are equal, round, and reactive to light.  Cardiovascular: Normal rate and regular rhythm.   Neurological: He is alert and oriented to person, place, and time.  Skin: Skin is warm and  dry.  Psychiatric: He has a normal mood and affect. His behavior is normal. Judgment and thought content normal.     Assessment/Plan A: Lumbar postlaminectomy syndrome Chronic pain  P: permanent SCS implantation, [employer] 05/29/2014, 8:40 AM

## 2014-05-28 NOTE — Progress Notes (Signed)
Left message with Dr. Maryjean Ka' office for him to sign orders in epic.

## 2014-05-29 ENCOUNTER — Ambulatory Visit (HOSPITAL_COMMUNITY): Payer: Managed Care, Other (non HMO) | Admitting: Anesthesiology

## 2014-05-29 ENCOUNTER — Ambulatory Visit (HOSPITAL_COMMUNITY)
Admission: RE | Admit: 2014-05-29 | Discharge: 2014-05-29 | Disposition: A | Discharge status: Home / Self Care | Payer: Managed Care, Other (non HMO) | Source: Ambulatory Visit | Attending: Anesthesiology | Admitting: Anesthesiology

## 2014-05-29 ENCOUNTER — Ambulatory Visit (HOSPITAL_COMMUNITY): Payer: Managed Care, Other (non HMO)

## 2014-05-29 ENCOUNTER — Encounter (HOSPITAL_COMMUNITY)
Admission: RE | Disposition: A | Discharge status: Home / Self Care | Payer: Self-pay | Source: Ambulatory Visit | Attending: Anesthesiology

## 2014-05-29 DIAGNOSIS — Z7982 Long term (current) use of aspirin: Secondary | ICD-10-CM | POA: Diagnosis not present

## 2014-05-29 DIAGNOSIS — I10 Essential (primary) hypertension: Secondary | ICD-10-CM | POA: Diagnosis not present

## 2014-05-29 DIAGNOSIS — Z888 Allergy status to other drugs, medicaments and biological substances status: Secondary | ICD-10-CM | POA: Insufficient documentation

## 2014-05-29 DIAGNOSIS — M961 Postlaminectomy syndrome, not elsewhere classified: Secondary | ICD-10-CM | POA: Diagnosis present

## 2014-05-29 DIAGNOSIS — Z79899 Other long term (current) drug therapy: Secondary | ICD-10-CM | POA: Insufficient documentation

## 2014-05-29 DIAGNOSIS — M5416 Radiculopathy, lumbar region: Secondary | ICD-10-CM | POA: Diagnosis not present

## 2014-05-29 DIAGNOSIS — G8929 Other chronic pain: Secondary | ICD-10-CM | POA: Diagnosis not present

## 2014-05-29 DIAGNOSIS — E785 Hyperlipidemia, unspecified: Secondary | ICD-10-CM | POA: Insufficient documentation

## 2014-05-29 DIAGNOSIS — K219 Gastro-esophageal reflux disease without esophagitis: Secondary | ICD-10-CM | POA: Diagnosis not present

## 2014-05-29 DIAGNOSIS — Z419 Encounter for procedure for purposes other than remedying health state, unspecified: Secondary | ICD-10-CM

## 2014-05-29 DIAGNOSIS — Z87891 Personal history of nicotine dependence: Secondary | ICD-10-CM | POA: Insufficient documentation

## 2014-05-29 HISTORY — PX: SPINAL CORD STIMULATOR INSERTION: SHX5378

## 2014-05-29 SURGERY — INSERTION, SPINAL CORD STIMULATOR, LUMBAR
Anesthesia: Monitor Anesthesia Care

## 2014-05-29 MED ORDER — HYDROMORPHONE HCL 1 MG/ML IJ SOLN
0.2500 mg | INTRAMUSCULAR | Status: DC | PRN
  Administered 2014-05-29 (×2): 0.5 mg via INTRAVENOUS

## 2014-05-29 MED ORDER — CEPHALEXIN 500 MG PO CAPS: 500.0000 mg | ORAL_CAPSULE | Freq: Three times a day (TID) | ORAL | Status: DC

## 2014-05-29 MED ORDER — PROPOFOL INFUSION 10 MG/ML OPTIME
INTRAVENOUS | Status: DC | PRN
  Administered 2014-05-29: 120 ug/kg/min via INTRAVENOUS

## 2014-05-29 MED ORDER — MIDAZOLAM HCL 2 MG/2ML IJ SOLN
INTRAMUSCULAR | Status: AC
  Filled 2014-05-29: qty 2

## 2014-05-29 MED ORDER — LACTATED RINGERS IV SOLN
INTRAVENOUS | Status: DC | PRN
  Administered 2014-05-29: 09:00:00 via INTRAVENOUS

## 2014-05-29 MED ORDER — PROPOFOL 10 MG/ML IV BOLUS
INTRAVENOUS | Status: DC | PRN
  Administered 2014-05-29: 10 mg via INTRAVENOUS
  Administered 2014-05-29 (×2): 20 mg via INTRAVENOUS
  Administered 2014-05-29: 15 mg via INTRAVENOUS

## 2014-05-29 MED ORDER — OXYCODONE-ACETAMINOPHEN 5-325 MG PO TABS: 1.0000 | ORAL_TABLET | Freq: Once | ORAL | Status: DC

## 2014-05-29 MED ORDER — FENTANYL CITRATE 0.05 MG/ML IJ SOLN
INTRAMUSCULAR | Status: AC
  Filled 2014-05-29: qty 5

## 2014-05-29 MED ORDER — STERILE WATER FOR INJECTION IJ SOLN
INTRAMUSCULAR | Status: AC
  Filled 2014-05-29: qty 10

## 2014-05-29 MED ORDER — OXYCODONE-ACETAMINOPHEN 10-325 MG PO TABS
1.0000 | ORAL_TABLET | ORAL | Status: DC | PRN
Start: 2014-05-29 — End: 2017-03-04

## 2014-05-29 MED ORDER — HYDROMORPHONE HCL 1 MG/ML IJ SOLN
INTRAMUSCULAR | Status: AC
  Filled 2014-05-29: qty 1

## 2014-05-29 MED ORDER — PROPOFOL 10 MG/ML IV BOLUS
INTRAVENOUS | Status: AC
  Filled 2014-05-29: qty 20

## 2014-05-29 MED ORDER — PROMETHAZINE HCL 25 MG/ML IJ SOLN: 6.2500 mg | INTRAMUSCULAR | Status: DC | PRN

## 2014-05-29 MED ORDER — OXYCODONE HCL 5 MG PO TABS
5.0000 mg | ORAL_TABLET | Freq: Once | ORAL | Status: AC | PRN
  Administered 2014-05-29: 5 mg via ORAL

## 2014-05-29 MED ORDER — CEFAZOLIN SODIUM 1-5 GM-% IV SOLN
INTRAVENOUS | Status: AC
  Filled 2014-05-29: qty 50

## 2014-05-29 MED ORDER — BACITRACIN ZINC 500 UNIT/GM EX OINT
TOPICAL_OINTMENT | CUTANEOUS | Status: DC | PRN
  Administered 2014-05-29: 1 via TOPICAL

## 2014-05-29 MED ORDER — OXYCODONE HCL 5 MG PO TABS
ORAL_TABLET | ORAL | Status: AC
  Administered 2014-05-29: 5 mg
  Filled 2014-05-29: qty 2

## 2014-05-29 MED ORDER — DIPHENHYDRAMINE HCL 50 MG/ML IJ SOLN
INTRAMUSCULAR | Status: DC | PRN
  Administered 2014-05-29: 12.5 mg via INTRAVENOUS

## 2014-05-29 MED ORDER — OXYCODONE-ACETAMINOPHEN 5-325 MG PO TABS
ORAL_TABLET | ORAL | Status: AC
  Administered 2014-05-29: 1
  Filled 2014-05-29: qty 1

## 2014-05-29 MED ORDER — DEXTROSE 5 % IV SOLN
3.0000 g | INTRAVENOUS | Status: DC | PRN
  Administered 2014-05-29: 3 g via INTRAVENOUS

## 2014-05-29 MED ORDER — MIDAZOLAM HCL 5 MG/5ML IJ SOLN
INTRAMUSCULAR | Status: DC | PRN
  Administered 2014-05-29: 2 mg via INTRAVENOUS

## 2014-05-29 MED ORDER — OXYCODONE HCL 5 MG PO TABS: 5.0000 mg | ORAL_TABLET | Freq: Once | ORAL | Status: DC

## 2014-05-29 MED ORDER — OXYCODONE HCL 5 MG PO TABS
ORAL_TABLET | ORAL | Status: AC
  Administered 2014-05-29: 5 mg
  Filled 2014-05-29: qty 1

## 2014-05-29 MED ORDER — 0.9 % SODIUM CHLORIDE (POUR BTL) OPTIME
TOPICAL | Status: DC | PRN
  Administered 2014-05-29: 1000 mL

## 2014-05-29 MED ORDER — SODIUM CHLORIDE 0.9 % IR SOLN
Status: DC | PRN
  Administered 2014-05-29: 09:00:00

## 2014-05-29 MED ORDER — CEFAZOLIN SODIUM-DEXTROSE 2-3 GM-% IV SOLR
INTRAVENOUS | Status: AC
  Filled 2014-05-29: qty 50

## 2014-05-29 MED ORDER — OXYCODONE HCL 5 MG/5ML PO SOLN: 5.0000 mg | Freq: Once | ORAL | Status: AC | PRN

## 2014-05-29 MED ORDER — FENTANYL CITRATE 0.05 MG/ML IJ SOLN
INTRAMUSCULAR | Status: DC | PRN
  Administered 2014-05-29: 25 ug via INTRAVENOUS
  Administered 2014-05-29: 50 ug via INTRAVENOUS
  Administered 2014-05-29: 25 ug via INTRAVENOUS

## 2014-05-29 MED ORDER — BUPIVACAINE-EPINEPHRINE (PF) 0.5% -1:200000 IJ SOLN
INTRAMUSCULAR | Status: DC | PRN
Start: 2014-05-29 — End: 2014-05-29
  Administered 2014-05-29: 35 mL via PERINEURAL

## 2014-05-29 SURGICAL SUPPLY — 44 items
ADHESIVE MEDICAL (Stimulator) ×1 IMPLANT
ANCH LD 4 SET SPNL CORD STM (Anchor) ×1 IMPLANT
ANCHOR CLICK (Anchor) ×1 IMPLANT
ANCHOR CLIK NEURO F/LEAD 2 (Anchor) IMPLANT
BLADE CLIPPER SURG (BLADE) IMPLANT
CABLE/EXTENSION OR 1X16 61 (CABLE) ×2 IMPLANT
CHLORAPREP W/TINT 26ML (MISCELLANEOUS) ×2 IMPLANT
DRAPE LAPAROTOMY 100X72X124 (DRAPES) ×2 IMPLANT
DRSG OPSITE 4X5.5 SM (GAUZE/BANDAGES/DRESSINGS) ×6 IMPLANT
DRSG OPSITE POSTOP 4X6 (GAUZE/BANDAGES/DRESSINGS) ×3 IMPLANT
GLOVE BIOGEL PI IND STRL 7.5 (GLOVE) ×1 IMPLANT
GLOVE BIOGEL PI INDICATOR 7.5 (GLOVE) ×1
GLOVE ECLIPSE 7.5 STRL STRAW (GLOVE) ×3 IMPLANT
GLOVE EXAM NITRILE LRG STRL (GLOVE) IMPLANT
GLOVE EXAM NITRILE MD LF STRL (GLOVE) IMPLANT
GLOVE EXAM NITRILE XL STR (GLOVE) IMPLANT
GLOVE EXAM NITRILE XS STR PU (GLOVE) IMPLANT
GLOVE INDICATOR 8.0 STRL GRN (GLOVE) ×1 IMPLANT
GOWN STRL REUS W/ TWL LRG LVL3 (GOWN DISPOSABLE) IMPLANT
GOWN STRL REUS W/TWL 2XL LVL3 (GOWN DISPOSABLE) ×1 IMPLANT
GOWN STRL REUS W/TWL LRG LVL3 (GOWN DISPOSABLE) ×2
IPG PRECISION SPECTRA (Stimulator) ×1 IMPLANT
KIT BASIN OR (CUSTOM PROCEDURE TRAY) ×2 IMPLANT
KIT CHARGING (KITS) ×1
KIT CHARGING PRECISION NEURO (KITS) IMPLANT
KIT REMOTE CONTROL PRECISION (KITS) ×1 IMPLANT
KIT ROOM TURNOVER OR (KITS) ×2 IMPLANT
KIT SPLITTER 30CM 2X8 (Stimulator) ×2 IMPLANT
LEAD KIT CONTACT INFINION 16 (Stimulator) ×2 IMPLANT
LIQUID BAND (GAUZE/BANDAGES/DRESSINGS) ×2 IMPLANT
NDL HYPO 25X1 1.5 SAFETY (NEEDLE) ×1 IMPLANT
NEEDLE HYPO 25X1 1.5 SAFETY (NEEDLE) ×2 IMPLANT
PACK LAMINECTOMY NEURO (CUSTOM PROCEDURE TRAY) ×2 IMPLANT
PAD ARMBOARD 7.5X6 YLW CONV (MISCELLANEOUS) ×2 IMPLANT
SPONGE LAP 4X18 X RAY DECT (DISPOSABLE) IMPLANT
STAPLER VISISTAT 35W (STAPLE) ×1 IMPLANT
SUT MNCRL AB 4-0 PS2 18 (SUTURE) ×2 IMPLANT
SUT SILK 2 0 FS (SUTURE) ×1 IMPLANT
SUT VIC AB 2-0 CP2 18 (SUTURE) ×3 IMPLANT
SYR EPIDURAL 5ML GLASS (SYRINGE) ×1 IMPLANT
SYRINGE 10CC LL (SYRINGE) ×1 IMPLANT
TOWEL OR 17X24 6PK STRL BLUE (TOWEL DISPOSABLE) ×2 IMPLANT
TOWEL OR 17X26 10 PK STRL BLUE (TOWEL DISPOSABLE) ×2 IMPLANT
WATER STERILE IRR 1000ML POUR (IV SOLUTION) ×2 IMPLANT

## 2014-05-29 NOTE — Op Note (Signed)
PREOP DX: 1) lumbago  2) lumbar radiculopathy  3) lumbar post-laminectomy syndrome  4) chronic pain  POSTOP DX: 1) lumbago  2) lumbar radiculopathy  3) lumbar post-laminectomy syndrome  4) chronic pain  PROCEDURES PERFORMED:1) intraop fluoro 2) placement of 2 16 contact [employer] Infinion leads 3) placement of Spectra SCS generator  SURGEON:Keylin Podolsky  ASSISTANT: NONE  ANESTHESIA: MAC  EBL: <20cc  DESCRIPTION OF PROCEDURE: After a discussion of risks, benefits and alternatives, informed consent was obtained. The patient was taken to the OR, turned prone onto a Jackson table, all pressure points padded, SCD's placed, and an adequate plane of anesthesia induced. A timeout was taken to verify the correct patient, position, personnel, availability of appropriate equipment, and administration of perioperative antibiotics.  The thoracic and lumbar areas were widely prepped with chloraprep and draped into a sterile field. Fluoroscopy was used to plan a right paramedian incision at the L1-L3 levels, and an incision made with a 10 blade and carried down to the dorsolumbar fascia with the bovie and blunt dissection. Retractors were placed and a 14g [employer] tuohy needle placed into the epidural space at the L1-2 interspace using biplanar fluoro and loss-of-resistance technique. The needle was aspirated without any return of fluid. A [employer] INFINION lead was introduced and under live AP fluoro advanced until the distal-most contact overlay the midportion of the T8 vertebral body shadow with the rest of the contacts distributed over the T9 and T10 vertebral bodies in a position just right of anatomic midline. A second Infinion lead was placed just left of anatomic midline in the same levels using the same technique. The patient was awakened and the leads tested; impedances were good, and the patient reported good coverage with amplitudes in the 3-7 mA range. 0 silk sutures  were placed in the fascia adjacent to the needles. The needles and stylets were removed under fluoroscopy with no lead migration noted. Leads were then fixed to the fascia with Clik anchors; repeat images were obtained to verify that there had been no lead migration.  The incision was inspected and hemostasis obtained with the bipolar cautery.  Attention was then turned to creation of a subcutaneous pocket. At the right flank, a 3 cm incision was made with a 10 blade and using the bovie and blunt dissection a pocket of size appropriate to place a SCS generator. The pocket was trialed, and found to be of adequate size. The pocket was inspected for hemostasis, which was found to be excellent. Using reverse seldinger technique, the leads were tunneled to the pocket site, and the leads inserted into the SCS generator. Impedances were checked, and all found to be excellent. The leads were then all fixed into position with a self-torquing wrench. The wiring was all carefully coiled, placed behind the generator and placed in the pocket.  Both incisions were copiously irrigated with bacitracin-containing irrigation. The lumbar incision was closed in 2 deep layers of interrupted 2-0 vicryl and the skin closed with staples. The pocket incision was closed with a deeper layer of 2-0 vicryl interrupted sutures, and the skin closed with staples. Antibiotic ointment and sterile dressings were applied.   Needle, sponge, and instrument counts were correct x2 at the end of the case.    The patient was then carefully awakened from anesthesia, turned supine, an abdominal binder placed, and the patient taken to the recovery room where she underwent complex spinal cord stimulator programming.  COMPLICATIONS: NONE  CONDITION: Stable throughout the course  of the procedure and immediately afterward  DISPOSITION: discharge to home, with antibiotics and pain medicine. Discussed care with the patient and her mother. Followup  in clinic will be scheduled in 10-14 days

## 2014-05-29 NOTE — Anesthesia Postprocedure Evaluation (Signed)
  Anesthesia Post-op Note  Patient: Darin Gomez  Procedure(s) Performed: Procedure(s): LUMBAR SPINAL CORD STIMULATOR INSERTION (N/A)  Patient Location: PACU  Anesthesia Type:MAC  Level of Consciousness: awake and alert   Airway and Oxygen Therapy: Patient Spontanous Breathing  Post-op Pain: none  Post-op Assessment: Post-op Vital signs reviewed  Post-op Vital Signs: Reviewed  Last Vitals:  Filed Vitals:   05/29/14 1230  BP: 143/63  Pulse: 66  Temp: 36.7 C  Resp: 15    Complications: No apparent anesthesia complications

## 2014-05-29 NOTE — Transfer of Care (Signed)
Immediate Anesthesia Transfer of Care Note  Patient: Darin Gomez  Procedure(s) Performed: Procedure(s): LUMBAR SPINAL CORD STIMULATOR INSERTION (N/A)  Patient Location: PACU  Anesthesia Type:MAC  Level of Consciousness: awake, alert , oriented and patient cooperative  Airway & Oxygen Therapy: Patient Spontanous Breathing and Patient connected to nasal cannula oxygen  Post-op Assessment: Report given to PACU RN, Post -op Vital signs reviewed and stable and Patient moving all extremities X 4  Post vital signs: Reviewed and stable  Complications: No apparent anesthesia complications

## 2014-05-29 NOTE — Anesthesia Preprocedure Evaluation (Addendum)
Anesthesia Evaluation  Patient identified by MRN, date of birth, ID band Patient awake    Reviewed: Allergy & Precautions, H&P , NPO status , Patient's Chart, lab work & pertinent test results  History of Anesthesia Complications (+) PONV and POST - OP SPINAL HEADACHE  Airway Mallampati: II  TM Distance: >3 FB Neck ROM: Full    Dental  (+) Teeth Intact, Dental Advisory Given   Pulmonary former smoker,          Cardiovascular hypertension, Pt. on medications     Neuro/Psych  Headaches, negative psych ROS   GI/Hepatic Neg liver ROS, GERD-  ,  Endo/Other  Morbid obesity  Renal/GU negative Renal ROS     Musculoskeletal  (+) Arthritis -,   Abdominal   Peds  Hematology negative hematology ROS (+)   Anesthesia Other Findings   Reproductive/Obstetrics                           Anesthesia Physical Anesthesia Plan  ASA: III  Anesthesia Plan: MAC   Post-op Pain Management:    Induction: Intravenous  Airway Management Planned: Natural Airway and Simple Face Mask  Additional Equipment:   Intra-op Plan:   Post-operative Plan:   Informed Consent: I have reviewed the patients History and Physical, chart, labs and discussed the procedure including the risks, benefits and alternatives for the proposed anesthesia with the patient or authorized representative who has indicated his/her understanding and acceptance.     Plan Discussed with: CRNA and Surgeon  Anesthesia Plan Comments:       Anesthesia Quick Evaluation

## 2014-06-03 ENCOUNTER — Encounter (HOSPITAL_COMMUNITY): Payer: Self-pay | Admitting: Anesthesiology

## 2015-07-02 ENCOUNTER — Encounter: Payer: Self-pay | Admitting: Gastroenterology

## 2016-05-03 ENCOUNTER — Emergency Department (HOSPITAL_COMMUNITY): Payer: Managed Care, Other (non HMO)

## 2016-05-03 ENCOUNTER — Encounter (HOSPITAL_COMMUNITY): Payer: Self-pay | Admitting: *Deleted

## 2016-05-03 ENCOUNTER — Emergency Department (HOSPITAL_COMMUNITY)
Admission: EM | Admit: 2016-05-03 | Discharge: 2016-05-03 | Disposition: A | Discharge status: Home / Self Care | Payer: Managed Care, Other (non HMO) | Attending: Physician Assistant | Admitting: Physician Assistant

## 2016-05-03 DIAGNOSIS — W0110XA Fall on same level from slipping, tripping and stumbling with subsequent striking against unspecified object, initial encounter: Secondary | ICD-10-CM | POA: Insufficient documentation

## 2016-05-03 DIAGNOSIS — Y999 Unspecified external cause status: Secondary | ICD-10-CM | POA: Insufficient documentation

## 2016-05-03 DIAGNOSIS — Z7982 Long term (current) use of aspirin: Secondary | ICD-10-CM | POA: Insufficient documentation

## 2016-05-03 DIAGNOSIS — I10 Essential (primary) hypertension: Secondary | ICD-10-CM | POA: Insufficient documentation

## 2016-05-03 DIAGNOSIS — S060X0A Concussion without loss of consciousness, initial encounter: Secondary | ICD-10-CM | POA: Diagnosis not present

## 2016-05-03 DIAGNOSIS — Y9389 Activity, other specified: Secondary | ICD-10-CM | POA: Diagnosis not present

## 2016-05-03 DIAGNOSIS — Z79899 Other long term (current) drug therapy: Secondary | ICD-10-CM | POA: Diagnosis not present

## 2016-05-03 DIAGNOSIS — Z87891 Personal history of nicotine dependence: Secondary | ICD-10-CM | POA: Diagnosis not present

## 2016-05-03 DIAGNOSIS — S0990XA Unspecified injury of head, initial encounter: Secondary | ICD-10-CM | POA: Diagnosis present

## 2016-05-03 DIAGNOSIS — Y92009 Unspecified place in unspecified non-institutional (private) residence as the place of occurrence of the external cause: Secondary | ICD-10-CM | POA: Diagnosis not present

## 2016-05-03 HISTORY — DX: Polyneuropathy, unspecified: G62.9

## 2016-05-03 IMAGING — CT CT CERVICAL SPINE W/O CM
4 of 8 series · 13 of 33 positions shown, 14 images · non-contrast
Comparison: None.

CLINICAL DATA: Pt says he tripped on the doorstop Sat and landed on
the Rt side of his face. Since Sat evening he's been experiencing an
increasing headache, nausea and neck pain.

EXAM:
CT HEAD WITHOUT CONTRAST
CT CERVICAL SPINE WITHOUT CONTRAST
TECHNIQUE: Multidetector CT imaging of the head and cervical spine was
performed following the standard protocol without intravenous
contrast. Multiplanar CT image reconstructions of the cervical spine
were also generated.

[Series 203: coronal st, idose (1) · coronal · 0.40mm/px · 3 of 74 slices shown]
[im 19/74  bone]
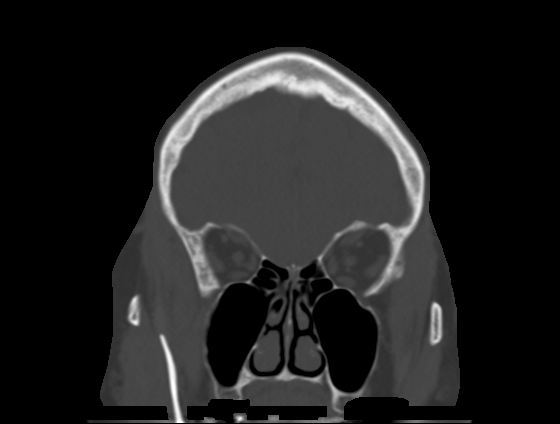
[im 37/74  bone]
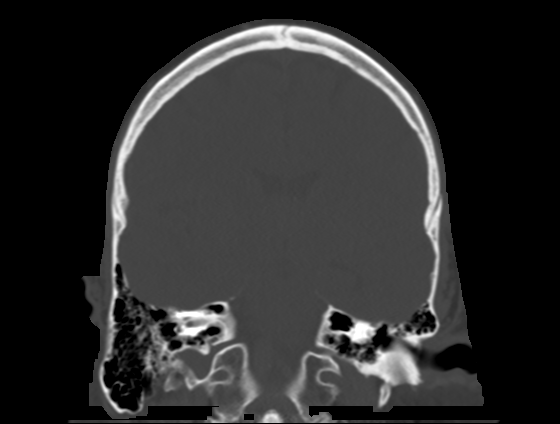
[im 55/74  bone]
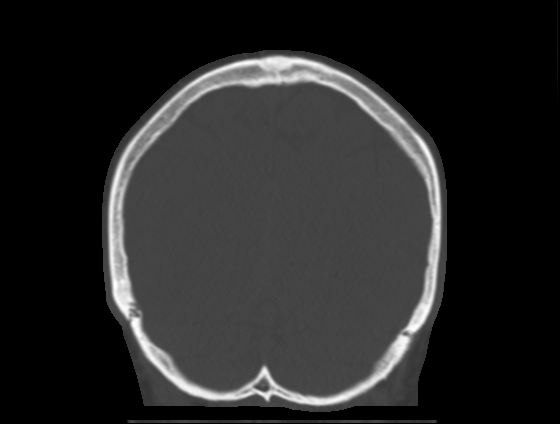

[Series 302: soft tissue, idose (2) · axial · 0.37mm/px · z∈[+142,+204]mm · 2 of 93 slices shown]
[im 31/93  soft-tissue]
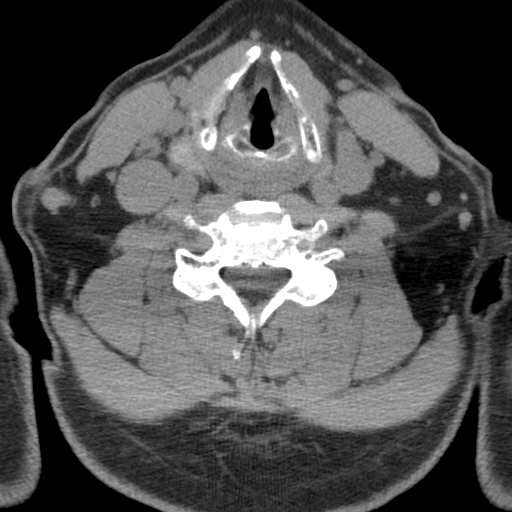
[im 62/93  soft-tissue]
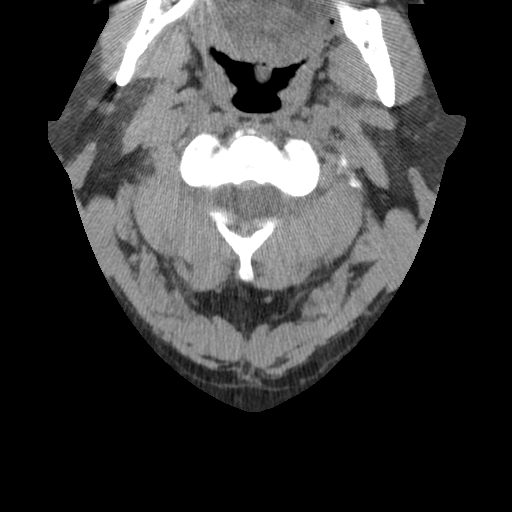

[Series 305: sagittal, idose (2) · sagittal · 0.34mm/px · 5 of 78 slices shown]
[im 13/78  bone]
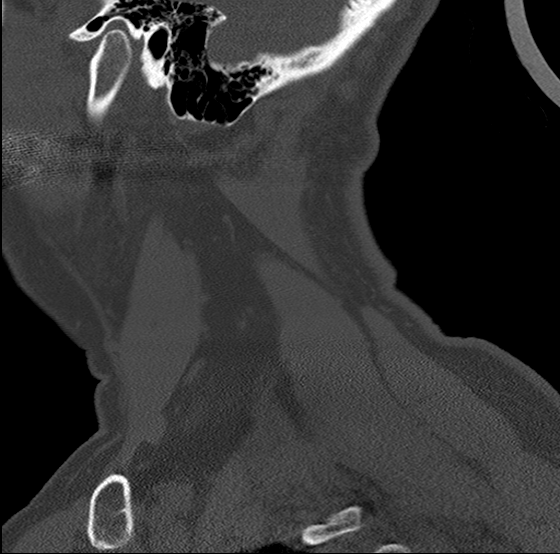
[im 26/78  bone]
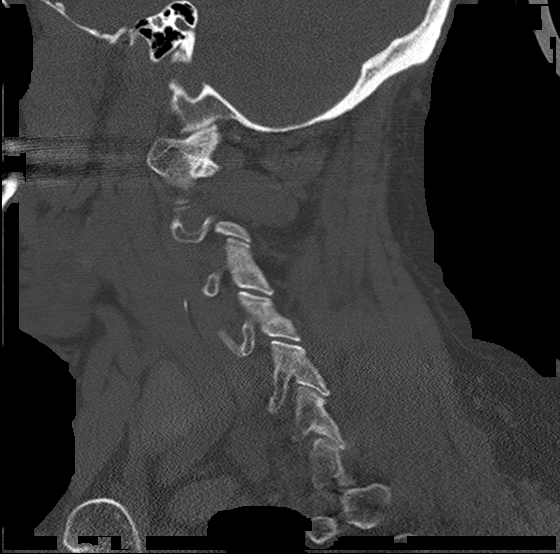
[im 39/78  bone]
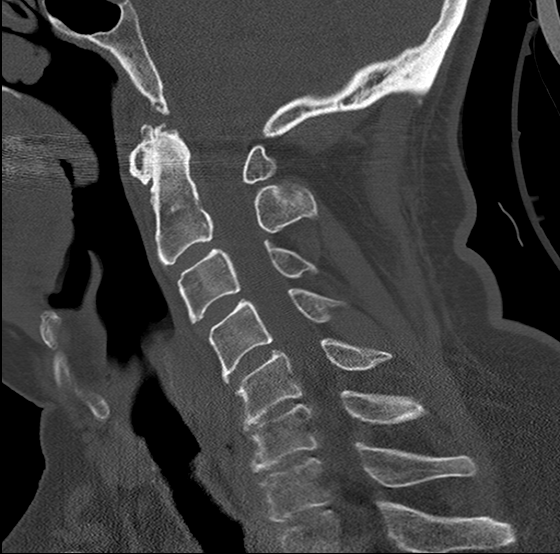
[im 52/78  bone]
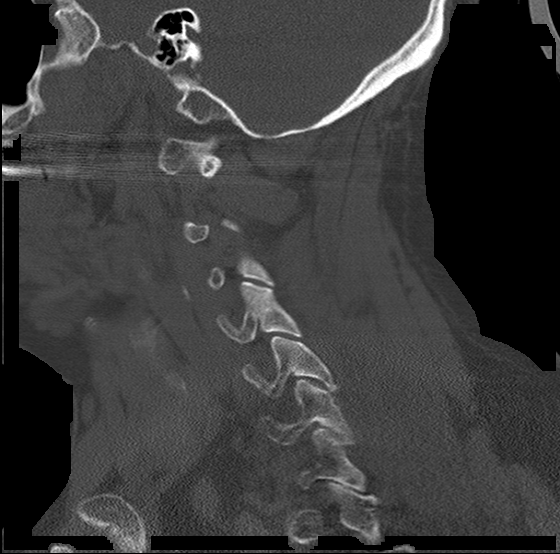
[im 65/78  bone]
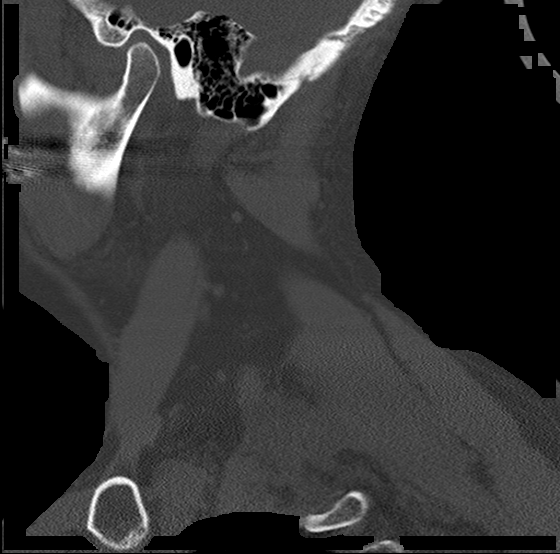

[Series 307: orthogonals, idose (2) · axial · 0.41mm/px · z∈[+102,+187]mm · 3 of 98 slices shown, 4 images]
[im 25/98  soft-tissue]
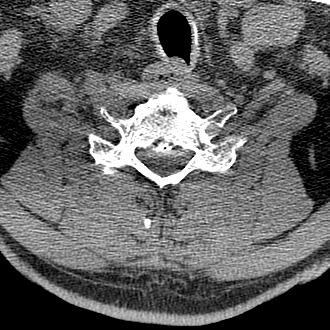
[im 25/98  bone]
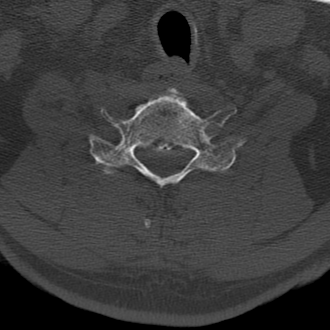
[im 49/98  bone]
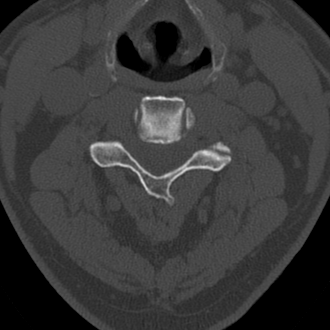
[im 73/98  bone]
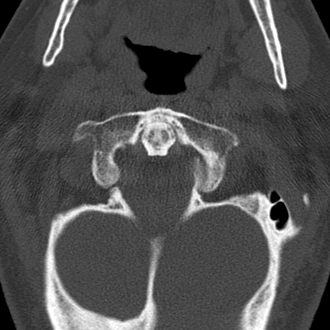

[13 of 33 positions shown; findings below may reference images not displayed]

FINDINGS: CT HEAD FINDINGS

Brain: No evidence of acute infarction, hemorrhage, hydrocephalus,
extra-axial collection or mass lesion/mass effect.

Vascular: No hyperdense vessel or unexpected calcification.

Skull: Normal. Negative for fracture or focal lesion.

Sinuses/Orbits: Normal globes and orbits. Clear sinuses and mastoid
air cells.

Other: None.

CT CERVICAL SPINE FINDINGS

Alignment: Normal.

Skull base and vertebrae: No acute fracture. No primary bone lesion
or focal pathologic process.

Soft tissues and spinal canal: No prevertebral fluid or swelling. No
visible canal hematoma.

Disc levels: Mild to moderate loss of disc height at C5-C6 and
C6-C7. Endplate spurring and mild spondylotic disc bulging noted at
these levels. Remaining disc spaces are well preserved. No
convincing disc herniation.

Upper chest: Unremarkable.

Other: None
IMPRESSION: HEAD CT:  Normal.  No skull fracture.

CERVICAL CT:  No fracture or acute finding.

## 2016-05-03 MED ORDER — ONDANSETRON 4 MG PO TBDP
ORAL_TABLET | ORAL | Status: AC
  Filled 2016-05-03: qty 1

## 2016-05-03 MED ORDER — PROCHLORPERAZINE MALEATE 5 MG PO TABS
5.0000 mg | ORAL_TABLET | Freq: Once | ORAL | Status: AC
  Administered 2016-05-03: 5 mg via ORAL
  Filled 2016-05-03: qty 1

## 2016-05-03 MED ORDER — ONDANSETRON 4 MG PO TBDP
4.0000 mg | ORAL_TABLET | Freq: Once | ORAL | Status: AC
  Administered 2016-05-03: 4 mg via ORAL

## 2016-05-03 MED ORDER — HYDROMORPHONE HCL 2 MG/ML IJ SOLN
0.5000 mg | Freq: Once | INTRAMUSCULAR | Status: AC
  Administered 2016-05-03: 0.5 mg via INTRAMUSCULAR
  Filled 2016-05-03: qty 1

## 2016-05-03 NOTE — ED Notes (Signed)
Pt back from CT

## 2016-05-03 NOTE — ED Triage Notes (Signed)
Pt states he uses a cane for unstable gait.  Sat he tripped on the doorstop and landed on the R side of his face.  Since Sat evening he's been experiencing an increasing headache, nausea (taking phenergan) and neck pain.  C-collar placed in triage. Pt ambulated to ed using cane, per norm.

## 2016-05-03 NOTE — ED Notes (Signed)
Updated patient on wait time and plan of care. No neuro changes noted. Patient does endorses leg cramping and shoulder pain from sitting in wheel chair. Patient sitting in waiting room chair now and RN instructed on leg stretches to avoid cramping.

## 2016-05-03 NOTE — ED Notes (Addendum)
Pt. To be moved to D 34.  Updated pt. On plan of care\.  Pt. .  denies any numbness or tingling to extremities..C-collar maintained.  He continues to have pain to the rt. Side of his face and neck.

## 2016-05-03 NOTE — ED Provider Notes (Signed)
[place] DEPT Provider Note   CSN: [id number] Arrival date & time: 05/03/16  1258  By signing my name below, I, Soijett Blue, attest that this documentation has been prepared under the direction and in the presence of Shemeika Starzyk Julio Alm, MD. Electronically Signed: Soijett Blue, ED Scribe. 05/03/16. 5:36 PM.  History   Chief Complaint Chief Complaint  Patient presents with  . Headache    post-fall    HPI  Darin Gomez is a 65 y.o. male with a PMHx of HTN, who presents to the Emergency Department complaining of HA localized to bilateral ears and top of scalp onset 2 days ago worsening last night. Pt notes that he fell inside his house 2 days ago due to tripping on a threshold strip prior to the onset of his HA. Pt states that he struck the right side of his face and right shoulder on the floor during the fall. Pt reports that he has had unsteady gait following multiple lower back surgeries. He states that he is having associated symptoms of nausea, upper back pain, and neck pain. He states that he has tried Rx oxycodone, ice, and MS contin with no relief for his symptoms. He denies LOC, numbness, tingling, and any other symptoms.  The history is provided by the patient and the spouse. No language interpreter was used.    Past Medical History:  Diagnosis Date  . Arthritis    mainly in back  . Constipation   . Environmental allergies   . GERD (gastroesophageal reflux disease)   . Headache(784.0)    occas.  migraines  (from last surgery)  . Heart murmur    as child....Marland Kitchenno problems now  . Hyperlipidemia   . Hypertension    dx  10 yrs or more  ago  . Insomnia   . Neuropathy ([place])   . PONV (postoperative nausea and vomiting)   . Shortness of breath dyspnea    occasional  . Spinal headache   . Vertigo     Patient Active Problem List   Diagnosis Date Noted  . Chest discomfort 03/01/2014    Past Surgical History:  Procedure Laterality Date  . BACK SURGERY    .  CARDIAC CATHETERIZATION     03/04/14  . FRACTURE SURGERY     left arm  . LEFT HEART CATHETERIZATION WITH CORONARY ANGIOGRAM N/A 03/04/2014   Procedure: LEFT HEART CATHETERIZATION WITH CORONARY ANGIOGRAM;  Surgeon: Peter M Martinique, MD;  Location: [facility] CATH LAB;  Service: Cardiovascular;  Laterality: N/A;  . LUMBAR LAMINECTOMY/DECOMPRESSION MICRODISCECTOMY  03/15/2012   Procedure: LUMBAR LAMINECTOMY/DECOMPRESSION MICRODISCECTOMY 2 LEVELS;  Surgeon: Floyce Stakes, MD;  Location: [facility] NEURO ORS;  Service: Neurosurgery;  Laterality: Bilateral;  Bilateral Lumbar three, lumbar four, lumbar five Laminectomy  . SPINAL CORD STIMULATOR INSERTION N/A 05/29/2014   Procedure: LUMBAR SPINAL CORD STIMULATOR INSERTION;  Surgeon: Bonna Gains, MD;  Location: [facility] NEURO ORS;  Service: Neurosurgery;  Laterality: N/A;       Home Medications    Prior to Admission medications   Medication Sig Start Date End Date Taking? Authorizing Provider  aspirin 81 MG tablet Take 81 mg by mouth daily.    Historical Provider, MD  baclofen (LIORESAL) 20 MG tablet Take 20 mg by mouth 3 (three) times daily.    Historical Provider, MD  cephALEXin (KEFLEX) 500 MG capsule Take 1 capsule (500 mg total) by mouth 3 (three) times daily. 05/29/14   Clydell Hakim, MD  cetirizine (ZYRTEC) 10 MG tablet Take  10 mg by mouth daily. For allergies    Historical Provider, MD  docusate sodium (COLACE) 100 MG capsule Take 300 mg by mouth daily.     Historical Provider, MD  lisinopril-hydrochlorothiazide (PRINZIDE,ZESTORETIC) 10-12.5 MG per tablet Take 1 tablet by mouth daily.    Historical Provider, MD  meclizine (ANTIVERT) 25 MG tablet Take 25 mg by mouth as needed for dizziness.    Historical Provider, MD  Melatonin 10 MG CAPS Take 10 mg by mouth at bedtime.    Historical Provider, MD  morphine (MSIR) 30 MG tablet Take 30 mg by mouth 2 times daily at 12 noon and 4 pm.    Historical Provider, MD  Multiple Vitamins-Minerals (MULTIVITAMIN WITH  MINERALS) tablet Take 1 tablet by mouth daily.    Historical Provider, MD  nitroGLYCERIN (NITROSTAT) 0.4 MG SL tablet Place 1 tablet (0.4 mg total) under the tongue every 5 (five) minutes as needed for chest pain. 03/01/14   Thayer Headings, MD  nystatin cream (MYCOSTATIN) Apply 1 application topically 2 (two) times daily.    Historical Provider, MD  oxyCODONE (ROXICODONE) 15 MG immediate release tablet Take 15 mg by mouth every 4 (four) hours as needed. For break through pain    Historical Provider, MD  oxyCODONE-acetaminophen (PERCOCET) 10-325 MG per tablet Take 1 tablet by mouth every 4 (four) hours as needed for pain. 05/29/14   Clydell Hakim, MD  pravastatin (PRAVACHOL) 80 MG tablet Take 80 mg by mouth daily.    Historical Provider, MD  pregabalin (LYRICA) 75 MG capsule Take 75 mg by mouth 3 (three) times daily.    Historical Provider, MD  promethazine (PHENERGAN) 12.5 MG tablet Take 2 tablets (25 mg total) by mouth every 6 (six) hours as needed for nausea. 03/26/12   Newman Pies, MD  senna-docusate (SENOKOT-S) 8.6-50 MG per tablet Take 3 tablets by mouth daily.    Historical Provider, MD  testosterone (TESTIM) 50 MG/5GM (1%) GEL Place 10 g onto the skin daily.    Historical Provider, MD  Vitamin D, Ergocalciferol, (DRISDOL) 50000 UNITS CAPS capsule Take 50,000 Units by mouth every Monday.     Historical Provider, MD  vitamin E 400 UNIT capsule Take 400 Units by mouth daily.    Historical Provider, MD    Family History Family History  Problem Relation Age of Onset  . Heart attack Mother   . Hypertension Mother   . Hyperlipidemia Mother   . Heart attack Brother 32    Social History Social History  Substance Use Topics  . Smoking status: Former Smoker    Years: 45.00    Types: Cigarettes  . Smokeless tobacco: Former Systems developer    Types: Chew     Comment: quit smoking cigarettes > 30 years ago  . Alcohol use 0.0 oz/week     Comment: occasional beer     Allergies   Gabapentin;  Keppra [levetiracetam]; Other; and Valium [diazepam]   Review of Systems Review of Systems  Musculoskeletal: Positive for back pain (upper).  Neurological: Positive for headaches. Negative for syncope and numbness.       No tingling     Physical Exam Updated Vital Signs BP 129/72 (BP Location: Left Arm)   Pulse 67   Temp 98.4 F (36.9 C) (Oral)   Resp 16   Ht 6\' 1"  (1.854 m)   Wt 290 lb (131.5 kg)   SpO2 93%   BMI 38.26 kg/m   Physical Exam  Constitutional: He is oriented to person,  place, and time. He appears well-developed and well-nourished. No distress.  HENT:  Head: Normocephalic and atraumatic.  No external evidence of trauma.  Eyes: EOM are normal.  Neck: Neck supple.  C-collar in place.  Cardiovascular: Normal rate.   Pulmonary/Chest: Effort normal. No respiratory distress.  Abdominal: He exhibits no distension.  Musculoskeletal: Normal range of motion.  Neurological: He is alert and oriented to person, place, and time. He has normal strength. No sensory deficit.  Nl sensation to bilateral hands. Nl strength to bilateral upper arms. Cranial nerves 2-12 intact.   No external signs of trauma  Skin: Skin is warm and dry.  Psychiatric: He has a normal mood and affect. His behavior is normal.  Nursing note and vitals reviewed.    ED Treatments / Results  DIAGNOSTIC STUDIES: Oxygen Saturation is 93% on RA, low by my interpretation.    COORDINATION OF CARE: 5:32 PM Discussed treatment plan with pt at bedside which includes dilaudid, CT head, CT cervical spine, zofran, and pt agreed to plan.  7:09 PM- Pt re-evaluated and he states that he is still having head pain at this time, and noted that dilaudid alleviated his symptoms. Will give another round of dilaudid as well as compazine in case there is a migraine component. Pt also stated that he is in pain management at this time.    Radiology Ct Head Wo Contrast  Result Date: 05/03/2016 CLINICAL DATA:  Pt says  he tripped on the doorstop Sat and landed on the Rt side of his face. Since Sat evening he's been experiencing an increasing headache, nausea and neck pain. EXAM: CT HEAD WITHOUT CONTRAST CT CERVICAL SPINE WITHOUT CONTRAST TECHNIQUE: Multidetector CT imaging of the head and cervical spine was performed following the standard protocol without intravenous contrast. Multiplanar CT image reconstructions of the cervical spine were also generated. COMPARISON:  None. FINDINGS: CT HEAD FINDINGS Brain: No evidence of acute infarction, hemorrhage, hydrocephalus, extra-axial collection or mass lesion/mass effect. Vascular: No hyperdense vessel or unexpected calcification. Skull: Normal. Negative for fracture or focal lesion. Sinuses/Orbits: Normal globes and orbits. Clear sinuses and mastoid air cells. Other: None. CT CERVICAL SPINE FINDINGS Alignment: Normal. Skull base and vertebrae: No acute fracture. No primary bone lesion or focal pathologic process. Soft tissues and spinal canal: No prevertebral fluid or swelling. No visible canal hematoma. Disc levels: Mild to moderate loss of disc height at C5-C6 and C6-C7. Endplate spurring and mild spondylotic disc bulging noted at these levels. Remaining disc spaces are well preserved. No convincing disc herniation. Upper chest: Unremarkable. Other: None IMPRESSION: HEAD CT:  Normal.  No skull fracture. CERVICAL CT:  No fracture or acute finding. Electronically Signed   By: Lajean Manes M.D.   On: 05/03/2016 18:54   Ct Cervical Spine Wo Contrast  Result Date: 05/03/2016 CLINICAL DATA:  Pt says he tripped on the doorstop Sat and landed on the Rt side of his face. Since Sat evening he's been experiencing an increasing headache, nausea and neck pain. EXAM: CT HEAD WITHOUT CONTRAST CT CERVICAL SPINE WITHOUT CONTRAST TECHNIQUE: Multidetector CT imaging of the head and cervical spine was performed following the standard protocol without intravenous contrast. Multiplanar CT image  reconstructions of the cervical spine were also generated. COMPARISON:  None. FINDINGS: CT HEAD FINDINGS Brain: No evidence of acute infarction, hemorrhage, hydrocephalus, extra-axial collection or mass lesion/mass effect. Vascular: No hyperdense vessel or unexpected calcification. Skull: Normal. Negative for fracture or focal lesion. Sinuses/Orbits: Normal globes and orbits. Clear sinuses and  mastoid air cells. Other: None. CT CERVICAL SPINE FINDINGS Alignment: Normal. Skull base and vertebrae: No acute fracture. No primary bone lesion or focal pathologic process. Soft tissues and spinal canal: No prevertebral fluid or swelling. No visible canal hematoma. Disc levels: Mild to moderate loss of disc height at C5-C6 and C6-C7. Endplate spurring and mild spondylotic disc bulging noted at these levels. Remaining disc spaces are well preserved. No convincing disc herniation. Upper chest: Unremarkable. Other: None IMPRESSION: HEAD CT:  Normal.  No skull fracture. CERVICAL CT:  No fracture or acute finding. Electronically Signed   By: Lajean Manes M.D.   On: 05/03/2016 18:54    Procedures Procedures (including critical care time)  Medications Ordered in ED Medications  ondansetron (ZOFRAN-ODT) 4 MG disintegrating tablet (not administered)  ondansetron (ZOFRAN-ODT) disintegrating tablet 4 mg (4 mg Oral Given 05/03/16 1357)    Initial Impression / Assessment and Plan / ED Course  I have reviewed the triage vital signs and the nursing notes.  Pertinent imaging results that were available during my care of the patient were reviewed by me and considered in my medical decision making (see chart for details).  Clinical Course     I personally performed the services described in this documentation, which was scribed in my presence. The recorded information has been reviewed and is accurate.    Pt is a 65 yo male with complicated PMH including chronic pain, multiple back sugeries, stimulator (for chronic back  pain) here with back pain.  This was after a fall several days ago. He has mild ha, confusion.  Will get imaging. No neurologic deficits, no red flags.  Will get imaging, and then counseled paiten nad wife extensively about concussion, early stretching and then ultimately follow up with their pain specialist, othropeadist and PCP.   I personally performed the services described in this documentation, which was scribed in my presence. The recorded information has been reviewed and is accurate.     Final Clinical Impressions(s) / ED Diagnoses   Final diagnoses:  None    New Prescriptions New Prescriptions   No medications on file     Rishan Oyama Julio Alm, MD 05/16/16 1954

## 2016-05-03 NOTE — Discharge Instructions (Signed)
You were seen today with a concussion. Please rest cognitively. Please follow-up with your neurologist. Return with any trouble speaking, nausea vomiting, trouble moving, or any other concerning symptoms.

## 2016-05-03 NOTE — ED Notes (Signed)
Spoke with Dr Arthur Holms who stated to hold CT orders until MD assesses pt.

## 2017-03-04 ENCOUNTER — Ambulatory Visit (INDEPENDENT_AMBULATORY_CARE_PROVIDER_SITE_OTHER): Payer: 59 | Admitting: Podiatry

## 2017-03-04 ENCOUNTER — Encounter: Payer: Self-pay | Admitting: Podiatry

## 2017-03-04 VITALS — BP 128/68 | HR 69 | Resp 16

## 2017-03-04 DIAGNOSIS — L03031 Cellulitis of right toe: Secondary | ICD-10-CM | POA: Diagnosis not present

## 2017-03-04 DIAGNOSIS — L600 Ingrowing nail: Secondary | ICD-10-CM | POA: Diagnosis not present

## 2017-03-04 DIAGNOSIS — M21371 Foot drop, right foot: Secondary | ICD-10-CM

## 2017-03-04 NOTE — Progress Notes (Signed)
Subjective:  [name], male    DOB: 08-Dec-1950,  MRN: [id number]  Chief Complaint  Patient presents with  . Toe Pain    Hallux right - red, swollen at base for 2 days, no treatment    66 y.o. male presents with the above complaint. Reports swelling redness and pus to the right great toe for 2 days. States this been tender for a week prior to this. Also reports extensive history of back injury with cauda equina syndrome. Endorses neuropathy, has received spinal cord stimulator which he states greatly helps. Endorses fear of falling with recent fall in January that led to a concussion.   Past Medical History:  Diagnosis Date  . Arthritis    mainly in back  . Constipation   . Environmental allergies   . GERD (gastroesophageal reflux disease)   . Headache(784.0)    occas.  migraines  (from last surgery)  . Heart murmur    as child....Marland Kitchenno problems now  . Hyperlipidemia   . Hypertension    dx  10 yrs or more  ago  . Insomnia   . Neuropathy   . PONV (postoperative nausea and vomiting)   . Shortness of breath dyspnea    occasional  . Spinal headache   . Vertigo    Past Surgical History:  Procedure Laterality Date  . BACK SURGERY    . CARDIAC CATHETERIZATION     03/04/14  . FRACTURE SURGERY     left arm  . LEFT HEART CATHETERIZATION WITH CORONARY ANGIOGRAM N/A 03/04/2014   Procedure: LEFT HEART CATHETERIZATION WITH CORONARY ANGIOGRAM;  Surgeon: Peter M Martinique, MD;  Location: [facility] CATH LAB;  Service: Cardiovascular;  Laterality: N/A;  . LUMBAR LAMINECTOMY/DECOMPRESSION MICRODISCECTOMY  03/15/2012   Procedure: LUMBAR LAMINECTOMY/DECOMPRESSION MICRODISCECTOMY 2 LEVELS;  Surgeon: Floyce Stakes, MD;  Location: [place] NEURO ORS;  Service: Neurosurgery;  Laterality: Bilateral;  Bilateral Lumbar three, lumbar four, lumbar five Laminectomy  . SPINAL CORD STIMULATOR INSERTION N/A 05/29/2014   Procedure: LUMBAR SPINAL CORD STIMULATOR INSERTION;  Surgeon: Bonna Gains, MD;   Location: [facility] NEURO ORS;  Service: Neurosurgery;  Laterality: N/A;    Current Outpatient Prescriptions:  .  aspirin EC 81 MG tablet, Take 81 mg by mouth at bedtime., Disp: , Rfl:  .  baclofen (LIORESAL) 20 MG tablet, Take 20 mg by mouth 3 (three) times daily., Disp: , Rfl:  .  bisacodyl (BISACODYL) 5 MG EC tablet, Take 5 mg by mouth daily as needed for moderate constipation., Disp: , Rfl:  .  cetirizine (ZYRTEC) 10 MG tablet, Take 10 mg by mouth daily. For allergies, Disp: , Rfl:  .  docusate sodium (COLACE) 100 MG capsule, Take 300 mg by mouth at bedtime. , Disp: , Rfl:  .  lisinopril-hydrochlorothiazide (PRINZIDE,ZESTORETIC) 10-12.5 MG per tablet, Take 1 tablet by mouth daily., Disp: , Rfl:  .  meclizine (ANTIVERT) 25 MG tablet, Take 25 mg by mouth daily as needed for dizziness. , Disp: , Rfl:  .  Melatonin 10 MG CAPS, Take 10 mg by mouth at bedtime., Disp: , Rfl:  .  morphine (MS CONTIN) 30 MG 12 hr tablet, Take 30 mg by mouth every 12 (twelve) hours., Disp: , Rfl:  .  Multiple Vitamins-Minerals (MULTIVITAMIN WITH MINERALS) tablet, Take 1 tablet by mouth daily. Spectravite, Disp: , Rfl:  .  nitroGLYCERIN (NITROSTAT) 0.4 MG SL tablet, Place 1 tablet (0.4 mg total) under the tongue every 5 (five) minutes as needed for chest pain.,  Disp: 25 tablet, Rfl: 3 .  nystatin cream (MYCOSTATIN), Apply 1 application topically daily. , Disp: , Rfl:  .  oxyCODONE (ROXICODONE) 15 MG immediate release tablet, Take 15 mg by mouth every 4 (four) hours as needed (breakthrough pain). , Disp: , Rfl:  .  pravastatin (PRAVACHOL) 80 MG tablet, Take 80 mg by mouth daily., Disp: , Rfl:  .  pregabalin (LYRICA) 75 MG capsule, Take 75 mg by mouth 3 (three) times daily., Disp: , Rfl:  .  promethazine (PHENERGAN) 12.5 MG tablet, Take 2 tablets (25 mg total) by mouth every 6 (six) hours as needed for nausea. (Patient taking differently: Take 12.5 mg by mouth every 6 (six) hours as needed for nausea. ), Disp: 30 tablet, Rfl:  1 .  RELISTOR 150 MG TABS, Take 3 tablets by mouth every morning., Disp: , Rfl: 2 .  senna-docusate (SENOKOT-S) 8.6-50 MG per tablet, Take 3 tablets by mouth See admin instructions. Take 3 tablets by mouth every other night, Disp: , Rfl:  .  sildenafil (VIAGRA) 100 MG tablet, Take 100 mg by mouth daily as needed for erectile dysfunction., Disp: , Rfl:  .  testosterone (TESTIM) 50 MG/5GM (1%) GEL, Place 10 g onto the skin daily., Disp: , Rfl:  .  Vitamin D, Ergocalciferol, (DRISDOL) 50000 UNITS CAPS capsule, Take 50,000 Units by mouth every Monday. , Disp: , Rfl:   Allergies  Allergen Reactions  . Gabapentin Swelling  . Keppra [Levetiracetam] Nausea And Vomiting  . Other Rash    Plastic tape: Paper tape ONLY!   . Valium [Diazepam] Other (See Comments)    Significant depression; "a different person"   Review of Systems Objective:   Vitals:   03/04/17 1415  BP: 128/68  Pulse: 69  Resp: 16   General AA&O x3. Normal mood and affect.  Vascular Dorsalis pedis and posterior tibial pulses  present 2+ bilaterally  Capillary refill normal to all digits. Pedal hair growth normal. Varicosities present in both legs with hemosiderin staining and thin shiny skin .  Neurologic Epicritic sensation grossly present. Vibratory sensation absent. Protective sensation 1 out of 5 bilaterally.  Dermatologic No open lesions. Interspaces clear of maceration. Right hallux nail ingrowing medial and lateral aspects with periungual erythema, slight purulent drainage .  Orthopedic: MMT 3/5 dorsiflexion right, 4/5 PF,Inv,Ev right. 4/5 DF left Normal joint ROM without pain or crepitus.   Assessment & Plan:  Patient was evaluated and treated and all questions answered.  Dropfoot right -Educated on etiology of dropfoot and its effects. -Discussed obtaining for patient dorsiflexion assist brace.  -Advised patient to discuss with his neurologist.  Paronychia R Hallux, Ingrown Nail L Hallux -Ingrown nail  avulsed. See procedure note. -Educated on post-procedure care including soaking. Written instructions provided. -Patient to follow up in 2 weeks for nail check. -Left hallux nail avulsed in slight backed fashion  Procedure: Excision of Ingrown Toenail Location: Right 1st toe Both nail borders. Anesthesia: Lidocaine 1% plain; 27mL and Marcaine 0.25% plain; 60mL, digital block. Skin Prep: Betadine. Dressing: Povidone ointment; telfa; dry, sterile, compression dressing. Technique: Following skin prep, the toe was exsanguinated and a tourniquet was secured at the base of the toe. The affected nail border was freed, split with a nail splitter, and excised. The toe was irrigated with alcohol. The tourniquet was then removed and sterile dressing applied. Disposition: Patient tolerated procedure well. Patient to return in 2 weeks for follow-up.  Procedure: Nail Debridement Rationale: painful ingrowing nail Type of Debridement: manual, sharp  debridement in slant back fashion. Instrumentation: Nail nipper Number of Nails: 1  Return in about 2 weeks (around 03/18/2017).

## 2017-03-04 NOTE — Patient Instructions (Signed)

## 2017-03-17 ENCOUNTER — Ambulatory Visit: Payer: Managed Care, Other (non HMO) | Admitting: Podiatry

## 2017-03-17 ENCOUNTER — Ambulatory Visit (INDEPENDENT_AMBULATORY_CARE_PROVIDER_SITE_OTHER): Payer: Managed Care, Other (non HMO) | Admitting: Podiatry

## 2017-03-17 ENCOUNTER — Encounter: Payer: Self-pay | Admitting: Podiatry

## 2017-03-17 DIAGNOSIS — Z9889 Other specified postprocedural states: Secondary | ICD-10-CM

## 2017-03-21 NOTE — Progress Notes (Signed)
  Subjective:  [name], male    DOB: 1951/02/02,  MRN: [id number]  Chief Complaint  Patient presents with  . Nail Problem    2 wk f/u - Right hallux, doing better   66 y.o. male returns for the above complaint. Has been soaking the toe as directed. No complaints.  Objective:   General AA&O x3. Normal mood and affect.  Vascular Foot warm and well perfused with good capillary refill.  Neurologic Sensation grossly intact.  Dermatologic Nail avulsion site healing well without drainage or erythema. Nail bed with overlying soft crust. Left intact. No signs of local infection.  Orthopedic: No tenderness to palpation of the toe.   Assessment & Plan:  Patient was evaluated and treated and all questions answered.  S/p Ingrown Toenail Avulsion, Right -Healing well without issue. -Discussed return precautions. -F/u PRN

## 2017-04-12 ENCOUNTER — Ambulatory Visit: Payer: 59 | Admitting: Podiatry

## 2017-04-12 DIAGNOSIS — L600 Ingrowing nail: Secondary | ICD-10-CM

## 2017-04-12 NOTE — Patient Instructions (Signed)

## 2017-04-24 ENCOUNTER — Encounter: Payer: Self-pay | Admitting: Podiatry

## 2017-04-24 NOTE — Progress Notes (Signed)
  Subjective:  [name], male    DOB: 05/11/1951,  MRN: [id number]  Chief Complaint  Patient presents with  . Nail Problem    left 2st toe medial border still having pain    66 y.o. male returns for the above complaint.  States the right great toe is better.  Still having issues with the left great toe.  Still says that the inside border is still very tender  Objective:  There were no vitals filed for this visit. General AA&O x3. Normal mood and affect.  Vascular Dorsalis pedis and posterior tibial pulses  present 2+ bilaterally  Capillary refill normal to all digits. Pedal hair growth normal.  Neurologic Epicritic sensation grossly present.  Dermatologic No open lesions. Interspaces clear of maceration. Nails well groomed and normal in appearance. Painful ingrowing nail at hallux nail left medial border.  Orthopedic: MMT 5/5 in dorsiflexion, plantarflexion, inversion, and eversion. Normal joint ROM without pain or crepitus.   Assessment & Plan:  Patient was evaluated and treated and all questions answered.  Ingrown Nail, left -Patient elects to proceed with ingrown toenail removal today -Ingrown nail excised. See procedure note. -Educated on post-procedure care including soaking. Written instructions provided. -Patient to follow up in 2 weeks for nail check.  Procedure: Excision of Ingrown Toenail Location: Left 1st toe medial nail borders. Anesthesia: Lidocaine 1% plain; 1.16mL and Marcaine 0.5% plain; 1.47mL, digital block. Skin Prep: Alcohol. Dressing: Silvadene; telfa; dry, sterile, compression dressing. Technique: Following skin prep, the toe was exsanguinated and a tourniquet was secured at the base of the toe. The affected nail border was freed, split with a nail splitter, and excised. Chemical matrixectomy was then performed with phenol and irrigated out with alcohol. The tourniquet was then removed and sterile dressing applied. Disposition: Patient  tolerated procedure well. Patient to return in 2 weeks for follow-up.     Return in about 2 weeks (around 04/26/2017) for left 1st nail check .

## 2017-04-26 ENCOUNTER — Ambulatory Visit: Payer: 59 | Admitting: Podiatry

## 2017-05-03 ENCOUNTER — Ambulatory Visit (INDEPENDENT_AMBULATORY_CARE_PROVIDER_SITE_OTHER): Payer: 59 | Admitting: Podiatry

## 2017-05-03 DIAGNOSIS — Z9889 Other specified postprocedural states: Secondary | ICD-10-CM

## 2017-05-29 NOTE — Progress Notes (Signed)
  Subjective:  [name], male    DOB: [birth year],  MRN: [id number]  Chief Complaint  Patient presents with  . Nail Problem    nail check left   doing good   . Nail Problem    wants nails cut  not diabetic no anticoagulant    66 y.o. male returns for the above complaint.   Objective:   General AA&O x3. Normal mood and affect.  Vascular Foot warm and well perfused with good capillary refill.  Neurologic Sensation grossly intact.  Dermatologic Nail avulsion site healing well without drainage or erythema. Nail bed with overlying soft crust. Left intact. No signs of local infection.  Orthopedic: No tenderness to palpation of the toe.   Assessment & Plan:  Patient was evaluated and treated and all questions answered.  S/p Ingrown Toenail Excision, left -Healing well without issue. -Discussed return precautions. -F/u PRN

## 2017-09-21 ENCOUNTER — Encounter: Payer: Self-pay | Admitting: Gastroenterology

## 2017-10-27 ENCOUNTER — Encounter: Payer: Self-pay | Admitting: Podiatry

## 2017-10-27 ENCOUNTER — Ambulatory Visit: Payer: 59 | Admitting: Podiatry

## 2017-10-27 DIAGNOSIS — M21371 Foot drop, right foot: Secondary | ICD-10-CM | POA: Diagnosis not present

## 2017-10-27 DIAGNOSIS — M79676 Pain in unspecified toe(s): Secondary | ICD-10-CM

## 2017-10-27 DIAGNOSIS — L600 Ingrowing nail: Secondary | ICD-10-CM | POA: Diagnosis not present

## 2017-10-27 DIAGNOSIS — M792 Neuralgia and neuritis, unspecified: Secondary | ICD-10-CM | POA: Diagnosis not present

## 2017-10-27 NOTE — Patient Instructions (Signed)

## 2017-10-27 NOTE — Progress Notes (Signed)
Subjective:  [name], male    DOB: 02/01/1951,  MRN: [id number]  Chief Complaint  Patient presents with  . Nail Problem    recheck right great toenail   67 y.o. male presents with and ingrown toenail to the right great toe. States the nail has been growing back and is very inflamed. Separate complaint of R foot neuropathy, concern for falls.  Past Medical History:  Diagnosis Date  . Arthritis    mainly in back  . Constipation   . Environmental allergies   . GERD (gastroesophageal reflux disease)   . Headache(784.0)    occas.  migraines  (from last surgery)  . Heart murmur    as child....Marland Kitchenno problems now  . Hyperlipidemia   . Hypertension    dx  10 yrs or more  ago  . Insomnia   . Neuropathy   . PONV (postoperative nausea and vomiting)   . Shortness of breath dyspnea    occasional  . Spinal headache   . Vertigo    Past Surgical History:  Procedure Laterality Date  . BACK SURGERY    . CARDIAC CATHETERIZATION     03/04/14  . FRACTURE SURGERY     left arm  . LEFT HEART CATHETERIZATION WITH CORONARY ANGIOGRAM N/A 03/04/2014   Procedure: LEFT HEART CATHETERIZATION WITH CORONARY ANGIOGRAM;  Surgeon: Peter M Martinique, MD;  Location: [facility] CATH LAB;  Service: Cardiovascular;  Laterality: N/A;  . LUMBAR LAMINECTOMY/DECOMPRESSION MICRODISCECTOMY  03/15/2012   Procedure: LUMBAR LAMINECTOMY/DECOMPRESSION MICRODISCECTOMY 2 LEVELS;  Surgeon: Floyce Stakes, MD;  Location: [place] NEURO ORS;  Service: Neurosurgery;  Laterality: Bilateral;  Bilateral Lumbar three, lumbar four, lumbar five Laminectomy  . SPINAL CORD STIMULATOR INSERTION N/A 05/29/2014   Procedure: LUMBAR SPINAL CORD STIMULATOR INSERTION;  Surgeon: Bonna Gains, MD;  Location: [facility] NEURO ORS;  Service: Neurosurgery;  Laterality: N/A;    Current Outpatient Medications:  .  aspirin EC 81 MG tablet, Take 81 mg by mouth at bedtime., Disp: , Rfl:  .  baclofen (LIORESAL) 20 MG tablet, Take 20 mg by mouth 3 (three)  times daily., Disp: , Rfl:  .  bisacodyl (BISACODYL) 5 MG EC tablet, Take 5 mg by mouth daily as needed for moderate constipation., Disp: , Rfl:  .  cetirizine (ZYRTEC) 10 MG tablet, Take 10 mg by mouth daily. For allergies, Disp: , Rfl:  .  docusate sodium (COLACE) 100 MG capsule, Take 300 mg by mouth at bedtime. , Disp: , Rfl:  .  lisinopril-hydrochlorothiazide (PRINZIDE,ZESTORETIC) 10-12.5 MG per tablet, Take 1 tablet by mouth daily., Disp: , Rfl:  .  meclizine (ANTIVERT) 25 MG tablet, Take 25 mg by mouth daily as needed for dizziness. , Disp: , Rfl:  .  Melatonin 10 MG CAPS, Take 10 mg by mouth at bedtime., Disp: , Rfl:  .  morphine (MS CONTIN) 30 MG 12 hr tablet, Take 30 mg by mouth every 12 (twelve) hours., Disp: , Rfl:  .  Multiple Vitamins-Minerals (MULTIVITAMIN WITH MINERALS) tablet, Take 1 tablet by mouth daily. Spectravite, Disp: , Rfl:  .  nitroGLYCERIN (NITROSTAT) 0.4 MG SL tablet, Place 1 tablet (0.4 mg total) under the tongue every 5 (five) minutes as needed for chest pain., Disp: 25 tablet, Rfl: 3 .  nystatin cream (MYCOSTATIN), Apply 1 application topically daily. , Disp: , Rfl:  .  oxyCODONE (ROXICODONE) 15 MG immediate release tablet, Take 15 mg by mouth every 4 (four) hours as needed (breakthrough pain). , Disp: ,  Rfl:  .  pravastatin (PRAVACHOL) 80 MG tablet, Take 80 mg by mouth daily., Disp: , Rfl:  .  pregabalin (LYRICA) 75 MG capsule, Take 75 mg by mouth 3 (three) times daily., Disp: , Rfl:  .  promethazine (PHENERGAN) 12.5 MG tablet, Take 2 tablets (25 mg total) by mouth every 6 (six) hours as needed for nausea. (Patient taking differently: Take 12.5 mg by mouth every 6 (six) hours as needed for nausea. ), Disp: 30 tablet, Rfl: 1 .  RELISTOR 150 MG TABS, Take 3 tablets by mouth every morning., Disp: , Rfl: 2 .  senna-docusate (SENOKOT-S) 8.6-50 MG per tablet, Take 3 tablets by mouth See admin instructions. Take 3 tablets by mouth every other night, Disp: , Rfl:  .   sildenafil (VIAGRA) 100 MG tablet, Take 100 mg by mouth daily as needed for erectile dysfunction., Disp: , Rfl:  .  testosterone (TESTIM) 50 MG/5GM (1%) GEL, Place 10 g onto the skin daily., Disp: , Rfl:  .  Vitamin D, Ergocalciferol, (DRISDOL) 50000 UNITS CAPS capsule, Take 50,000 Units by mouth every Monday. , Disp: , Rfl:   Allergies  Allergen Reactions  . Gabapentin Swelling  . Keppra [Levetiracetam] Nausea And Vomiting  . Other Rash    Plastic tape: Paper tape ONLY!   . Valium [Diazepam] Other (See Comments)    Significant depression; "a different person"   Review of Systems Objective:  There were no vitals filed for this visit. General AA&O x3. Normal mood and affect.  Vascular Dorsalis pedis and posterior tibial pulses  present 2+ bilaterally  Capillary refill normal to all digits. Pedal hair growth normal.  Neurologic Epicritic sensation grossly present. Sensation intact to the level of the digits.  Dermatologic No open lesions. Interspaces clear of maceration. Nails well groomed and normal in appearance. Painful ingrowing nail at both nail borders of the hallux nail right.  Orthopedic: MMT 5/5 in dorsiflexion, plantarflexion, inversion, and eversion L, 2/5 DF Right. Normal joint ROM without pain or crepitus. Pain to palpation about the ingrown nail.   Assessment & Plan:  Patient was evaluated and treated and all questions answered.  Ingrown Nail, right -Patient elects to proceed with ingrown toenail removal today -Ingrown nail excised. See procedure note. -Educated on post-procedure care including soaking. Written instructions provided. -Patient to follow up in 2 weeks for nail check.  Procedure: Excision of Ingrown Toenail Location: Right 1st toe both nail borders. Anesthesia: Lidocaine 1% plain; 1.5 mL and Marcaine 0.5% plain; 1.5 mL, digital block. Skin Prep: Betadine. Dressing: Silvadene; telfa; dry, sterile, compression dressing. Technique: Following skin  prep, the toe was exsanguinated and a tourniquet was secured at the base of the toe. The affected nail border was freed, split with a nail splitter, and excised. Chemical matrixectomy was then performed with phenol and irrigated out with alcohol. The tourniquet was then removed and sterile dressing applied. Disposition: Patient tolerated procedure well. Patient to return in 2 weeks for follow-up.   Return in about 2 weeks (around 11/10/2017) for Nail Check.

## 2017-11-10 ENCOUNTER — Ambulatory Visit (INDEPENDENT_AMBULATORY_CARE_PROVIDER_SITE_OTHER): Payer: 59 | Admitting: Podiatry

## 2017-11-10 DIAGNOSIS — L600 Ingrowing nail: Secondary | ICD-10-CM

## 2017-11-24 ENCOUNTER — Ambulatory Visit (AMBULATORY_SURGERY_CENTER): Payer: Self-pay | Admitting: *Deleted

## 2017-11-24 ENCOUNTER — Other Ambulatory Visit: Payer: Self-pay

## 2017-11-24 VITALS — Ht 73.0 in | Wt 311.6 lb

## 2017-11-24 DIAGNOSIS — Z1211 Encounter for screening for malignant neoplasm of colon: Secondary | ICD-10-CM

## 2017-11-24 MED ORDER — SUPREP BOWEL PREP KIT 17.5-3.13-1.6 GM/177ML PO SOLN: 1.0000 | Freq: Once | ORAL | 0 refills | Status: AC

## 2017-11-24 NOTE — Progress Notes (Signed)
No egg or soy allergy known to patient  No issues with past sedation with any surgeries  or procedures, no intubation problems  No diet pills per patient No home 02 use per patient  No blood thinners per patient  Pt  has issues with constipation  No A fib or A flutter  EMMI video  Offered, p[atient declined

## 2017-11-25 ENCOUNTER — Encounter: Payer: Self-pay | Admitting: Gastroenterology

## 2017-11-27 NOTE — Progress Notes (Signed)
  Subjective:  [name], male    DOB: [birth year],  MRN: [id number]  Chief Complaint  Patient presents with  . Ingrown Toenail    left ingrown follow up - concerned about infection   67 y.o. male returns for the above complaint.   Objective:   General AA&O x3. Normal mood and affect.  Vascular Foot warm and well perfused with good capillary refill.  Neurologic Sensation grossly intact.  Dermatologic Nail avulsion site healing well without drainage or erythema. Nail bed with overlying soft crust. Left intact. No signs of local infection.  Orthopedic: No tenderness to palpation of the toe.   Assessment & Plan:  Patient was evaluated and treated and all questions answered.  S/p Ingrown Toenail Excision, left -Healing well without issue. -Discussed return precautions. -F/u PRN

## 2017-12-05 DIAGNOSIS — M25561 Pain in right knee: Secondary | ICD-10-CM | POA: Insufficient documentation

## 2017-12-09 ENCOUNTER — Encounter: Payer: Managed Care, Other (non HMO) | Admitting: Gastroenterology

## 2018-01-02 HISTORY — PX: KNEE SURGERY: SHX244

## 2018-02-14 ENCOUNTER — Ambulatory Visit (AMBULATORY_SURGERY_CENTER): Payer: 59 | Admitting: Gastroenterology

## 2018-02-14 ENCOUNTER — Encounter: Payer: Self-pay | Admitting: Gastroenterology

## 2018-02-14 VITALS — BP 131/69 | HR 60 | Temp 99.1°F | Resp 16 | Ht 73.0 in | Wt 311.0 lb

## 2018-02-14 DIAGNOSIS — D123 Benign neoplasm of transverse colon: Secondary | ICD-10-CM

## 2018-02-14 DIAGNOSIS — K5790 Diverticulosis of intestine, part unspecified, without perforation or abscess without bleeding: Secondary | ICD-10-CM

## 2018-02-14 DIAGNOSIS — Z860100 Personal history of colon polyps, unspecified: Secondary | ICD-10-CM

## 2018-02-14 DIAGNOSIS — Z1211 Encounter for screening for malignant neoplasm of colon: Secondary | ICD-10-CM

## 2018-02-14 DIAGNOSIS — D124 Benign neoplasm of descending colon: Secondary | ICD-10-CM

## 2018-02-14 DIAGNOSIS — K635 Polyp of colon: Secondary | ICD-10-CM

## 2018-02-14 HISTORY — DX: Diverticulosis of intestine, part unspecified, without perforation or abscess without bleeding: K57.90

## 2018-02-14 HISTORY — DX: Personal history of colonic polyps: Z86.010

## 2018-02-14 HISTORY — DX: Personal history of colon polyps, unspecified: Z86.0100

## 2018-02-14 MED ORDER — SODIUM CHLORIDE 0.9 % IV SOLN: 500.0000 mL | Freq: Once | INTRAVENOUS | Status: DC

## 2018-02-14 NOTE — Op Note (Signed)
[place] Patient Name: Bertha Earwood Procedure Date: 02/14/2018 8:05 AM MRN: [id number] Endoscopist: Ladene Artist , MD Age: 67 Referring MD:  Date of Birth: 1950/09/21 Gender: Male Account #: [id number] Procedure:                Colonoscopy Indications:              Screening for colorectal malignant neoplasm Medicines:                Monitored Anesthesia Care Procedure:                Pre-Anesthesia Assessment:                           - Prior to the procedure, a History and Physical                            was performed, and patient medications and                            allergies were reviewed. The patient's tolerance of                            previous anesthesia was also reviewed. The risks                            and benefits of the procedure and the sedation                            options and risks were discussed with the patient.                            All questions were answered, and informed consent                            was obtained. Prior Anticoagulants: The patient has                            taken no previous anticoagulant or antiplatelet                            agents. ASA Grade Assessment: II - A patient with                            mild systemic disease. After reviewing the risks                            and benefits, the patient was deemed in                            satisfactory condition to undergo the procedure.                           After obtaining informed consent, the colonoscope  was passed under direct vision. Throughout the                            procedure, the patient's blood pressure, pulse, and                            oxygen saturations were monitored continuously. The                            Colonoscope was introduced through the anus and                            advanced to the the cecum, identified by                            appendiceal orifice and  ileocecal valve. The                            ileocecal valve, appendiceal orifice, and rectum                            were photographed. The quality of the bowel                            preparation was good. The colonoscopy was performed                            without difficulty. The patient tolerated the                            procedure well. Scope In: 8:09:18 AM Scope Out: 8:23:21 AM Scope Withdrawal Time: 0 hours 12 minutes 10 seconds  Total Procedure Duration: 0 hours 14 minutes 3 seconds  Findings:                 The perianal and digital rectal examinations were                            normal.                           Two sessile polyps were found in the descending                            colon and transverse colon. The polyps were 6 to 8                            mm in size. These polyps were removed with a cold                            snare. Resection and retrieval were complete.                           Multiple small-mouthed diverticula were found in  the left colon. There was no evidence of                            diverticular bleeding.                           The exam was otherwise without abnormality on                            direct and retroflexion views. Complications:            No immediate complications. Estimated blood loss:                            None. Estimated Blood Loss:     Estimated blood loss: none. Impression:               - Two 6 to 8 mm polyps in the descending colon and                            in the transverse colon, removed with a cold snare.                            Resected and retrieved.                           - Moderate diverticulosis in the left colon. There                            was no evidence of diverticular bleeding.                           - The examination was otherwise normal on direct                            and retroflexion views. Recommendation:            - Repeat colonoscopy in 5 years for surveillance if                            polyp(s) are precancerous.                           - Patient has a contact number available for                            emergencies. The signs and symptoms of potential                            delayed complications were discussed with the                            patient. Return to normal activities tomorrow.                            Written discharge instructions were provided to  the                            patient.                           - High fiber diet.                           - Continue present medications.                           - Await pathology results. Ladene Artist, MD 02/14/2018 8:30:03 AM This report has been signed electronically.

## 2018-02-14 NOTE — Progress Notes (Signed)
To PACU, VSS. Report to [email or web address] 

## 2018-02-14 NOTE — Patient Instructions (Signed)
Please read handouts on Polyps, Diverticulosis, and high-fiber diet. Continue present medications.    YOU HAD AN ENDOSCOPIC PROCEDURE TODAY AT [facility]:   Refer to the procedure report that was given to you for any specific questions about what was found during the examination.  If the procedure report does not answer your questions, please call your gastroenterologist to clarify.  If you requested that your care partner not be given the details of your procedure findings, then the procedure report has been included in a sealed envelope for you to review at your convenience later.  YOU SHOULD EXPECT: Some feelings of bloating in the abdomen. Passage of more gas than usual.  Walking can help get rid of the air that was put into your GI tract during the procedure and reduce the bloating. If you had a lower endoscopy (such as a colonoscopy or flexible sigmoidoscopy) you may notice spotting of blood in your stool or on the toilet paper. If you underwent a bowel prep for your procedure, you may not have a normal bowel movement for a few days.  Please Note:  You might notice some irritation and congestion in your nose or some drainage.  This is from the oxygen used during your procedure.  There is no need for concern and it should clear up in a day or so.  SYMPTOMS TO REPORT IMMEDIATELY:   Following lower endoscopy (colonoscopy or flexible sigmoidoscopy):  Excessive amounts of blood in the stool  Significant tenderness or worsening of abdominal pains  Swelling of the abdomen that is new, acute  Fever of 100F or higher     For urgent or emergent issues, a gastroenterologist can be reached at any hour by calling [phone].   DIET:  We do recommend a small meal at first, but then you may proceed to your regular diet.  Drink plenty of fluids but you should avoid alcoholic beverages for 24 hours.  ACTIVITY:  You should plan to take it easy for the rest of today and you  should NOT DRIVE or use heavy machinery until tomorrow (because of the sedation medicines used during the test).    FOLLOW UP: Our staff will call the number listed on your records the next business day following your procedure to check on you and address any questions or concerns that you may have regarding the information given to you following your procedure. If we do not reach you, we will leave a message.  However, if you are feeling well and you are not experiencing any problems, there is no need to return our call.  We will assume that you have returned to your regular daily activities without incident.  If any biopsies were taken you will be contacted by phone or by letter within the next 1-3 weeks.  Please call us at [phone] if you have not heard about the biopsies in 3 weeks.    SIGNATURES/CONFIDENTIALITY: You and/or your care partner have signed paperwork which will be entered into your electronic medical record.  These signatures attest to the fact that that the information above on your After Visit Summary has been reviewed and is understood.  Full responsibility of the confidentiality of this discharge information lies with you and/or your care-partner.

## 2018-02-14 NOTE — Progress Notes (Signed)
Called to room to assist during endoscopic procedure.  Patient ID and intended procedure confirmed with present staff. Received instructions for my participation in the procedure from the performing physician.  

## 2018-02-14 NOTE — Progress Notes (Addendum)
Pt's states that they have had knee surgery in August since having the previsit.

## 2018-02-15 ENCOUNTER — Telehealth: Payer: Self-pay | Admitting: *Deleted

## 2018-02-15 NOTE — Telephone Encounter (Signed)
  Follow up Call-  Call back number 02/14/2018  Post procedure Call Back phone  # [phone]  Permission to leave phone message Yes  Some recent data might be hidden     Patient questions:  Do you have a fever, pain , or abdominal swelling? No. Pain Score  0 *  Have you tolerated food without any problems? Yes.    Have you been able to return to your normal activities? Yes.    Do you have any questions about your discharge instructions: Diet   No. Medications  No. Follow up visit  No.  Do you have questions or concerns about your Care? No.  Actions: * If pain score is 4 or above: No action needed, pain <4.

## 2018-02-27 ENCOUNTER — Encounter: Payer: Self-pay | Admitting: Gastroenterology

## 2018-08-14 ENCOUNTER — Telehealth: Payer: Self-pay | Admitting: Student in an Organized Health Care Education/Training Program

## 2018-08-14 DIAGNOSIS — G894 Chronic pain syndrome: Secondary | ICD-10-CM

## 2018-08-14 NOTE — Telephone Encounter (Signed)
Attempted to call Lattie Haw, message left.

## 2018-08-14 NOTE — Addendum Note (Signed)
Addended by: Gillis Santa on: 08/14/2018 03:48 PM   Modules accepted: Orders

## 2018-08-14 NOTE — Telephone Encounter (Signed)
Lattie Haw from [employer] (case Freight forwarder for [employer]) called because she is concerned about Darin Gomez being without his meds. He was last prescribed MS 30 mg bid in Nov, 2019, given 3 months. He can no longer see this provider because his insurance no longer covers this provider. Darin Gomez is a new patient, with appt 08/28/18. She is asking if we can make an exception to our policy that we do not prescribe opioids at first visit. Can the UDS be done before the appt?

## 2018-08-14 NOTE — Telephone Encounter (Signed)
No, there is no guarantee that I will be taking pt over until we've completed full evaluation (possibly including psych eval). Please reach out to [facility] and let her know that I WILL not be prescribing on the first visit. I have ordered UDS though if patient would like to go ahead and get that done.  Orders Placed This Encounter  Procedures  . Compliance Drug Analysis, Ur    Volume: 30 ml(s). Minimum 3 ml of urine is needed. Document temperature of fresh sample. Indications: Long term (current) use of opiate analgesic (Z79.891) Test#: [id number] (Comprehensive Profile)

## 2018-08-14 NOTE — Telephone Encounter (Signed)
Lattie Haw with [employer] called and stated that she would like a nurse to return her call because she had some questions about pts appt. [phone]

## 2018-08-15 NOTE — Telephone Encounter (Signed)
Left message with Lattie Haw notifying her of this.

## 2018-08-28 ENCOUNTER — Other Ambulatory Visit: Payer: Self-pay

## 2018-08-28 ENCOUNTER — Ambulatory Visit
Payer: PRIVATE HEALTH INSURANCE | Attending: Student in an Organized Health Care Education/Training Program | Admitting: Student in an Organized Health Care Education/Training Program

## 2018-08-28 ENCOUNTER — Encounter: Payer: Self-pay | Admitting: Student in an Organized Health Care Education/Training Program

## 2018-08-28 VITALS — BP 151/70 | HR 69 | Temp 97.6°F | Resp 18 | Ht 72.0 in | Wt 295.0 lb

## 2018-08-28 DIAGNOSIS — G894 Chronic pain syndrome: Secondary | ICD-10-CM

## 2018-08-28 DIAGNOSIS — M5416 Radiculopathy, lumbar region: Secondary | ICD-10-CM | POA: Insufficient documentation

## 2018-08-28 DIAGNOSIS — Z9889 Other specified postprocedural states: Secondary | ICD-10-CM | POA: Diagnosis present

## 2018-08-28 DIAGNOSIS — M51369 Other intervertebral disc degeneration, lumbar region without mention of lumbar back pain or lower extremity pain: Secondary | ICD-10-CM

## 2018-08-28 DIAGNOSIS — M5136 Other intervertebral disc degeneration, lumbar region: Secondary | ICD-10-CM | POA: Insufficient documentation

## 2018-08-28 DIAGNOSIS — Z9689 Presence of other specified functional implants: Secondary | ICD-10-CM

## 2018-08-28 MED ORDER — PREGABALIN 75 MG PO CAPS: 75.0000 mg | ORAL_CAPSULE | Freq: Three times a day (TID) | ORAL | 2 refills | Status: DC

## 2018-08-28 NOTE — Progress Notes (Signed)
Patient's Name: Darin Gomez  MRN: [id number]  Referring Provider: Laureen Ochs*  DOB: Jan 27, 1951  PCP: Cyndi Bender, PA-C  DOS: 08/28/2018  Note by: Gillis Santa, MD  Service setting: Ambulatory outpatient  Specialty: Interventional Pain Management  Location: [facility] (AMB) Pain Management Facility  Visit type: Initial Patient Evaluation  Patient type: New Patient   Primary Reason(s) for Visit: Encounter for initial evaluation of one or more chronic problems (new to examiner) potentially causing chronic pain, and posing a threat to normal musculoskeletal function. (Level of risk: High) CC: Back Pain (low) and Leg Pain (posterior,anterior to feet bilaterally)  HPI  Darin Gomez is a 68 y.o. year old, male patient, who comes today to see [place] for the first time for an initial evaluation of his chronic pain. He has Chest discomfort; Chronic pain syndrome; History of lumbar laminectomy (2012, 2013); Spinal cord stimulator status (2015, [employer], [place]); Chronic lumbar radiculopathy; and Lumbar degenerative disc disease on their problem list. Today he comes in for evaluation of his Back Pain (low) and Leg Pain (posterior,anterior to feet bilaterally)  Pain Assessment: Location: Lower Back Radiating: legs  Onset: More than a month ago Duration: Chronic pain Quality: Sharp, Constant(corkscrew feeling) Severity: 4 /10 (subjective, self-reported pain score)  Note: Reported level is compatible with observation.                         When using our objective Pain Scale, levels between 6 and 10/10 are said to belong in an emergency room, as it progressively worsens from a 6/10, described as severely limiting, requiring emergency care not usually available at an outpatient pain management facility. At a 6/10 level, communication becomes difficult and requires great effort. Assistance to reach the emergency department may be required. Facial flushing and profuse sweating along with  potentially dangerous increases in heart rate and blood pressure will be evident. Effect on ADL:   Timing: Constant Modifying factors: Lyrica, positioning in bed,  BP: (!) 151/70  HR: 69  Onset and Duration: Date of onset: 1968- fall from a horse broke his tailbone Cause of pain: probably started with fractured tailbone at 68 yo Severity: Getting worse, NAS-11 at its worse: 7/10, NAS-11 at its best: 5/10, NAS-11 now: 5/10 and NAS-11 on the average: 5/10 Timing: Morning, Night, During activity or exercise and After activity or exercise Aggravating Factors: Bending, Bowel movements, Climbing, Kneeling, Lifiting, Motion, Prolonged sitting, Prolonged standing, Stooping , Surgery made it worse, Twisting, Walking, Walking uphill, Walking downhill and Working Alleviating Factors: Cold packs, Hot packs, Medications and SCS Associated Problems: Constipation, Day-time cramps, Night-time cramps, Dizziness, Erectile dysfunction, Fatigue, Impotence, Inability to concentrate, Inability to control bladder (urine), Inability to control bowel, Nausea, Numbness, Spasms, Sweating, Swelling, Temperature changes, Tingling, Weakness, Pain that wakes patient up and Pain that does not allow patient to sleep Quality of Pain: Burning, Constant, Intermittent, Cramping, Deep, Disabling, Dreadful, Dull, Exhausting, Getting longer, Hot, Itching, Pressure-like, Sharp, Shooting, Stabbing, Tender, Tingling, Tiring and Uncomfortable Previous Examinations or Tests: CT scan, Ct-Myelogram, MRI scan, Myelogram, Spinal tap, X-rays, Neurological evaluation and Psychiatric evaluation Previous Treatments: Narcotic medications, Pool exercises and Spinal cord stimulator  The patient comes into the clinics today for the first time for a chronic pain management evaluation.  Very pleasant 68 year old male who was previously seen at [facility].  Is requesting to have his care transferred to the [facility] given change in insurance status.  Of  note patient sustained a fall from a horse in 1968.  He believes that this started his back and leg problems.  Patient went on to have a lumbar laminectomy performed in 2012, followed by revision surgery in 2013.  Patient continued to have low back and leg pain even after surgery and Dr. Maryjean Ka placed [place] spinal cord stimulator in 2015.  Patient states that the spinal cord stimulator has positively impacted his pain.  He is able to perform ADLs with greater ease.  He finds benefit with spinal cord stimulation.  His current medications include pregabalin 75 mg 3 times daily, morphine extended release 30 mg twice daily.  He finds Lyrica more effective than generic pregabalin. Patient has tried increasing his Lyrica 250 mg 3 times a day but was unable to tolerate given side effects. He has been stable on his morphine dose of 30 mg twice daily.  Denies having any compliance issues   We do have detailed notes from Dr. Maryjean Ka clinic.    Historic Controlled Substance Pharmacotherapy Review  PMP and historical list of controlled substances: Morphine 30 mg twice daily quantity 60/month MME equals 60  MME/day: 60 mg/day Medications: The patient did not bring the medication(s) to the appointment, as requested in our "New Patient Package" Pharmacodynamics: Desired effects: Analgesia: The patient reports >50% benefit. Reported improvement in function: The patient reports medication allows him to accomplish basic ADLs. Clinically meaningful improvement in function (CMIF): Sustained CMIF goals met Perceived effectiveness: Described as relatively effective, allowing for increase in activities of daily living (ADL) Undesirable effects: Side-effects or Adverse reactions: None reported Historical Monitoring: The patient  reports no history of drug use. List of all UDS Test(s): No results found for: MDMA, COCAINSCRNUR, [place], [place], CANNABQUANT,  [place], [place] List of other Serum/Urine Drug Screening Test(s):  No results found for: AMPHSCRSER, BARBSCRSER, BENZOSCRSER, COCAINSCRSER, COCAINSCRNUR, PCPSCRSER, PCPQUANT, THCSCRSER, THCU, CANNABQUANT, OPIATESCRSER, OXYSCRSER, PROPOXSCRSER, ETH Historical Background Evaluation: [place] PMP: Six (6) year initial data search conducted.             [place] Department of public safety, offender search: Editor, commissioning Information) Non-contributory Risk Assessment Profile: Aberrant behavior: None observed or detected today Risk factors for fatal opioid overdose: None identified today Fatal overdose hazard ratio (HR): Calculation deferred Non-fatal overdose hazard ratio (HR): Calculation deferred Risk of opioid abuse or dependence: 0.7-3.0% with doses ? 36 MME/day and 6.1-26% with doses ? 120 MME/day. Substance use disorder (SUD) risk level: Pending results of Medical Psychology Evaluation for SUD Personal History of Substance Abuse (SUD-Substance use disorder):  Alcohol: Negative  Illegal Drugs: Negative  Rx Drugs: Negative  ORT Risk Level calculation: Low Risk Opioid Risk Tool - 08/28/18 1051      Family History of Substance Abuse   Alcohol  Negative    Illegal Drugs  Negative    Rx Drugs  Negative      Personal History of Substance Abuse   Alcohol  Negative    Illegal Drugs  Negative    Rx Drugs  Negative      Age   Age between 31-45 years   No      History of Preadolescent Sexual Abuse   History of Preadolescent Sexual Abuse  Negative or Male      Psychological Disease   Psychological Disease  Negative    Depression  Negative      Total Score   Opioid Risk Tool Scoring  0    Opioid Risk Interpretation  Low Risk  ORT Scoring interpretation table:  Score <3 = Low Risk for SUD  Score between 4-7 = Moderate Risk for SUD  Score >8 = High Risk for Opioid Abuse   PHQ-2 Depression Scale:  Total score: 0  PHQ-2 Scoring interpretation table: (Score and probability of major depressive disorder)   Score 0 = No depression  Score 1 = 15.4% Probability  Score 2 = 21.1% Probability  Score 3 = 38.4% Probability  Score 4 = 45.5% Probability  Score 5 = 56.4% Probability  Score 6 = 78.6% Probability   PHQ-9 Depression Scale:  Total score: 0  PHQ-9 Scoring interpretation table:  Score 0-4 = No depression  Score 5-9 = Mild depression  Score 10-14 = Moderate depression  Score 15-19 = Moderately severe depression  Score 20-27 = Severe depression (2.4 times higher risk of SUD and 2.89 times higher risk of overuse)   Pharmacologic Plan: As per protocol, I have not taken over any controlled substance management, pending the results of ordered tests and/or consults.            Initial impression: Pending review of available data and ordered tests.  Meds   Current Outpatient Medications:  .  aspirin EC 81 MG tablet, Take 81 mg by mouth at bedtime., Disp: , Rfl:  .  baclofen (LIORESAL) 20 MG tablet, Take 20 mg by mouth 3 (three) times daily., Disp: , Rfl:  .  cetirizine (ZYRTEC) 10 MG tablet, Take 10 mg by mouth daily. For allergies, Disp: , Rfl:  .  lisinopril-hydrochlorothiazide (PRINZIDE,ZESTORETIC) 10-12.5 MG per tablet, Take 1 tablet by mouth daily., Disp: , Rfl:  .  meclizine (ANTIVERT) 25 MG tablet, Take 25 mg by mouth daily as needed for dizziness. , Disp: , Rfl:  .  Melatonin 10 MG CAPS, Take 10 mg by mouth at bedtime., Disp: , Rfl:  .  morphine (MS CONTIN) 30 MG 12 hr tablet, Take 30 mg by mouth every 12 (twelve) hours., Disp: , Rfl:  .  Multiple Vitamins-Minerals (MULTIVITAMIN WITH MINERALS) tablet, Take 1 tablet by mouth daily. Spectravite, Disp: , Rfl:  .  nitroGLYCERIN (NITROSTAT) 0.4 MG SL tablet, Place 1 tablet (0.4 mg total) under the tongue every 5 (five) minutes as needed for chest pain., Disp: 25 tablet, Rfl: 3 .  pravastatin (PRAVACHOL) 80 MG tablet, Take 80 mg by mouth daily., Disp: , Rfl:  .  promethazine (PHENERGAN) 12.5 MG tablet, Take 2 tablets (25 mg total) by mouth  every 6 (six) hours as needed for nausea., Disp: 30 tablet, Rfl: 1 .  RELISTOR 150 MG TABS, Take 3 tablets by mouth every morning., Disp: , Rfl: 2 .  sildenafil (VIAGRA) 100 MG tablet, Take 100 mg by mouth daily as needed for erectile dysfunction., Disp: , Rfl:  .  testosterone (TESTIM) 50 MG/5GM (1%) GEL, Place 10 g onto the skin daily., Disp: , Rfl:  .  Vitamin D, Ergocalciferol, (DRISDOL) 50000 UNITS CAPS capsule, Take 50,000 Units by mouth 2 (two) times a week. , Disp: , Rfl:  .  bisacodyl (BISACODYL) 5 MG EC tablet, Take 5 mg by mouth daily as needed for moderate constipation., Disp: , Rfl:  .  docusate sodium (COLACE) 100 MG capsule, Take 300 mg by mouth at bedtime. , Disp: , Rfl:  .  nystatin cream (MYCOSTATIN), Apply 1 application topically daily. , Disp: , Rfl:  .  oxyCODONE (ROXICODONE) 15 MG immediate release tablet, Take 15 mg by mouth every 4 (four) hours as needed (breakthrough  pain). , Disp: , Rfl:  .  pregabalin (LYRICA) 75 MG capsule, Take 1 capsule (75 mg total) by mouth 3 (three) times daily., Disp: 90 capsule, Rfl: 2 .  senna-docusate (SENOKOT-S) 8.6-50 MG per tablet, Take 3 tablets by mouth See admin instructions. Take 3 tablets by mouth every other night, Disp: , Rfl:   Imaging Review  Cervical Imaging:  Results for orders placed during the hospital encounter of 05/03/16  CT Cervical Spine Wo Contrast   Narrative CLINICAL DATA:  Pt says he tripped on the doorstop Sat and landed on the Rt side of his face. Since Sat evening he's been experiencing an increasing headache, nausea and neck pain.  EXAM: CT HEAD WITHOUT CONTRAST  CT CERVICAL SPINE WITHOUT CONTRAST  TECHNIQUE: Multidetector CT imaging of the head and cervical spine was performed following the standard protocol without intravenous contrast. Multiplanar CT image reconstructions of the cervical spine were also generated.  COMPARISON:  None.  FINDINGS: CT HEAD FINDINGS  Brain: No evidence of acute  infarction, hemorrhage, hydrocephalus, extra-axial collection or mass lesion/mass effect.  Vascular: No hyperdense vessel or unexpected calcification.  Skull: Normal. Negative for fracture or focal lesion.  Sinuses/Orbits: Normal globes and orbits. Clear sinuses and mastoid air cells.  Other: None.  CT CERVICAL SPINE FINDINGS  Alignment: Normal.  Skull base and vertebrae: No acute fracture. No primary bone lesion or focal pathologic process.  Soft tissues and spinal canal: No prevertebral fluid or swelling. No visible canal hematoma.  Disc levels: Mild to moderate loss of disc height at C5-C6 and C6-C7. Endplate spurring and mild spondylotic disc bulging noted at these levels. Remaining disc spaces are well preserved. No convincing disc herniation.  Upper chest: Unremarkable.  Other: None  IMPRESSION: HEAD CT:  Normal.  No skull fracture.  CERVICAL CT:  No fracture or acute finding.   Electronically Signed   By: Lajean Manes M.D.   On: 05/03/2016 18:54     Results for orders placed during the hospital encounter of 05/03/16  CT Thoracic Spine Wo Contrast   Narrative CLINICAL DATA:  Thoracic spine pain after fall 2 days prior.  EXAM: CT THORACIC SPINE WITHOUT CONTRAST  TECHNIQUE: Multidetector CT images of the thoracic were obtained using the standard protocol without intravenous contrast.  COMPARISON:  Chest CT reformats 03/04/2014  FINDINGS: Alignment: Normal.  No listhesis or traumatic malalignment.  Vertebrae: No acute fracture or focal pathologic process. No compression fractures.  Paraspinal and other soft tissues: Spinal stimulator with tips posterior to T7-T8. No evidence of canal or paravertebral hematoma. Breathing motion artifact obscures lung evaluation, no gross focal abnormality.  Disc levels: Mild disc space narrowing and scattered Schmorl's nodes throughout. Anterior spurring from T4-T5 through the  thoracolumbar junction.  IMPRESSION: Degenerative change in the thoracic spine without acute fracture or subluxation.  Spinal stimulator in place.   Electronically Signed   By: Jeb Levering M.D.   On: 05/03/2016 21:06    Lumbar MR w/wo contrast:  Results for orders placed during the hospital encounter of 01/30/12  MR Lumbar Spine W Wo Contrast   Narrative *RADIOLOGY REPORT*  Clinical Data: Fecal incontinence.  Constant low back pain radiating down both lower extremities with spasms and numbness in the feet.  Lumbar spine surgery June 2012.  MRI LUMBAR SPINE WITHOUT AND WITH CONTRAST  Technique:  Multiplanar and multiecho pulse sequences of the lumbar spine were obtained without and with intravenous contrast.  Contrast: 57m MULTIHANCE GADOBENATE DIMEGLUMINE 529 MG/ML IV SOLN  BUN and creatinine were obtained on site at [facility] at [address] Results:  BUN 10 mg/dL,  Creatinine 1.0 mg/dL. GFR 76  Comparison: [facility] 10/15/2010 MRI.  Findings: Numbering convention used on prior exam preserved. Lumbosacral transitional anatomy.  Mild levoconvex lumbar scoliosis is present with the apex at L4.  The paraspinal soft tissues are within normal limits.  Spinal canal is congenitally narrow from L3- L5.  Spinal cord terminates posterior to the L1 vertebra.  This vertebra is only partially visualized but the superior endplate Schmorl's node appears similar.  L1-L2:  Sagittal imaging.  No stenosis or interval change.  L2-L3:  Negative.  L3-L4:  Moderate central stenosis is present.  This is multifactorial, associated with circumferential disc bulge, bilateral facet hypertrophy with right greater than left ligamentum flavum redundancy.  Congenitally short pedicles contribute. Bunching of the nerve roots is present associated with this stenosis.  Crowding both lateral recesses, right greater than left potentially affecting the descending L4  nerves.  Mild perineural enhancement is present around the exiting right L3 nerve, likely postoperative.  Moderate bilateral foraminal stenosis appears more pronounced on the prior MRI associated with more prominent disc bulging. The right-sided focal extrusion appears improved compared to the prior exam.  L4-L5:  Laminectomy.  Tiny fluid collection in the laminectomy bed measuring 5 mm x 7 mm, likely postoperative seroma.  Facet hypertrophy remains present.  Mild central stenosis.  Postoperative changes in the disc space with increased T2 signal and enhancement. Moderate bilateral foraminal stenosis appears little changed. Expected epidural enhancement is present.  Mild bilateral perineural enhancement, compatible with postoperative fibrosis.  L5-S1:  Disc desiccation.  Severe facet hypertrophy with anterior spurring bilaterally.  Mild central stenosis.  Shallow broad-based disc bulging.  Severe left foraminal stenosis with mild right foraminal stenosis.  There is narrowing of the right lateral recess potentially affecting the descending right S1 nerve.  Laminectomy. Left facet arthritis is present with post-gadolinium enhancement and edema in the left posterior elements on both sides of the L5-S1 facet joint.  This is new compared to prior.  L5-S1:  Rudimentary disc.  No stenosis.  IMPRESSION:  1.  L4-L5 postoperative changes with L4 laminectomy with postoperative enhancement of the disc space.  Bilateral foraminal stenosis. 2.  L5-S1 postoperative changes with L5 laminectomy.  Severe left foraminal stenosis potentially affecting the left L5 nerve. 3.  L3-L4 degenerative disc disease with progressive moderate central stenosis that is multifactorial.  Improved appearance of extruded disc in the right lateral recess although there is still bilateral lateral recess stenosis potentially affecting the descending L4 nerves.  Bilateral foraminal stenosis. 4.  Lumbosacral transitional  anatomy.  Numbering convention used on prior MRI preserved. 5.  New left L5-S1 facet arthritis with subchondral marrow edema and enhancement and periarticular inflammatory changes.   Original Report Authenticated By: Dereck Ligas, M.D.     Results for orders placed during the hospital encounter of 02/24/12  CT Lumbar Spine W Contrast   Narrative *RADIOLOGY REPORT*  Clinical Data:  Back pain  CT MYELOGRAPHY LUMBAR SPINE  Technique:  CT imaging of the lumbar spine was performed after intrathecal contrast administration.  Multiplanar CT image reconstructions were also generated.  Comparison:  01/30/2012  Findings: Radiologist injection.  Mild levoscoliosis at L4-5.  Anatomic alignment on the lateral view.  Transitional lumbosacral anatomy is noted.  S1 is transitional.  Conus medullaris terminates at mid L2.  L1-L2:  Minimal anterior osteophytes.  No spinal stenosis or impingement.  L2-L3:  Mild congenital narrowing.  Shallow left foraminal protrusion.  No central or foraminal stenosis.  L3-L4:  Congenital narrowing secondary to short pedicles. Ligamentum flavum heart and atrophy and mild concentric disc bulge result in moderate central stenosis.  There is prominent disc space narrowing with vacuum.  Minimal facet arthropathy.  Mild foraminal narrowing bilaterally.  L4-5:  Posterior decompression.  There is narrowing of both lateral recesses.  No evidence of central stenosis as suggested on upright imaging. Moderate foraminal narrowing bilaterally.  Mild left facet arthropathy.  L5-S1:  Normal disc height. Left extraforaminal protrusion and disc osteophytes. There is moderate to severe left foraminal narrowing. Moderate right foraminal narrowing.  Moderate left facet arthropathy.  S1-S2:  Rudimentary disc without stenosis or herniation.  There is severe narrowing of the anterior exit of the left S1, S2 foramen. This is secondary to overgrowth at the S1, S2 lateral  mass articulation.  This foramen is nearly obliterated.  See image 35 of series 400.  This could potentially effect the left S1 nerve root.  IMPRESSION: Mild congenital stenosis at L2-3.  Shallow left foraminal protrusion without compromise.  Moderate central stenosis at L3-4 with degenerative changes in the disc.  No significant central stenosis at L4-5.  There is bilateral lateral recess narrowing and moderate foraminal narrowing. Posterior decompression noted at this level.  Bilateral foraminal narrowing worse on the left at L5 S1.  Severe narrowing of the anterior left S1-S2 foramen with S1 nerve root encroachment. S1-S2 is a transitional level.   Original Report Authenticated By: Jamas Lav, M.D.    Lumbar DG 1V:  Results for orders placed during the hospital encounter of 03/15/12  DG Lumbar Spine 1 View   Narrative *RADIOLOGY REPORT*  Clinical Data: Laminectomy L3, L4, L5  LUMBAR SPINE - 1 VIEW  Comparison: CT myelogram 02/24/2012, MRI 01/30/2012  Findings: S1 is a transitional segment.  The lowest open disc space is S1- S2.  Normal alignment and no fracture.  Surgical instrument in the soft tissues posterior to the L4 pedicle.  Second instrument is present in the soft tissues posterior to the L5-S1 disc space.  IMPRESSION: Surgical localization as above.   Original Report Authenticated By: Truett Perna, M.D.    Results for orders placed during the hospital encounter of 02/24/12  DG Myelogram Lumbar   Narrative *RADIOLOGY REPORT*  Clinical Data:  Back pain and neurological dysfunction.  MYELOGRAM LUMBAR  Technique: The procedure, risks, benefits, and alternatives were explained to the patient. The patient understands and consents. Under fluoroscopic guidance, a 22 gauge spinal needle was placed in the CSF space via left S1-S2 approach. 16 mL of Omnipaque 180 was injected.  Findings: Lumbosacral transitional anatomy is again noted.  No  new vertebral body height loss.  Mild L1 compression deformity is chronic and stable.  Slight levoscoliosis at L3-4.  L1-L2:  Unremarkable other than the anterior osteophytes.  L2-L3:  Minimal ligamentum flavum buckling.  No overt stenosis  L3-L4:  Moderate circumferential spinal stenosis.  No evidence of instability.  Limited range of motion upon extension.  L4-L5:  Moderate circumferential spinal stenosis.  There is no evidence of instability.  Upon flexion, there is a large anterior epidural mass and severe spinal stenosis occurs.  L5 S1: Minimal anterior epidural mass effect.  S1, S2:  Rudimentary disc.  No stenosis.  Complications: .  Fluoroscopy Time: 1.0 minutes  IMPRESSION: Transitional anatomy is noted.  There is circumferential spinal stenosis at L3-4 and L4-5.  It is worse upon flexion and L4-5.   Original Report Authenticated By: Jamas Lav, M.D.     Results for orders placed during the hospital encounter of 10/30/10  DG Epidurography   Narrative *RADIOLOGY REPORT*  Clinical Data:  Disc herniation L3-4.  Spondylosis without myelopathy.  Hip and bilateral leg pain, left worse than right.  Procedure: The procedure, risks, benefits, and alternatives were explained to the patient. Questions regarding the procedure were encouraged and answered. The patient understands and consents to the procedure.  LUMBAR EPIDURAL INJECTION: An interlaminar approach was performed on the left at L4-5.  The overlying skin was cleansed with Betadine, draped in sterile fashion, and anesthetized using 1% Lidocaine.  A 20 gauge Crawford epidural needle was advanced using loss-of-resistance technique.  DIAGNOSTIC EPIDURAL INJECTION: Injection of Omnipaque 180 shows a good epidural pattern with spread above and below the level of needle placement, primarily on the left, but the both sides. No vascular opacification is seen.  Pattern of spread seems normal.  THERAPEUTIC  EPIDURAL INJECTION: 152m of Depo-Medrol mixed with 3 ml 1% lidocaine were instilled.  The procedure was well-tolerated, and the patient was discharged thirty minutes following the injection in good condition.  Fluoro time:  28 seconds  IMPRESSION: Technically successful initialepidural injection on the left at L4-5.  Original Report Authenticated By: MJules Schick M.D.   Results for orders placed in visit on 09/01/01  DG Hand Complete Left   Narrative FINDINGS CLINICAL DATA:  68YEAR-OLD FELL TWO VIEW LEFT FOREARM: THERE IS A COLLES TYPE FRACTURE INVOLVING THE DISTAL RADIUS.  NO RADIAL OR ULNAR SHAFT FRACTURES ARE SEEN. IMPRESSION COLLES TYPE FRACTURE WITHOUT ULNAR OR RADIAL SHAFT FRACTURE. FOUR VIEWS LEFT HAND: THERE IS A COMMINUTED DISPLACED COLLES TYPE FRACTURE OF THE DISTAL RADIUS.  NO ASSOCIATED ULNAR STYLOID FRACTURE.  CARPAL BONES ARE INTACT AND THE REMAINING BONY STRUCTURES APPEAR NORMAL. IMPRESSION COLLES TYPE FRACTURE OF THE LEFT WRIST.    Complexity Note: Imaging results reviewed. Results shared with Mr. PHackman using Layman's terms.                         ROS  Cardiovascular: Daily Aspirin intake, High blood pressure, Heart murmur and Heart catheterization Pulmonary or Respiratory: Snoring  Neurological: Abnormal skin sensations (Peripheral Neuropathy) and Incontinence:  Urinary Review of Past Neurological Studies:  Results for orders placed or performed during the hospital encounter of 05/03/16  CT Head Wo Contrast   Narrative   CLINICAL DATA:  Pt says he tripped on the doorstop Sat and landed on the Rt side of his face. Since Sat evening he's been experiencing an increasing headache, nausea and neck pain.  EXAM: CT HEAD WITHOUT CONTRAST  CT CERVICAL SPINE WITHOUT CONTRAST  TECHNIQUE: Multidetector CT imaging of the head and cervical spine was performed following the standard protocol without intravenous contrast. Multiplanar CT image reconstructions  of the cervical spine were also generated.  COMPARISON:  None.  FINDINGS: CT HEAD FINDINGS  Brain: No evidence of acute infarction, hemorrhage, hydrocephalus, extra-axial collection or mass lesion/mass effect.  Vascular: No hyperdense vessel or unexpected calcification.  Skull: Normal. Negative for fracture or focal lesion.  Sinuses/Orbits: Normal globes and orbits. Clear sinuses and mastoid air cells.  Other: None.  CT CERVICAL SPINE FINDINGS  Alignment: Normal.  Skull base and vertebrae: No acute fracture. No primary bone lesion or focal pathologic process.  Soft tissues and spinal canal: No prevertebral fluid or swelling. No  visible canal hematoma.  Disc levels: Mild to moderate loss of disc height at C5-C6 and C6-C7. Endplate spurring and mild spondylotic disc bulging noted at these levels. Remaining disc spaces are well preserved. No convincing disc herniation.  Upper chest: Unremarkable.  Other: None  IMPRESSION: HEAD CT:  Normal.  No skull fracture.  CERVICAL CT:  No fracture or acute finding.   Electronically Signed   By: Lajean Manes M.D.   On: 05/03/2016 18:54    Psychological-Psychiatric: Anxiousness, Depressed and Difficulty sleeping and or falling asleep Gastrointestinal: No reported gastrointestinal signs or symptoms such as vomiting or evacuating blood, reflux, heartburn, alternating episodes of diarrhea and constipation, inflamed or scarred liver, or pancreas or irrregular and/or infrequent bowel movements Genitourinary: No reported renal or genitourinary signs or symptoms such as difficulty voiding or producing urine, peeing blood, non-functioning kidney, kidney stones, difficulty emptying the bladder, difficulty controlling the flow of urine, or chronic kidney disease Hematological: No reported hematological signs or symptoms such as prolonged bleeding, low or poor functioning platelets, bruising or bleeding easily, hereditary bleeding problems,  low energy levels due to low hemoglobin or being anemic Endocrine: No reported endocrine signs or symptoms such as high or low blood sugar, rapid heart rate due to high thyroid levels, obesity or weight gain due to slow thyroid or thyroid disease Rheumatologic: No reported rheumatological signs and symptoms such as fatigue, joint pain, tenderness, swelling, redness, heat, stiffness, decreased range of motion, with or without associated rash Musculoskeletal: Negative for myasthenia gravis, muscular dystrophy, multiple sclerosis or malignant hyperthermia Work History: Working full time  Allergies  Darin Gomez is allergic to gabapentin; keppra [levetiracetam]; other; and valium [diazepam].  Laboratory Chemistry  Inflammation Markers (CRP: Acute Phase) (ESR: Chronic Phase) No results found for: CRP, ESRSEDRATE, LATICACIDVEN                       Rheumatology Markers No results found for: RF, ANA, LABURIC, URICUR, LYMEIGGIGMAB, LYMEABIGMQN, HLAB27                      Renal Function Markers Lab Results  Component Value Date   BUN 12 05/21/2014   CREATININE 0.92 05/21/2014   BCR 10 03/01/2014   GFRAA >90 05/21/2014   GFRNONAA 89 (L) 05/21/2014                             Hepatic Function Markers No results found for: AST, ALT, ALBUMIN, ALKPHOS, HCVAB, AMYLASE, LIPASE, AMMONIA                      Electrolytes Lab Results  Component Value Date   NA 137 05/21/2014   K 4.0 05/21/2014   CL 103 05/21/2014   CALCIUM 8.9 05/21/2014                        Neuropathy Markers No results found for: VITAMINB12, FOLATE, HGBA1C, HIV                      CNS Tests No results found for: COLORCSF, APPEARCSF, RBCCOUNTCSF, WBCCSF, POLYSCSF, LYMPHSCSF, EOSCSF, PROTEINCSF, GLUCCSF, JCVIRUS, CSFOLI, IGGCSF                      Bone Pathology Markers No results found for: [place], [id number], [id number], [id number], 25OHVITD1, 25OHVITD2, 25OHVITD3, TESTOFREE, TESTOSTERONE  Coagulation Parameters Lab Results  Component Value Date   INR 1.1 03/01/2014   LABPROT 11.0 03/01/2014   APTT 34 12/05/2010   PLT 225 05/21/2014                        Cardiovascular Markers Lab Results  Component Value Date   HGB 16.6 05/21/2014   HCT 48.8 05/21/2014                         CA Markers No results found for: CEA, CA125, LABCA2                      Endocrine Markers No results found for: TSH, FREET4, TESTOFREE, TESTOSTERONE, ESTRADIOL, ESTRADIOLPCT, ESTRADIOLFRE                      Note: Lab results reviewed.  [place]  Drug: Darin Gomez  reports no history of drug use. Alcohol:  reports current alcohol use. Tobacco:  reports that he has quit smoking. His smoking use included cigarettes. He quit after 45.00 years of use. He has quit using smokeless tobacco.  His smokeless tobacco use included chew. Medical:  has a past medical history of Allergy, Arthritis, Constipation, Environmental allergies, GERD (gastroesophageal reflux disease), Headache(784.0), Heart murmur, Hyperlipidemia, Hypertension, Insomnia, Neuropathy, PONV (postoperative nausea and vomiting), Shortness of breath dyspnea, Spinal headache, and Vertigo. Family: family history includes Heart attack in his mother; Heart attack (age of onset: 42) in his brother; Hyperlipidemia in his mother; Hypertension in his mother.  Past Surgical History:  Procedure Laterality Date  . BACK SURGERY    . CARDIAC CATHETERIZATION     03/04/14  . COLONOSCOPY    . FRACTURE SURGERY     left arm  . KNEE SURGERY Right 01/02/2018   torn minicus  . LEFT HEART CATHETERIZATION WITH CORONARY ANGIOGRAM N/A 03/04/2014   Procedure: LEFT HEART CATHETERIZATION WITH CORONARY ANGIOGRAM;  Surgeon: Peter M Martinique, MD;  Location[facility] CATH LAB;  Service: Cardiovascular;  Laterality: N/A;  . LUMBAR LAMINECTOMY/DECOMPRESSION MICRODISCECTOMY  03/15/2012   Procedure: LUMBAR LAMINECTOMY/DECOMPRESSION MICRODISCECTOMY 2 LEVELS;  Surgeon: Floyce Stakes, MD;  Location: [facility] NEURO ORS;  Service: Neurosurgery;  Laterality: Bilateral;  Bilateral Lumbar three, lumbar four, lumbar five Laminectomy  . POLYPECTOMY    . SPINAL CORD STIMULATOR INSERTION N/A 05/29/2014   Procedure: LUMBAR SPINAL CORD STIMULATOR INSERTION;  Surgeon: Bonna Gains, MD;  Location: [facility] NEURO ORS;  Service: Neurosurgery;  Laterality: N/A;   Active Ambulatory Problems    Diagnosis Date Noted  . Chest discomfort 03/01/2014  . Chronic pain syndrome 08/28/2018  . History of lumbar laminectomy (2012, 2013) 08/28/2018  . Spinal cord stimulator status (2015, [place]) 08/28/2018  . Chronic lumbar radiculopathy 08/28/2018  . Lumbar degenerative disc disease 08/28/2018   Resolved Ambulatory Problems    Diagnosis Date Noted  . No Resolved Ambulatory Problems   Past Medical History:  Diagnosis Date  . Allergy   . Arthritis   . Constipation   . Environmental allergies   . GERD (gastroesophageal reflux disease)   . Headache(784.0)   . Heart murmur   . Hyperlipidemia   . Hypertension   . Insomnia   . Neuropathy   . PONV (postoperative nausea and vomiting)   . Shortness of breath dyspnea   . Spinal headache   . Vertigo    Constitutional Exam  General appearance:  Well nourished, well developed, and well hydrated. In no apparent acute distress Vitals:   08/28/18 1034  BP: (!) 151/70  Pulse: 69  Resp: 18  Temp: 97.6 F (36.4 C)  TempSrc: Oral  SpO2: 94%  Weight: 295 lb (133.8 kg)  Height: 6' (1.829 m)   BMI Assessment: Estimated body mass index is 40.01 kg/m as calculated from the following:   Height as of this encounter: 6' (1.829 m).   Weight as of this encounter: 295 lb (133.8 kg).  BMI interpretation table: BMI level Category Range association with higher incidence of chronic pain  <18 kg/m2 Underweight   18.5-24.9 kg/m2 Ideal body weight   25-29.9 kg/m2 Overweight Increased incidence by 20%  30-34.9 kg/m2 Obese (Class I)  Increased incidence by 68%  35-39.9 kg/m2 Severe obesity (Class II) Increased incidence by 136%  >40 kg/m2 Extreme obesity (Class III) Increased incidence by 254%   Patient's current BMI Ideal Body weight  Body mass index is 40.01 kg/m. Ideal body weight: 77.6 kg (171 lb 1.2 oz) Adjusted ideal body weight: 100.1 kg (220 lb 10.3 oz)   BMI Readings from Last 4 Encounters:  08/28/18 40.01 kg/m  02/14/18 41.03 kg/m  11/24/17 41.11 kg/m  05/03/16 38.26 kg/m   Wt Readings from Last 4 Encounters:  08/28/18 295 lb (133.8 kg)  02/14/18 (!) 311 lb (141.1 kg)  11/24/17 (!) 311 lb 9.6 oz (141.3 kg)  05/03/16 290 lb (131.5 kg)  Psych/Mental status: Alert, oriented x 3 (person, place, & time)       Eyes: PERLA Respiratory: No evidence of acute respiratory distress  Cervical Spine Area Exam  Skin & Axial Inspection: No masses, redness, edema, swelling, or associated skin lesions Alignment: Symmetrical Functional ROM: Unrestricted ROM      Stability: No instability detected Muscle Tone/Strength: Functionally intact. No obvious neuro-muscular anomalies detected. Sensory (Neurological): Unimpaired Palpation: No palpable anomalies              Upper Extremity (UE) Exam    Side: Right upper extremity  Side: Left upper extremity  Skin & Extremity Inspection: Skin color, temperature, and hair growth are WNL. No peripheral edema or cyanosis. No masses, redness, swelling, asymmetry, or associated skin lesions. No contractures.  Skin & Extremity Inspection: Skin color, temperature, and hair growth are WNL. No peripheral edema or cyanosis. No masses, redness, swelling, asymmetry, or associated skin lesions. No contractures.  Functional ROM: Unrestricted ROM          Functional ROM: Unrestricted ROM          Muscle Tone/Strength: Functionally intact. No obvious neuro-muscular anomalies detected.  Muscle Tone/Strength: Functionally intact. No obvious neuro-muscular anomalies detected.  Sensory  (Neurological): Unimpaired          Sensory (Neurological): Unimpaired          Palpation: No palpable anomalies              Palpation: No palpable anomalies              Provocative Test(s):  Phalen's test: deferred Tinel's test: deferred Apley's scratch test (touch opposite shoulder):  Action 1 (Across chest): deferred Action 2 (Overhead): deferred Action 3 (LB reach): deferred   Provocative Test(s):  Phalen's test: deferred Tinel's test: deferred Apley's scratch test (touch opposite shoulder):  Action 1 (Across chest): deferred Action 2 (Overhead): deferred Action 3 (LB reach): deferred    Thoracic Spine Area Exam  Skin & Axial Inspection: Well healed scar from previous spine surgery detected  Alignment: Symmetrical Functional ROM: Decreased ROM Stability: No instability detected Muscle Tone/Strength: Functionally intact. No obvious neuro-muscular anomalies detected. Sensory (Neurological): Musculoskeletal pain pattern Muscle strength & Tone: No palpable anomalies  Lumbar Spine Area Exam  Skin & Axial Inspection: Well healed scar from previous spine surgery detected, allodynia overlying incision site, IPG palpated in right. Alignment: Asymmetric Functional ROM: Pain restricted ROM       Stability: No instability detected Muscle Tone/Strength: Functionally intact. No obvious neuro-muscular anomalies detected. Sensory (Neurological): Musculoskeletal pain pattern and dermatomal Palpation: Complains of area being tender to palpation       Provocative Tests: Hyperextension/rotation test: (+) due to pain. Lumbar quadrant test (Kemp's test): (+) due to pain. Lateral bending test: (+) due to pain.  FABER* test: deferred today                   S-I anterior distraction/compression test: deferred today         S-I lateral compression test: deferred today         S-I Thigh-thrust test: deferred today         S-I Gaenslen's test: deferred today         *(Flexion, ABduction and  External Rotation)  Gait & Posture Assessment  Ambulation: Limited Gait: Limited. Using assistive device to ambulate Posture: Difficulty standing up straight, due to pain   Lower Extremity Exam    Side: Right lower extremity  Side: Left lower extremity  Stability: No instability observed          Stability: No instability observed          Skin & Extremity Inspection: Skin color, temperature, and hair growth are WNL. No peripheral edema or cyanosis. No masses, redness, swelling, asymmetry, or associated skin lesions. No contractures.  Skin & Extremity Inspection: Skin color, temperature, and hair growth are WNL. No peripheral edema or cyanosis. No masses, redness, swelling, asymmetry, or associated skin lesions. No contractures.  Functional ROM: Pain restricted ROM for hip and knee joints          Functional ROM: Pain restricted ROM for hip and knee joints          Muscle Tone/Strength: Functionally intact. No obvious neuro-muscular anomalies detected.  Muscle Tone/Strength: Functionally intact. No obvious neuro-muscular anomalies detected.  Sensory (Neurological): Neuropathic pain pattern        Sensory (Neurological): Neuropathic pain pattern        DTR: Patellar: deferred today Achilles: deferred today Plantar: deferred today  DTR: Patellar: deferred today Achilles: deferred today Plantar: deferred today  Palpation: No palpable anomalies  Palpation: No palpable anomalies   Assessment  Primary Diagnosis & Pertinent Problem List: The primary encounter diagnosis was Chronic pain syndrome. Diagnoses of History of lumbar laminectomy (2012, 2013), Spinal cord stimulator status (2015, [place]), Chronic lumbar radiculopathy, and Lumbar degenerative disc disease were also pertinent to this visit.  Visit Diagnosis (New problems to examiner): 1. Chronic pain syndrome   2. History of lumbar laminectomy (2012, 2013)   3. Spinal cord stimulator status (2015, [place])   4. Chronic lumbar radiculopathy   5. Lumbar degenerative disc disease    Very pleasant 68 year old male who was previously seen at [facility].  Is requesting to have his care transferred to the [facility] given change in insurance status.  Of note patient sustained a fall from a horse in 1968.  He believes that this started his back and leg problems.  Patient went on to have a lumbar laminectomy performed in 2012, followed by revision surgery in 2013.  Patient continued to have low back and leg pain even after surgery and Dr. Maryjean Ka placed [place] spinal cord stimulator in 2015.  Patient states that the spinal cord stimulator has positively impacted his pain.  He is able to perform ADLs with greater ease.  He finds benefit with spinal cord stimulation.  His current medications include pregabalin 75 mg 3 times daily, morphine extended release 30 mg twice daily.  He finds Lyrica more effective than generic pregabalin. Patient has tried increasing his Lyrica 250 mg 3 times a day but was unable to tolerate given side effects. He has been stable on his morphine dose of 30 mg twice daily.  I reviewed the notes from Dr. Maryjean Ka clinic.  Overall, it seems that patient has been compliant with therapy, no red flags while in Dr. Maryjean Ka care.  I informed the patient of our clinic policies for new patients that are being established for chronic opioid therapy.  Will complete urine drug screen today which should only be positive for Lyrica, morphine and I will send the patient to psychiatry for SUD eval.  Based on clinical evaluation and ORT tool above, patient appears to be low risk however I appreciate evaluation from psychiatry.  So long as UDS and psych eval appropriate, will take patient over at his previous dose of morphine extended release 30 mg twice daily, quantity 60/month.  We will also place new prescription for Lyrica, non-generic at its current  dose of 75 mg 3 times daily since patient finds pregabalin not as effective as Lyrica.  Plan of Care (Initial workup plan)  Note: Please be advised that as per protocol, today's visit has been an evaluation only. We have not taken over the patient's controlled substance management.  Ordered Lab-work, Procedure(s), Referral(s), & Consult(s): Orders Placed This Encounter  Procedures  . ToxASSURE Select 13 (MW), Urine  . Ambulatory referral to Psychology   Pharmacotherapy (current): Medications ordered:  Meds ordered this encounter  Medications  . pregabalin (LYRICA) 75 MG capsule    Sig: Take 1 capsule (75 mg total) by mouth 3 (three) times daily.    Dispense:  90 capsule    Refill:  2    Do not place this medication, or any other prescription from our practice, on "Automatic Refill". Patient may have prescription filled one day early if pharmacy is closed on scheduled refill date.   Medications administered during this visit: [name] had no medications administered during this visit.   Pharmacological management options:  Opioid Analgesics: The patient was informed that there is no guarantee that he would be a candidate for opioid analgesics. The decision will be made following CDC guidelines. This decision will be based on the results of diagnostic studies, as well as Mr. Weltz's risk profile. Opioid dose: Morphine 30 mg BID  Membrane stabilizer: To be determined at a later time  Muscle relaxant: To be determined at a later time  NSAID: To be determined at a later time  Other analgesic(s): To be determined at a later time   Provider-requested follow-up: Return in about 4 weeks (around 09/25/2018) for Medication Management, After Psychological evaluation.  Future Appointments  Date Time Provider [place]  09/26/2018  9:30 AM Gillis Santa, MD [facility] None    Primary Care Physician: Cyndi Bender, PA-C Location: [facility] Note  by: Gillis Santa, M.D, Date: 08/28/2018; Time:  12:45 PM  There are no Patient Instructions on file for this visit.

## 2018-08-28 NOTE — Progress Notes (Signed)
Safety precautions to be maintained throughout the outpatient stay will include: orient to surroundings, keep bed in low position, maintain call bell within reach at all times, provide assistance with transfer out of bed and ambulation.  

## 2018-09-01 LAB — TOXASSURE SELECT 13 (MW), URINE

## 2018-09-08 ENCOUNTER — Telehealth: Payer: Self-pay | Admitting: *Deleted

## 2018-09-25 ENCOUNTER — Encounter: Payer: Self-pay | Admitting: Student in an Organized Health Care Education/Training Program

## 2018-09-25 NOTE — Progress Notes (Signed)
Has psychiatrist visit scheduled for May 13, telephonically  DR Pappen  Patient has SCS

## 2018-09-26 ENCOUNTER — Ambulatory Visit
Payer: PRIVATE HEALTH INSURANCE | Attending: Student in an Organized Health Care Education/Training Program | Admitting: Student in an Organized Health Care Education/Training Program

## 2018-09-26 ENCOUNTER — Other Ambulatory Visit: Payer: Self-pay

## 2018-09-26 VITALS — Ht 73.0 in | Wt 295.0 lb

## 2018-09-26 DIAGNOSIS — Z9689 Presence of other specified functional implants: Secondary | ICD-10-CM

## 2018-09-26 DIAGNOSIS — Z0289 Encounter for other administrative examinations: Secondary | ICD-10-CM

## 2018-09-26 DIAGNOSIS — M5416 Radiculopathy, lumbar region: Secondary | ICD-10-CM

## 2018-09-26 DIAGNOSIS — G894 Chronic pain syndrome: Secondary | ICD-10-CM | POA: Diagnosis not present

## 2018-09-26 DIAGNOSIS — Z9889 Other specified postprocedural states: Secondary | ICD-10-CM | POA: Diagnosis not present

## 2018-09-26 DIAGNOSIS — M5136 Other intervertebral disc degeneration, lumbar region: Secondary | ICD-10-CM

## 2018-09-26 MED ORDER — BACLOFEN 20 MG PO TABS: 20.0000 mg | ORAL_TABLET | Freq: Three times a day (TID) | ORAL | 4 refills | Status: DC

## 2018-09-26 MED ORDER — MORPHINE SULFATE ER 30 MG PO TBCR: 30.0000 mg | EXTENDED_RELEASE_TABLET | Freq: Two times a day (BID) | ORAL | 0 refills | Status: AC

## 2018-09-26 MED ORDER — MORPHINE SULFATE ER 30 MG PO TBCR: 30.0000 mg | EXTENDED_RELEASE_TABLET | Freq: Two times a day (BID) | ORAL | 0 refills | Status: DC

## 2018-09-26 MED ORDER — PREGABALIN 75 MG PO CAPS: 75.0000 mg | ORAL_CAPSULE | Freq: Three times a day (TID) | ORAL | 4 refills | Status: DC

## 2018-09-26 NOTE — Progress Notes (Signed)
Pain Management Virtual Encounter Note - Virtual Visit via [place] (real-time audio visits between healthcare provider and patient).  Patient's Phone No. & Preferred Pharmacy:  [phone] (home); [phone] (mobile); (Preferred) [phone] [email or web address]  [id number] [name], [place] - [address] [place] [address] Phone: [phone] Fax: [phone]   Pre-screening note:  Our staff contacted Darin Gomez and offered him an "in person", "face-to-face" appointment versus a telephone encounter. He indicated preferring the telephone encounter, at this time.  Reason for Virtual Visit: COVID-19*  Social distancing based on CDC and AMA recommendations.   I contacted Darin Gomez on 09/26/2018 at 10:51 AM via video conference and clearly identified myself as Gillis Santa, MD. I verified that I was speaking with the correct person using two identifiers (Name and date of birth: May 04, 1951).  Advanced Informed Consent I sought verbal advanced consent from Darin Gomez for virtual visit interactions. I informed Darin Gomez of possible security and privacy concerns, risks, and limitations associated with providing "not-in-person" medical evaluation and management services. I also informed Darin Gomez of the availability of "in-person" appointments. Finally, I informed him that there would be a charge for the virtual visit and that he could be  personally, fully or partially, financially responsible for it. Darin Gomez expressed understanding and agreed to proceed.   Historic Elements   Darin Gomez is a 68 y.o. year old, male patient evaluated today after his last encounter by our practice on 09/08/2018. Darin Gomez  has a past medical history of Allergy, Arthritis, Constipation, Environmental allergies, GERD (gastroesophageal reflux disease), Headache(784.0), Heart murmur, Hyperlipidemia, Hypertension,  Insomnia, Neuropathy, PONV (postoperative nausea and vomiting), Shortness of breath dyspnea, Spinal headache, and Vertigo. He also  has a past surgical history that includes Back surgery; Fracture surgery; Lumbar laminectomy/decompression microdiscectomy (03/15/2012); left heart catheterization with coronary angiogram (N/A, 03/04/2014); Cardiac catheterization; Spinal cord stimulator insertion (N/A, 05/29/2014); Colonoscopy; Polypectomy; and Knee surgery (Right, 01/02/2018). Darin Gomez has a current medication list which includes the following prescription(s): aspirin ec, baclofen, cetirizine, lisinopril-hydrochlorothiazide, meclizine, melatonin, morphine, multivitamin with minerals, nitroglycerin, nystatin cream, polyethylene glycol, pravastatin, promethazine, relistor, testosterone, vitamin d (ergocalciferol), bisacodyl, docusate sodium, morphine, oxycodone, pregabalin, senna-docusate, and sildenafil. He  reports that he has quit smoking. His smoking use included cigarettes. He quit after 45.00 years of use. He has quit using smokeless tobacco.  His smokeless tobacco use included chew. He reports current alcohol use. He reports that he does not use drugs. Darin Gomez is allergic to gabapentin; keppra [levetiracetam]; other; and valium [diazepam].   HPI  I last communicated with him on 08/28/2018. Today, he is being contacted for medication management.  UDS appropriate.  Has appointment with psychiatry next month.  Pharmacotherapy Assessment   08/31/2018  1   08/14/2018  Morphine Sulf Er 30 MG Tablet  60.00 30 Sa Des   [id number]   Nor ([id number])   0  60.00 MME  Comm Ins   [place]    Monitoring: Pharmacotherapy: No side-effects or adverse reactions reported.  PMP: PDMP reviewed during this encounter.          Compliance: No problems identified or detected. Plan: Refer to "POC".  Review of recent tests  CT Thoracic Spine Wo Contrast CLINICAL DATA:  Thoracic spine pain after fall 2 days prior.  EXAM: CT  THORACIC SPINE WITHOUT CONTRAST  TECHNIQUE: Multidetector CT images of the thoracic were obtained using the standard protocol without intravenous contrast.  COMPARISON:  Chest CT reformats 03/04/2014  FINDINGS: Alignment: Normal.  No listhesis or traumatic malalignment.  Vertebrae: No acute fracture or focal pathologic process. No compression fractures.  Paraspinal and other soft tissues: Spinal stimulator with tips posterior to T7-T8. No evidence of canal or paravertebral hematoma. Breathing motion artifact obscures lung evaluation, no gross focal abnormality.  Disc levels: Mild disc space narrowing and scattered Schmorl's nodes throughout. Anterior spurring from T4-T5 through the thoracolumbar junction.  IMPRESSION: Degenerative change in the thoracic spine without acute fracture or subluxation.  Spinal stimulator in place.  Electronically Signed   By: Jeb Levering M.D.   On: 05/03/2016 21:06 CT Cervical Spine Wo Contrast CLINICAL DATA:  Pt says he tripped on the doorstop Sat and landed on the Rt side of his face. Since Sat evening he's been experiencing an increasing headache, nausea and neck pain.  EXAM: CT HEAD WITHOUT CONTRAST  CT CERVICAL SPINE WITHOUT CONTRAST  TECHNIQUE: Multidetector CT imaging of the head and cervical spine was performed following the standard protocol without intravenous contrast. Multiplanar CT image reconstructions of the cervical spine were also generated.  COMPARISON:  None.  FINDINGS: CT HEAD FINDINGS  Brain: No evidence of acute infarction, hemorrhage, hydrocephalus, extra-axial collection or mass lesion/mass effect.  Vascular: No hyperdense vessel or unexpected calcification.  Skull: Normal. Negative for fracture or focal lesion.  Sinuses/Orbits: Normal globes and orbits. Clear sinuses and mastoid air cells.  Other: None.  CT CERVICAL SPINE FINDINGS  Alignment: Normal.  Skull base and vertebrae: No acute  fracture. No primary bone lesion or focal pathologic process.  Soft tissues and spinal canal: No prevertebral fluid or swelling. No visible canal hematoma.  Disc levels: Mild to moderate loss of disc height at C5-C6 and C6-C7. Endplate spurring and mild spondylotic disc bulging noted at these levels. Remaining disc spaces are well preserved. No convincing disc herniation.  Upper chest: Unremarkable.  Other: None  IMPRESSION: HEAD CT:  Normal.  No skull fracture.  CERVICAL CT:  No fracture or acute finding.  Electronically Signed   By: Lajean Manes M.D.   On: 05/03/2016 18:54 CT Head Wo Contrast CLINICAL DATA:  Pt says he tripped on the doorstop Sat and landed on the Rt side of his face. Since Sat evening he's been experiencing an increasing headache, nausea and neck pain.  EXAM: CT HEAD WITHOUT CONTRAST  CT CERVICAL SPINE WITHOUT CONTRAST  TECHNIQUE: Multidetector CT imaging of the head and cervical spine was performed following the standard protocol without intravenous contrast. Multiplanar CT image reconstructions of the cervical spine were also generated.  COMPARISON:  None.  FINDINGS: CT HEAD FINDINGS  Brain: No evidence of acute infarction, hemorrhage, hydrocephalus, extra-axial collection or mass lesion/mass effect.  Vascular: No hyperdense vessel or unexpected calcification.  Skull: Normal. Negative for fracture or focal lesion.  Sinuses/Orbits: Normal globes and orbits. Clear sinuses and mastoid air cells.  Other: None.  CT CERVICAL SPINE FINDINGS  Alignment: Normal.  Skull base and vertebrae: No acute fracture. No primary bone lesion or focal pathologic process.  Soft tissues and spinal canal: No prevertebral fluid or swelling. No visible canal hematoma.  Disc levels: Mild to moderate loss of disc height at C5-C6 and C6-C7. Endplate spurring and mild spondylotic disc bulging noted at these levels. Remaining disc spaces are well preserved.  No convincing disc herniation.  Upper chest: Unremarkable.  Other: None  IMPRESSION: HEAD CT:  Normal.  No skull fracture.  CERVICAL CT:  No fracture or acute  finding.  Electronically Signed   By: Lajean Manes M.D.   On: 05/03/2016 18:54   Office Visit on 08/28/2018  Component Date Value Ref Range Status  . Summary 08/28/2018 FINAL   Final   Comment: ==================================================================== TOXASSURE SELECT 13 (MW) ==================================================================== Test                             Result       Flag       Units Drug Present and Declared for Prescription Verification   Morphine                       >10870       EXPECTED   ng/mg creat   Normorphine                    321          EXPECTED   ng/mg creat    Potential sources of large amounts of morphine in the absence of    codeine include administration of morphine or use of heroin.    Normorphine is an expected metabolite of morphine.   Hydromorphone                  58           EXPECTED   ng/mg creat    Hydromorphone may be present as a metabolite of morphine;    concentrations of hydromorphone rarely exceed 5% of the morphine    concentration when this is the source of hydromorphone. Drug Absent but Declared for Prescription Verification   Diazepam                       Not Detected UNEXPECTED ng/mg crea                          t ==================================================================== Test                      Result    Flag   Units      Ref Range   Creatinine              92               mg/dL      >=20 ==================================================================== Declared Medications:  The flagging and interpretation on this report are based on the  following declared medications.  Unexpected results may arise from  inaccuracies in the declared medications.  **Note: The testing scope of this panel includes these medications:   Diazepam  Morphine (MS Contin)  **Note: The testing scope of this panel does not include following  reported medications:  Aspirin (Aspirin 81)  Baclofen  Cetirizine (Zyrtec)  Docusate (Colace)  Docusate (Dulcolax)  Gabapentin  Hydrochlorothiazide (Lisinopril-HCTZ)  Levetiracetam (Keppra)  Lisinopril (Lisinopril-HCTZ)  Meclizine  Melatonin  Methylnaltrexone (Relistor)  Multivitamin  Nitroglycerin  Nystatin  Vitamin D2 (Drisdol) =======                          ============================================================= For clinical consultation, please call [phone]. ====================================================================    Assessment  The primary encounter diagnosis was Chronic pain syndrome. Diagnoses of History of lumbar laminectomy (2012, 2013), Spinal cord stimulator status (2015, [employer], [place]), Chronic lumbar radiculopathy, Lumbar degenerative disc disease, and Pain management contract signed  were also pertinent to this visit.   1. Chronic pain syndrome   2. History of lumbar laminectomy (2012, 2013)   3. Spinal cord stimulator status (2015, [place])   4. Chronic lumbar radiculopathy   5. Lumbar degenerative disc disease   6. Pain management contract signed     Plan of Care  I have changed Darin Gomez baclofen and morphine. I am also having him start on morphine. Additionally, I am having him maintain his docusate sodium, Melatonin, cetirizine, oxyCODONE, promethazine, Vitamin D (Ergocalciferol), pravastatin, meclizine, lisinopril-hydrochlorothiazide, nitroGLYCERIN, multivitamin with minerals, testosterone, senna-docusate, nystatin cream, aspirin EC, bisacodyl, sildenafil, Relistor, polyethylene glycol, and pregabalin.  Pharmacotherapy (Medications Ordered): Meds ordered this encounter  Medications  . pregabalin (LYRICA) 75 MG capsule    Sig: Take 1 capsule (75 mg total) by mouth 3 (three) times daily.     Dispense:  90 capsule    Refill:  4    Do not place this medication, or any other prescription from our practice, on "Automatic Refill". Patient may have prescription filled one day early if pharmacy is closed on scheduled refill date.  . baclofen (LIORESAL) 20 MG tablet    Sig: Take 1 tablet (20 mg total) by mouth 3 (three) times daily.    Dispense:  30 each    Refill:  4  . morphine (MS CONTIN) 30 MG 12 hr tablet    Sig: Take 1 tablet (30 mg total) by mouth every 12 (twelve) hours for 30 days.    Dispense:  60 tablet    Refill:  0  . morphine (MS CONTIN) 30 MG 12 hr tablet    Sig: Take 1 tablet (30 mg total) by mouth every 12 (twelve) hours for 30 days. Must last 30 days.    Dispense:  60 tablet    Refill:  0    [place] STOP ACT - Not applicable. Fill one day early if pharmacy is closed on scheduled refill date.   Orders:  No orders of the defined types were placed in this encounter.  Follow-up plan:   Return in about 8 weeks (around 11/21/2018) for Medication Management.   I discussed the assessment and treatment plan with the patient. The patient was provided an opportunity to ask questions and all were answered. The patient agreed with the plan and demonstrated an understanding of the instructions.  Patient advised to call back or seek an in-person evaluation if the symptoms or condition worsens.  Total duration of non-face-to-face encounter: 25 minutes.  Note by: Gillis Santa, MD Date: 09/26/2018; Time: 10:51 AM  Disclaimer:  * Given the special circumstances of the COVID-19 pandemic, the federal government has announced that the Office for Civil Rights (OCR) will exercise its enforcement discretion and will not impose penalties on physicians using telehealth in the event of noncompliance with regulatory requirements under the [place] and Accountability Act (HIPAA) in connection with the good faith provision of telehealth during the IOEVO-35 national public health  emergency. ([place])

## 2018-10-11 ENCOUNTER — Ambulatory Visit (INDEPENDENT_AMBULATORY_CARE_PROVIDER_SITE_OTHER): Payer: No Typology Code available for payment source | Admitting: Psychiatry

## 2018-10-11 ENCOUNTER — Encounter: Payer: Self-pay | Admitting: Psychiatry

## 2018-10-11 ENCOUNTER — Other Ambulatory Visit: Payer: Self-pay

## 2018-10-11 DIAGNOSIS — G894 Chronic pain syndrome: Secondary | ICD-10-CM

## 2018-10-11 DIAGNOSIS — Z008 Encounter for other general examination: Secondary | ICD-10-CM

## 2018-10-11 NOTE — Progress Notes (Signed)
Virtual Visit via Video Note  I connected with Darin Gomez on 10/11/18 at  3:00 PM EDT by a video enabled telemedicine application and verified that I am speaking with the correct person using two identifiers.   I discussed the limitations of evaluation and management by telemedicine and the availability of in person appointments. The patient expressed understanding and agreed to proceed.   I discussed the assessment and treatment plan with the patient. The patient was provided an opportunity to ask questions and all were answered. The patient agreed with the plan and demonstrated an understanding of the instructions.   The patient was advised to call back or seek an in-person evaluation if the symptoms worsen or if the condition fails to improve as anticipated.    Psychiatric Initial Adult Assessment   Patient Identification: Darin Gomez MRN:  [id number] Date of Evaluation:  10/11/2018 Referral Source: Gillis Santa MD Chief Complaint:   Chief Complaint    Psychiatric Evaluation; Pain; Insomnia     Visit Diagnosis:    ICD-10-CM   1. Evaluation by psychiatric service required Z00.8   2. Chronic pain syndrome G89.4     History of Present Illness:  Darin Gomez is a 68 year old Caucasian male, married, currently unemployed, lives in [place], has a history of chronic pain, lumbar degenerative disc disease, hypertension, hyperlipidemia, vitamin D deficiency, was evaluated by telemedicine today.  Patient was referred to the clinic to evaluate for the risk of mental health/substance abuse potential prior to initiation of pain management by his pain provider.  Patient today appeared to be pleasant, alert, oriented to person ,place and situation.  Patient was able to answer all questions appropriately.  Patient denied any depressive symptoms.  Patient denies any anxiety symptoms.  Patient denied any history of trauma.  Patient denied any suicidality, homicidality or perceptual  disturbances.  Patient denied any history of substance abuse problems.  Patient was employed up until last week however recently lost his job due to [place].  He however has been managing okay.  He does have good support system from his wife who works as well as his children.  He is also planning to apply for unemployment benefits.  Patient reports he struggles with chronic pain.  It all started after back surgeries that he had previously.  He had a spinal cord implant done in 2015.  Patient reports he struggles with pain on a regular basis.  He used to follow-up with [facility] in [place] for pain management previously.  However his insurance plan changed and he had to transfer his care to Dr. Holley Raring.  Patient denies abusing or misusing his pain medications previously.  Patient reports he wants to manage his pain so that he can function during the day.  His pain also affects his sleep at night.  He currently takes melatonin which helps to some extent however he has a lot of difficulty falling asleep since he is constantly in pain.  Associated Signs/Symptoms: Depression Symptoms:  insomnia, fatigue, (Hypo) Manic Symptoms:  denies Anxiety Symptoms:  denies Psychotic Symptoms:  denies PTSD Symptoms: Negative  Past Psychiatric History: Patient denies inpatient mental health admissions.  Patient denies previous treatment for psychiatric problems.  Patient denies suicide attempts.  Previous Psychotropic Medications: No   Substance Abuse History in the last 12 months:  No.  Consequences of Substance Abuse: Negative  Past Medical History:  Past Medical History:  Diagnosis Date  . Allergy   . Arthritis  mainly in back  . Constipation   . Environmental allergies   . GERD (gastroesophageal reflux disease)   . Headache(784.0)    occas.  migraines  (from last surgery)  . Heart murmur    as child....Marland Kitchenno problems now  . Hyperlipidemia   . Hypertension    dx  10  yrs or more  ago  . Insomnia   . Neuropathy   . PONV (postoperative nausea and vomiting)   . Shortness of breath dyspnea    occasional  . Spinal headache   . Vertigo     Past Surgical History:  Procedure Laterality Date  . BACK SURGERY    . CARDIAC CATHETERIZATION     03/04/14  . COLONOSCOPY    . FRACTURE SURGERY     left arm  . KNEE SURGERY Right 01/02/2018   torn minicus  . LEFT HEART CATHETERIZATION WITH CORONARY ANGIOGRAM N/A 03/04/2014   Procedure: LEFT HEART CATHETERIZATION WITH CORONARY ANGIOGRAM;  Surgeon: Peter M Martinique, MD;  Location: [facility] CATH LAB;  Service: Cardiovascular;  Laterality: N/A;  . LUMBAR LAMINECTOMY/DECOMPRESSION MICRODISCECTOMY  03/15/2012   Procedure: LUMBAR LAMINECTOMY/DECOMPRESSION MICRODISCECTOMY 2 LEVELS;  Surgeon: Floyce Stakes, MD;  Location: [facility] OR[facility];  Service: Neurosurgery;  Laterality: Bilateral;  Bilateral Lumbar three, lumbar four, lumbar five Laminectomy  . POLYPECTOMY    . SPINAL CORD STIMULATOR INSERTION N/A 05/29/2014   Procedure: LUMBAR SPINAL CORD STIMULATOR INSERTION;  Surgeon: Bonna Gains, MD;  Location: [facility] NEURO ORS;  Service: Neurosurgery;  Laterality: N/A;    Family Psychiatric History: Patient denies family history of mental health problems.  Denies family history of substance abuse problems.  Family History:  Family History  Problem Relation Age of Onset  . Heart attack Mother   . Hypertension Mother   . Hyperlipidemia Mother   . Heart attack Brother 34  . Breast cancer Neg Hx   . Colon polyps Neg Hx   . Colon cancer Neg Hx   . Esophageal cancer Neg Hx   . Liver cancer Neg Hx   . Ovarian cancer Neg Hx   . Pancreatic cancer Neg Hx   . Prostate cancer Neg Hx   . Rectal cancer Neg Hx   . Stomach cancer Neg Hx     Social History:   Social History   Socioeconomic History  . Marital status: Married    Spouse name: Not on file  . Number of children: 3  . Years of education: Not on file  . Highest education  level: Some college, no degree  Occupational History  . Not on file  Social Needs  . Financial resource strain: Not very hard  . Food insecurity:    Worry: Sometimes true    Inability: Sometimes true  . Transportation needs:    Medical: No    Non-medical: No  Tobacco Use  . Smoking status: Former Smoker    Years: 45.00    Types: Cigarettes  . Smokeless tobacco: Former Systems developer    Types: Chew  . Tobacco comment: quit smoking cigarettes > 30 years ago  Substance and Sexual Activity  . Alcohol use: Yes    Alcohol/week: 5.0 - 9.0 standard drinks    Types: 3 - 4 Glasses of wine, 2 Shots of liquor per week    Comment: occasional beer  . Drug use: Never  . Sexual activity: Not Currently  Lifestyle  . Physical activity:    Days per week: 2 days    Minutes per session:  20 min  . Stress: To some extent  Relationships  . Social connections:    Talks on phone: Not on file    Gets together: Not on file    Attends religious service: 1 to 4 times per year    Active member of club or organization: No    Attends meetings of clubs or organizations: Never    Relationship status: Married  Other Topics Concern  . Not on file  Social History Narrative  . Not on file    Additional Social History: Patient reports he had a very good childhood.  He was raised by both parents.  He went up to high school and also did some college.  He got married very early.  He has 3 biological children and 2 stepchildren.  His children are all adults now.  They are all very supportive.  His wife continues to work.  He recently lost his job due to the COVID-19 outbreak.  He however is planning to apply for SSI benefits as well as unemployment benefits and is not planning to return to work.  Allergies:   Allergies  Allergen Reactions  . Gabapentin Swelling  . Keppra [Levetiracetam] Nausea And Vomiting  . Other Rash    Plastic tape: Paper tape ONLY!   . Valium [Diazepam] Other (See Comments)    Significant  depression; "a different person"    Metabolic Disorder Labs: No results found for: HGBA1C, MPG No results found for: PROLACTIN No results found for: CHOL, TRIG, HDL, CHOLHDL, VLDL, LDLCALC No results found for: TSH  Therapeutic Level Labs: No results found for: LITHIUM No results found for: CBMZ No results found for: VALPROATE  Current Medications: Current Outpatient Medications  Medication Sig Dispense Refill  . aspirin EC 81 MG tablet Take 81 mg by mouth at bedtime.    . baclofen (LIORESAL) 20 MG tablet Take 1 tablet (20 mg total) by mouth 3 (three) times daily. 30 each 4  . bisacodyl (BISACODYL) 5 MG EC tablet Take 5 mg by mouth daily as needed for moderate constipation.    . cetirizine (ZYRTEC) 10 MG tablet Take 10 mg by mouth daily. For allergies    . lisinopril-hydrochlorothiazide (PRINZIDE,ZESTORETIC) 10-12.5 MG per tablet Take 1 tablet by mouth daily.    . meclizine (ANTIVERT) 25 MG tablet Take 25 mg by mouth daily as needed for dizziness.     . Melatonin 10 MG CAPS Take 10 mg by mouth at bedtime.    . meloxicam (MOBIC) 15 MG tablet Take 15 mg by mouth daily.    Derrill Memo ON 10/28/2018] morphine (MS CONTIN) 30 MG 12 hr tablet Take 1 tablet (30 mg total) by mouth every 12 (twelve) hours for 30 days. Must last 30 days. 60 tablet 0  . Multiple Vitamins-Minerals (MULTIVITAMIN WITH MINERALS) tablet Take 1 tablet by mouth daily. Spectravite    . nitroGLYCERIN (NITROSTAT) 0.4 MG SL tablet Place 1 tablet (0.4 mg total) under the tongue every 5 (five) minutes as needed for chest pain. 25 tablet 3  . nystatin cream (MYCOSTATIN) Apply 1 application topically daily.     . pravastatin (PRAVACHOL) 80 MG tablet Take 80 mg by mouth daily.    . pregabalin (LYRICA) 75 MG capsule Take 1 capsule (75 mg total) by mouth 3 (three) times daily. 90 capsule 4  . promethazine (PHENERGAN) 12.5 MG tablet Take 2 tablets (25 mg total) by mouth every 6 (six) hours as needed for nausea. 30 tablet 1  . RELISTOR  150 MG TABS Take 3 tablets by mouth every morning.  2  . senna-docusate (SENOKOT-S) 8.6-50 MG per tablet Take 3 tablets by mouth See admin instructions. Take 3 tablets by mouth every other night    . sildenafil (VIAGRA) 100 MG tablet Take 100 mg by mouth daily as needed for erectile dysfunction.    Marland Kitchen testosterone (TESTIM) 50 MG/5GM (1%) GEL Place 10 g onto the skin daily.    . Vitamin D, Ergocalciferol, (DRISDOL) 50000 UNITS CAPS capsule Take 50,000 Units by mouth 2 (two) times a week.      No current facility-administered medications for this visit.     Musculoskeletal: Strength & Muscle Tone: UTA Gait & Station: UTA Patient leans: N/A  Psychiatric Specialty Exam: Review of Systems  Musculoskeletal: Positive for back pain.  Psychiatric/Behavioral: The patient has insomnia. The patient is not nervous/anxious.   All other systems reviewed and are negative.   There were no vitals taken for this visit.There is no height or weight on file to calculate BMI.  General Appearance: Casual  Eye Contact:  Fair  Speech:  Clear and Coherent  Volume:  Normal  Mood:  Euthymic  Affect:  Congruent  Thought Process:  Goal Directed and Descriptions of Associations: Intact  Orientation:  Full (Time, Place, and Person)  Thought Content:  Logical  Suicidal Thoughts:  No  Homicidal Thoughts:  No  Memory:  Immediate;   Fair Recent;   Fair Remote;   Fair  Judgement:  Fair  Insight:  Fair  Psychomotor Activity:  Normal  Concentration:  Concentration: Fair and Attention Span: Fair  Recall:  [place]: Fair  Akathisia:  No  Handed: NA  AIMS (if indicated): NA  Assets:  Communication Skills Desire for Improvement Financial Resources/Insurance [place] Talents/Skills Transportation Vocational/Educational  ADL's:  Intact  Cognition: WNL  Sleep:  Restless due to pain   Screenings: PHQ2-9     Office Visit from 08/28/2018 in  [facility]  PHQ-2 Total Score  0      Assessment and Plan: Rainier is a 68 year old Caucasian male, currently unemployed, married, lives in [place], has a history of chronic pain, hypertension, hyperlipidemia, vitamin D deficiency was evaluated by telemedicine today.  Patient was referred to the clinic to evaluate for the risk of mental health/substance abuse potential prior to the initiation of pain management. Patient does have sleep problems due to his pain.  Patient otherwise does not have any current mental health problems that needs treatment.    The following instruments were used; Instruments used Clinical interview Screener and opioid assessment for patient with pain/revised Opioid risk tool Drug abuse screening test Alcohol use disorder identification test PHQ 9 GAD 7  Based on clinical interview and instrument used at the time of evaluation the risk for mental health/substance abuse risk potential is low.  Patient does have sleep problems which is due to his pain.  Discussed with patient to continue melatonin.  After his pain is managed by his pain provider if he continues to have sleep problems discussed with patient to talk to his primary medical doctor or return to clinic for further treatment at that time.  I have spent atleast 60 minutes non face to face with patient today. More than 50 % of the time was spent for psychoeducation and supportive psychotherapy and care coordination.  This note was generated in part or whole with voice recognition software. Voice  recognition is usually quite accurate but there are transcription errors that can and very often do occur. I apologize for any typographical errors that were not detected and corrected.        Ursula Alert, MD 5/13/20204:45 PM

## 2018-11-02 ENCOUNTER — Telehealth: Payer: Self-pay | Admitting: *Deleted

## 2018-11-03 NOTE — Telephone Encounter (Signed)
Done

## 2018-11-13 ENCOUNTER — Other Ambulatory Visit: Payer: Self-pay | Admitting: Student in an Organized Health Care Education/Training Program

## 2018-11-29 ENCOUNTER — Encounter: Payer: Self-pay | Admitting: Student in an Organized Health Care Education/Training Program

## 2018-11-30 ENCOUNTER — Telehealth: Payer: Self-pay | Admitting: *Deleted

## 2018-11-30 ENCOUNTER — Ambulatory Visit
Payer: PRIVATE HEALTH INSURANCE | Attending: Student in an Organized Health Care Education/Training Program | Admitting: Student in an Organized Health Care Education/Training Program

## 2018-11-30 ENCOUNTER — Encounter: Payer: Self-pay | Admitting: Student in an Organized Health Care Education/Training Program

## 2018-11-30 ENCOUNTER — Telehealth: Payer: Self-pay | Admitting: Student in an Organized Health Care Education/Training Program

## 2018-11-30 ENCOUNTER — Other Ambulatory Visit: Payer: Self-pay

## 2018-11-30 DIAGNOSIS — Z9889 Other specified postprocedural states: Secondary | ICD-10-CM | POA: Diagnosis not present

## 2018-11-30 DIAGNOSIS — Z9689 Presence of other specified functional implants: Secondary | ICD-10-CM

## 2018-11-30 DIAGNOSIS — M5416 Radiculopathy, lumbar region: Secondary | ICD-10-CM | POA: Diagnosis not present

## 2018-11-30 DIAGNOSIS — M5136 Other intervertebral disc degeneration, lumbar region: Secondary | ICD-10-CM

## 2018-11-30 DIAGNOSIS — G894 Chronic pain syndrome: Secondary | ICD-10-CM

## 2018-11-30 MED ORDER — PREGABALIN 75 MG PO CAPS: 75.0000 mg | ORAL_CAPSULE | Freq: Three times a day (TID) | ORAL | 4 refills | Status: DC

## 2018-11-30 MED ORDER — BACLOFEN 20 MG PO TABS: 20.0000 mg | ORAL_TABLET | Freq: Three times a day (TID) | ORAL | 4 refills | Status: DC

## 2018-11-30 MED ORDER — MORPHINE SULFATE ER 30 MG PO T12A: 30.0000 mg | EXTENDED_RELEASE_TABLET | Freq: Two times a day (BID) | ORAL | 0 refills | Status: AC

## 2018-11-30 NOTE — Telephone Encounter (Signed)
Spoke with Darin Gomez, new Rx has been sent with correct fill date for today, 11/30/18.  Patient is going to pick up and we will touch base before end of day to confirm there is no problem getting Rx.

## 2018-11-30 NOTE — Telephone Encounter (Signed)
Patient states he called pharmacy to fill script for ms contin and was told he could not get it until 12-27-18. He states he should have a script sent in to fill today. Please verify and let patient know what is needed

## 2018-11-30 NOTE — Telephone Encounter (Signed)
Spoke with patient and he states that insurance wants to only give him 7 days but he has Good Rx card and they will fill the enitre Rx qty 7 for $21 and that is what he is going to do since it is a holiday weekend and we will deal with what needs to be done for the next fill.  He has received a text from [facility] that Rx is ready.

## 2018-11-30 NOTE — Progress Notes (Signed)
Pain Management Virtual Encounter Note - Virtual Visit via Telephone Telehealth (real-time audio visits between healthcare provider and patient).   Patient's Phone No. & Preferred Pharmacy:  [phone] (home); [phone] (mobile); (Preferred) [phone] [email or web address]  [id number] [name], [place] - [place] [address] [place] [address] Phone: [phone] Fax: [phone]    Pre-screening note:  Our staff contacted Mr. Consuegra and offered him an "in person", "face-to-face" appointment versus a telephone encounter. He indicated preferring the telephone encounter, at this time.   Reason for Virtual Visit: COVID-19*  Social distancing based on CDC and AMA recommendations.   I contacted [name] on 11/30/2018 via telephone.      I clearly identified myself as Gillis Santa, MD. I verified that I was speaking with the correct person using two identifiers (Name: DAEQUAN KOZMA, and date of birth: Aug 02, 1950).  Advanced Informed Consent I sought verbal advanced consent from [name] for virtual visit interactions. I informed Mr. Jolley of possible security and privacy concerns, risks, and limitations associated with providing "not-in-person" medical evaluation and management services. I also informed Mr. Willner of the availability of "in-person" appointments. Finally, I informed him that there would be a charge for the virtual visit and that he could be  personally, fully or partially, financially responsible for it. Mr. Clardy expressed understanding and agreed to proceed.   Historic Elements   Mr. CAULIN BEGLEY is a 68 y.o. year old, male patient evaluated today after his last encounter by our practice on 11/13/2018. Mr. Woodbury  has a past medical history of Allergy, Arthritis, Constipation, Environmental allergies, GERD (gastroesophageal reflux disease), Headache(784.0), Heart murmur, Hyperlipidemia, Hypertension,  Insomnia, Neuropathy, PONV (postoperative nausea and vomiting), Shortness of breath dyspnea, Spinal headache, and Vertigo. He also  has a past surgical history that includes Back surgery; Fracture surgery; Lumbar laminectomy/decompression microdiscectomy (03/15/2012); left heart catheterization with coronary angiogram (N/A, 03/04/2014); Cardiac catheterization; Spinal cord stimulator insertion (N/A, 05/29/2014); Colonoscopy; Polypectomy; and Knee surgery (Right, 01/02/2018). Mr. Hoselton has a current medication list which includes the following prescription(s): aspirin ec, baclofen, bisacodyl, cetirizine, lisinopril-hydrochlorothiazide, meclizine, melatonin, meloxicam, multivitamin with minerals, nitroglycerin, nystatin cream, pravastatin, pregabalin, promethazine, relistor, senna-docusate, sildenafil, testosterone, vitamin d (ergocalciferol), morphine sulfate er, and morphine sulfate er. He  reports that he has quit smoking. His smoking use included cigarettes. He quit after 45.00 years of use. He has quit using smokeless tobacco.  His smokeless tobacco use included chew. He reports current alcohol use of about 5.0 - 9.0 standard drinks of alcohol per week. He reports that he does not use drugs. Mr. Proch is allergic to gabapentin; keppra [levetiracetam]; other; and valium [diazepam].   HPI  Today, he is being contacted for medication management.  No change in medical history since last visit.  Patient's pain is at baseline.  Patient continues multimodal pain regimen as prescribed.  States that it provides pain relief and improvement in functional status.   Pharmacotherapy Assessment  Analgesic:   Morphine Sulf Er 30 MG Tablet (BID) 60.00 (MME)       Monitoring: Pharmacotherapy: No side-effects or adverse reactions reported. [place] PMP: PDMP reviewed during this encounter.       Compliance: No problems identified. Effectiveness: Clinically acceptable. Plan: Refer to "POC".  Pertinent Labs   SAFETY  SCREENING Profile Lab Results  Component Value Date   STAPHAUREUS NEGATIVE 05/21/2014   MRSAPCR NEGATIVE 05/21/2014   Renal Function Lab Results  Component Value Date  BUN 12 05/21/2014   CREATININE 0.92 05/21/2014   BCR 10 03/01/2014   GFRAA >90 05/21/2014   GFRNONAA 89 (L) 05/21/2014   Hepatic Function No results found for: AST, ALT, ALBUMIN UDS Summary  Date Value Ref Range Status  08/28/2018 FINAL  Final    Comment:    ==================================================================== TOXASSURE SELECT 13 (MW) ==================================================================== Test                             Result       Flag       Units Drug Present and Declared for Prescription Verification   Morphine                       >10870       EXPECTED   ng/mg creat   Normorphine                    321          EXPECTED   ng/mg creat    Potential sources of large amounts of morphine in the absence of    codeine include administration of morphine or use of heroin.    Normorphine is an expected metabolite of morphine.   Hydromorphone                  58           EXPECTED   ng/mg creat    Hydromorphone may be present as a metabolite of morphine;    concentrations of hydromorphone rarely exceed 5% of the morphine    concentration when this is the source of hydromorphone. Drug Absent but Declared for Prescription Verification   Diazepam                       Not Detected UNEXPECTED ng/mg creat ==================================================================== Test                      Result    Flag   Units      Ref Range   Creatinine              92               mg/dL      >=20 ==================================================================== Declared Medications:  The flagging and interpretation on this report are based on the  following declared medications.  Unexpected results may arise from  inaccuracies in the declared medications.  **Note: The testing scope of  this panel includes these medications:  Diazepam  Morphine (MS Contin)  **Note: The testing scope of this panel does not include following  reported medications:  Aspirin (Aspirin 81)  Baclofen  Cetirizine (Zyrtec)  Docusate (Colace)  Docusate (Dulcolax)  Gabapentin  Hydrochlorothiazide (Lisinopril-HCTZ)  Levetiracetam (Keppra)  Lisinopril (Lisinopril-HCTZ)  Meclizine  Melatonin  Methylnaltrexone (Relistor)  Multivitamin  Nitroglycerin  Nystatin  Vitamin D2 (Drisdol) ==================================================================== For clinical consultation, please call [phone]. ====================================================================    Note: Above Lab results reviewed.  Recent imaging  CT Thoracic Spine Wo Contrast CLINICAL DATA:  Thoracic spine pain after fall 2 days prior.  EXAM: CT THORACIC SPINE WITHOUT CONTRAST  TECHNIQUE: Multidetector CT images of the thoracic were obtained using the standard protocol without intravenous contrast.  COMPARISON:  Chest CT reformats 03/04/2014  FINDINGS: Alignment: Normal.  No listhesis or traumatic malalignment.  Vertebrae: No acute fracture or focal pathologic process.  No compression fractures.  Paraspinal and other soft tissues: Spinal stimulator with tips posterior to T7-T8. No evidence of canal or paravertebral hematoma. Breathing motion artifact obscures lung evaluation, no gross focal abnormality.  Disc levels: Mild disc space narrowing and scattered Schmorl's nodes throughout. Anterior spurring from T4-T5 through the thoracolumbar junction.  IMPRESSION: Degenerative change in the thoracic spine without acute fracture or subluxation.  Spinal stimulator in place.  Electronically Signed   By: Jeb Levering M.D.   On: 05/03/2016 21:06 CT Cervical Spine Wo Contrast CLINICAL DATA:  Pt says he tripped on the doorstop Sat and landed on the Rt side of his face. Since Sat evening he's  been experiencing an increasing headache, nausea and neck pain.  EXAM: CT HEAD WITHOUT CONTRAST  CT CERVICAL SPINE WITHOUT CONTRAST  TECHNIQUE: Multidetector CT imaging of the head and cervical spine was performed following the standard protocol without intravenous contrast. Multiplanar CT image reconstructions of the cervical spine were also generated.  COMPARISON:  None.  FINDINGS: CT HEAD FINDINGS  Brain: No evidence of acute infarction, hemorrhage, hydrocephalus, extra-axial collection or mass lesion/mass effect.  Vascular: No hyperdense vessel or unexpected calcification.  Skull: Normal. Negative for fracture or focal lesion.  Sinuses/Orbits: Normal globes and orbits. Clear sinuses and mastoid air cells.  Other: None.  CT CERVICAL SPINE FINDINGS  Alignment: Normal.  Skull base and vertebrae: No acute fracture. No primary bone lesion or focal pathologic process.  Soft tissues and spinal canal: No prevertebral fluid or swelling. No visible canal hematoma.  Disc levels: Mild to moderate loss of disc height at C5-C6 and C6-C7. Endplate spurring and mild spondylotic disc bulging noted at these levels. Remaining disc spaces are well preserved. No convincing disc herniation.  Upper chest: Unremarkable.  Other: None  IMPRESSION: HEAD CT:  Normal.  No skull fracture.  CERVICAL CT:  No fracture or acute finding.  Electronically Signed   By: Lajean Manes M.D.   On: 05/03/2016 18:54 CT Head Wo Contrast CLINICAL DATA:  Pt says he tripped on the doorstop Sat and landed on the Rt side of his face. Since Sat evening he's been experiencing an increasing headache, nausea and neck pain.  EXAM: CT HEAD WITHOUT CONTRAST  CT CERVICAL SPINE WITHOUT CONTRAST  TECHNIQUE: Multidetector CT imaging of the head and cervical spine was performed following the standard protocol without intravenous contrast. Multiplanar CT image reconstructions of the cervical spine were  also generated.  COMPARISON:  None.  FINDINGS: CT HEAD FINDINGS  Brain: No evidence of acute infarction, hemorrhage, hydrocephalus, extra-axial collection or mass lesion/mass effect.  Vascular: No hyperdense vessel or unexpected calcification.  Skull: Normal. Negative for fracture or focal lesion.  Sinuses/Orbits: Normal globes and orbits. Clear sinuses and mastoid air cells.  Other: None.  CT CERVICAL SPINE FINDINGS  Alignment: Normal.  Skull base and vertebrae: No acute fracture. No primary bone lesion or focal pathologic process.  Soft tissues and spinal canal: No prevertebral fluid or swelling. No visible canal hematoma.  Disc levels: Mild to moderate loss of disc height at C5-C6 and C6-C7. Endplate spurring and mild spondylotic disc bulging noted at these levels. Remaining disc spaces are well preserved. No convincing disc herniation.  Upper chest: Unremarkable.  Other: None  IMPRESSION: HEAD CT:  Normal.  No skull fracture.  CERVICAL CT:  No fracture or acute finding.  Electronically Signed   By: Lajean Manes M.D.   On: 05/03/2016 18:54  Assessment  The primary encounter diagnosis was Chronic  pain syndrome. Diagnoses of History of lumbar laminectomy (2012, 2013), Spinal cord stimulator status (2015, [place]), Chronic lumbar radiculopathy, and Lumbar degenerative disc disease were also pertinent to this visit.  Plan of Care  I am having [name] start on Morphine Sulfate ER and Morphine Sulfate ER. I am also having him maintain his Melatonin, cetirizine, promethazine, Vitamin D (Ergocalciferol), pravastatin, meclizine, lisinopril-hydrochlorothiazide, nitroGLYCERIN, multivitamin with minerals, testosterone, senna-docusate, nystatin cream, aspirin EC, bisacodyl, sildenafil, Relistor, meloxicam, baclofen, and pregabalin.  Pharmacotherapy (Medications Ordered): Meds ordered this encounter  Medications  . baclofen (LIORESAL) 20 MG  tablet    Sig: Take 1 tablet (20 mg total) by mouth 3 (three) times daily.    Dispense:  30 each    Refill:  4  . pregabalin (LYRICA) 75 MG capsule    Sig: Take 1 capsule (75 mg total) by mouth 3 (three) times daily.    Dispense:  90 capsule    Refill:  4    Do not place this medication, or any other prescription from our practice, on "Automatic Refill". Patient may have prescription filled one day early if pharmacy is closed on scheduled refill date.  . Morphine Sulfate ER 30 MG T12A    Sig: Take 30 mg by mouth every 12 (twelve) hours. Must last 30 days.    Dispense:  60 tablet    Refill:  0    [place] STOP ACT - Not applicable to Chronic Pain Syndrome (G89.4) diagnosis. Fill one day early if pharmacy is closed on scheduled refill date.  . Morphine Sulfate ER 30 MG T12A    Sig: Take 30 mg by mouth every 12 (twelve) hours. Must last 30 days.    Dispense:  60 tablet    Refill:  0    [place] STOP ACT - Not applicable to Chronic Pain Syndrome (G89.4) diagnosis. Fill one day early if pharmacy is closed on scheduled refill date.   Orders:  No orders of the defined types were placed in this encounter.  Follow-up plan:   Return in about 8 weeks (around 01/25/2019) for Medication Management.      Recent Visits Date Type Provider Dept  09/26/18 Office Visit Gillis Santa, MD [facility]  Showing recent visits within past 90 days and meeting all other requirements   Today's Visits Date Type Provider Dept  11/30/18 Office Visit Gillis Santa, MD [facility]  Showing today's visits and meeting all other requirements   Future Appointments No visits were found meeting these conditions.  Showing future appointments within next 90 days and meeting all other requirements   I discussed the assessment and treatment plan with the patient. The patient was provided an opportunity to ask questions and all were answered. The patient agreed with the plan and demonstrated an understanding of  the instructions.  Patient advised to call back or seek an in-person evaluation if the symptoms or condition worsens.  Total duration of non-face-to-face encounter: 55minutes.  Note by: Gillis Santa, MD Date: 11/30/2018; Time: 8:26 AM  Note: This dictation was prepared with Dragon dictation. Any transcriptional errors that may result from this process are unintentional.  Disclaimer:  * Given the special circumstances of the COVID-19 pandemic, the federal government has announced that the Office for Civil Rights (OCR) will exercise its enforcement discretion and will not impose penalties on physicians using telehealth in the event of noncompliance with regulatory requirements under the [place] and [place] (HIPAA) in connection with the  good faith provision of telehealth during the MVHQI-69 national public health emergency. (AMA)

## 2018-12-13 ENCOUNTER — Other Ambulatory Visit: Payer: Self-pay | Admitting: Student in an Organized Health Care Education/Training Program

## 2018-12-13 NOTE — Telephone Encounter (Signed)
Patient has switched pharmcy to [facility]. He needs Lyrica script sent to them. His next refill appt here is 01-23-19. He will be out of Lyrica on Sunday. Also please check his pain meds and send them to [facility] also

## 2018-12-14 ENCOUNTER — Telehealth: Payer: Self-pay | Admitting: Student in an Organized Health Care Education/Training Program

## 2018-12-14 NOTE — Telephone Encounter (Signed)
Patient states he got meds but only for 30 count not 90 which is what he will need as he is to take 1 tab 3x daily. Please call patient to verify

## 2018-12-18 MED ORDER — BACLOFEN 20 MG PO TABS: 20.0000 mg | ORAL_TABLET | Freq: Three times a day (TID) | ORAL | 4 refills | Status: DC

## 2018-12-18 NOTE — Telephone Encounter (Signed)
Patient notified per voicemail that prescription for Baclofen has been sent to pharmacy.

## 2018-12-18 NOTE — Telephone Encounter (Signed)
Referring to Baclofen. Patient is correct about this.

## 2019-01-15 ENCOUNTER — Other Ambulatory Visit: Payer: Self-pay | Admitting: Student in an Organized Health Care Education/Training Program

## 2019-01-17 ENCOUNTER — Telehealth: Payer: Self-pay | Admitting: Student in an Organized Health Care Education/Training Program

## 2019-01-17 NOTE — Telephone Encounter (Signed)
Patient lvmail stating he is out of Lyrica and needs this filled. His med appt is not until 8-25

## 2019-01-17 NOTE — Telephone Encounter (Signed)
We cant send in a RX, the only thing we can do is see if he can get in early

## 2019-01-18 NOTE — Telephone Encounter (Signed)
Spoke with patient about earlier appt. He is very dissatisfied with service he has received. Will keep his 8-25 appt. Has been out of Lyrica for a while.

## 2019-01-22 ENCOUNTER — Encounter: Payer: Self-pay | Admitting: Student in an Organized Health Care Education/Training Program

## 2019-01-22 NOTE — H&P (Signed)
TOTAL KNEE ADMISSION H&P  Patient is being admitted for right total knee arthroplasty.  Subjective:  Chief Complaint:right knee pain.  HPI: Darin Gomez, 68 y.o. male, has a history of pain and functional disability in the right knee due to arthritis and has failed non-surgical conservative treatments for greater than 12 weeks to includeNSAID's and/or analgesics, corticosteriod injections, use of assistive devices and activity modification.  Onset of symptoms was gradual, starting 2+ years ago with gradually worsening course since that time. The patient noted prior procedures on the knee to include  arthroscopy and menisectomy on the right knee.  Patient currently rates pain in the right knee(s) at 8 out of 10 with activity. Patient has night pain, worsening of pain with activity and weight bearing, pain that interferes with activities of daily living, pain with passive range of motion, crepitus and joint swelling.  Patient has evidence of periarticular osteophytes and joint space narrowing by imaging studies.  There is no active infection. Risks, benefits and expectations were discussed with the patient.  Risks including but not limited to the risk of anesthesia, blood clots, nerve damage, blood vessel damage, failure of the prosthesis, infection and up to and including death.  Patient understand the risks, benefits and expectations and wishes to proceed with surgery.   PCP: Cyndi Bender, PA-C  D/C Plans:       Home  Post-op Meds:       No Rx given  Tranexamic Acid:      To be given - IV   Decadron:      Is to be given  FYI:      ASA  Oxycodone  Maintain MS Cotin  DME:   Pt already has - RW. 3-n-1 rx sent to [place]  PT:   [place]    Patient Active Problem List   Diagnosis Date Noted  . Chronic pain syndrome 08/28/2018  . History of lumbar laminectomy (2012, 2013) 08/28/2018  . Spinal cord stimulator status (2015, [place]) 08/28/2018   . Chronic lumbar radiculopathy 08/28/2018  . Lumbar degenerative disc disease 08/28/2018  . Pain in right knee 12/05/2017  . Chest discomfort 03/01/2014   Past Medical History:  Diagnosis Date  . Allergy   . Arthritis    mainly in back  . Constipation   . Environmental allergies   . GERD (gastroesophageal reflux disease)   . Headache(784.0)    occas.  migraines  (from last surgery)  . Heart murmur    as child....Marland Kitchenno problems now  . Hyperlipidemia   . Hypertension    dx  10 yrs or more  ago  . Insomnia   . Neuropathy   . PONV (postoperative nausea and vomiting)   . Shortness of breath dyspnea    occasional  . Spinal headache   . Vertigo     Past Surgical History:  Procedure Laterality Date  . BACK SURGERY    . CARDIAC CATHETERIZATION     03/04/14  . COLONOSCOPY    . FRACTURE SURGERY     left arm  . KNEE SURGERY Right 01/02/2018   torn minicus  . LEFT HEART CATHETERIZATION WITH CORONARY ANGIOGRAM N/A 03/04/2014   Procedure: LEFT HEART CATHETERIZATION WITH CORONARY ANGIOGRAM;  Surgeon: Peter M Martinique, MD;  [facility] CATH LAB;  Service: Cardiovascular;  Laterality: N/A;  . LUMBAR LAMINECTOMY/DECOMPRESSION MICRODISCECTOMY  03/15/2012   Procedure: LUMBAR LAMINECTOMY/DECOMPRESSION MICRODISCECTOMY 2 LEVELS;  Surgeon: Floyce Stakes, MD;  Location:  [facility] NEURO ORS;  Service: Neurosurgery;  Laterality: Bilateral;  Bilateral Lumbar three, lumbar four, lumbar five Laminectomy  . POLYPECTOMY    . SPINAL CORD STIMULATOR INSERTION N/A 05/29/2014   Procedure: LUMBAR SPINAL CORD STIMULATOR INSERTION;  Surgeon: Bonna Gains, MD;  Location: [facility] NEURO ORS;  Service: Neurosurgery;  Laterality: N/A;    No current facility-administered medications for this encounter.    Current Outpatient Medications  Medication Sig Dispense Refill Last Dose  . aspirin EC 81 MG tablet Take 81 mg by mouth at bedtime.   Taking  . baclofen (LIORESAL) 20 MG tablet Take 1 tablet (20 mg total) by mouth 3  (three) times daily. 90 each 4   . bisacodyl (BISACODYL) 5 MG EC tablet Take 5 mg by mouth daily as needed for moderate constipation.   Taking  . cetirizine (ZYRTEC) 10 MG tablet Take 10 mg by mouth daily. For allergies   Taking  . lisinopril-hydrochlorothiazide (PRINZIDE,ZESTORETIC) 10-12.5 MG per tablet Take 1 tablet by mouth daily.   Taking  . meclizine (ANTIVERT) 25 MG tablet Take 25 mg by mouth daily as needed for dizziness.    Taking  . Melatonin 10 MG CAPS Take 10 mg by mouth at bedtime.   Taking  . meloxicam (MOBIC) 15 MG tablet Take 15 mg by mouth daily.   Taking  . Morphine Sulfate ER 30 MG T12A Take 30 mg by mouth every 12 (twelve) hours. Must last 30 days. 60 tablet 0   . Multiple Vitamins-Minerals (MULTIVITAMIN WITH MINERALS) tablet Take 1 tablet by mouth daily. Spectravite   Taking  . nitroGLYCERIN (NITROSTAT) 0.4 MG SL tablet Place 1 tablet (0.4 mg total) under the tongue every 5 (five) minutes as needed for chest pain. 25 tablet 3 Taking  . nystatin cream (MYCOSTATIN) Apply 1 application topically daily.    Taking  . pravastatin (PRAVACHOL) 80 MG tablet Take 80 mg by mouth daily.   Taking  . pregabalin (LYRICA) 75 MG capsule TAKE ONE CAPSULE BY MOUTH THREE TIMES DAILY  90 capsule 0   . promethazine (PHENERGAN) 12.5 MG tablet Take 2 tablets (25 mg total) by mouth every 6 (six) hours as needed for nausea. 30 tablet 1 Taking  . RELISTOR 150 MG TABS Take 3 tablets by mouth every morning.  2 Taking  . senna-docusate (SENOKOT-S) 8.6-50 MG per tablet Take 3 tablets by mouth See admin instructions. Take 3 tablets by mouth every other night   Taking  . sildenafil (VIAGRA) 100 MG tablet Take 100 mg by mouth daily as needed for erectile dysfunction.   Taking  . testosterone (TESTIM) 50 MG/5GM (1%) GEL Place 10 g onto the skin daily.   Taking  . Vitamin D, Ergocalciferol, (DRISDOL) 50000 UNITS CAPS capsule Take 50,000 Units by mouth 2 (two) times a week.    Taking   Allergies  Allergen  Reactions  . Gabapentin Swelling  . Keppra [Levetiracetam] Nausea And Vomiting  . Other Rash    Plastic tape: Paper tape ONLY!   . Valium [Diazepam] Other (See Comments)    Significant depression; "a different person"    Social History   Tobacco Use  . Smoking status: Former Smoker    Years: 45.00    Types: Cigarettes  . Smokeless tobacco: Former Systems developer    Types: Chew  . Tobacco comment: quit smoking cigarettes > 30 years ago  Substance Use Topics  . Alcohol use: Yes    Alcohol/week: 5.0 - 9.0 standard drinks  Types: 3 - 4 Glasses of wine, 2 Shots of liquor per week    Comment: occasional beer    Family History  Problem Relation Age of Onset  . Heart attack Mother   . Hypertension Mother   . Hyperlipidemia Mother   . Heart attack Brother 19  . Breast cancer Neg Hx   . Colon polyps Neg Hx   . Colon cancer Neg Hx   . Esophageal cancer Neg Hx   . Liver cancer Neg Hx   . Ovarian cancer Neg Hx   . Pancreatic cancer Neg Hx   . Prostate cancer Neg Hx   . Rectal cancer Neg Hx   . Stomach cancer Neg Hx      Review of Systems  Constitutional: Negative.   HENT: Negative.   Eyes: Negative.   Respiratory: Negative.   Cardiovascular: Negative.   Gastrointestinal: Positive for constipation.  Genitourinary: Negative.   Musculoskeletal: Positive for back pain and joint pain.  Skin: Negative.   Neurological: Negative.   Endo/Heme/Allergies: Negative.   Psychiatric/Behavioral: Negative.     Objective:  Physical Exam  Constitutional: He is oriented to person, place, and time. He appears well-developed.  HENT:  Head: Normocephalic.  Eyes: Pupils are equal, round, and reactive to light.  Neck: Neck supple. No JVD present. No tracheal deviation present. No thyromegaly present.  Cardiovascular: Normal rate, regular rhythm and intact distal pulses.  Respiratory: Effort normal and breath sounds normal. No respiratory distress. He has no wheezes.  GI: Soft. There is no  abdominal tenderness. There is no guarding.  Musculoskeletal:     Right knee: He exhibits decreased range of motion, swelling and bony tenderness. He exhibits no ecchymosis, no deformity, no laceration and no erythema. Tenderness found.  Lymphadenopathy:    He has no cervical adenopathy.  Neurological: He is alert and oriented to person, place, and time.  Skin: Skin is warm and dry.  Psychiatric: He has a normal mood and affect.      Labs:  Estimated body mass index is 38.92 kg/m as calculated from the following:   Height as of 09/25/18: 6\' 1"  (1.854 m).   Weight as of 09/25/18: 133.8 kg.   Imaging Review Plain radiographs demonstrate severe degenerative joint disease of the right knee.  The bone quality appears to be good for age and reported activity level.      Assessment/Plan:  End stage arthritis, right knee   The patient history, physical examination, clinical judgment of the provider and imaging studies are consistent with end stage degenerative joint disease of the right knee(s) and total knee arthroplasty is deemed medically necessary. The treatment options including medical management, injection therapy arthroscopy and arthroplasty were discussed at length. The risks and benefits of total knee arthroplasty were presented and reviewed. The risks due to aseptic loosening, infection, stiffness, patella tracking problems, thromboembolic complications and other imponderables were discussed. The patient acknowledged the explanation, agreed to proceed with the plan and consent was signed. Patient is being admitted for inpatient treatment for surgery, pain control, PT, OT, prophylactic antibiotics, VTE prophylaxis, progressive ambulation and ADL's and discharge planning. The patient is planning to be discharged home.   Anticipated LOS equal to or greater than 2 midnights due to - Age 85 and older with one or more of the following:  - Obesity  - Expected need for hospital services  (PT, OT, Nursing) required for safe  discharge  - Anticipated need for postoperative skilled nursing care or inpatient rehab  -  Active co-morbidities: Chronic pain requiring opiods    [place]. Kaisy Severino   PA-C  01/22/2019, 11:30 AM

## 2019-01-23 ENCOUNTER — Ambulatory Visit: Payer: PRIVATE HEALTH INSURANCE | Admitting: Student in an Organized Health Care Education/Training Program

## 2019-01-23 ENCOUNTER — Other Ambulatory Visit: Payer: Self-pay

## 2019-01-23 NOTE — Progress Notes (Deleted)
Pain Management Virtual Encounter Note - Virtual Visit via Telephone Telehealth (real-time audio visits between healthcare provider and patient).   Patient's Phone No. & Preferred Pharmacy:  [phone] (home); [phone] (mobile); (Preferred) [phone] [email or web address]  [id number] [name], [place] [address] [place] [address] Phone: [phone] Fax: [place], [place] [address] Phone: [phone] Fax: [phone]    Pre-screening note:  Our staff contacted Darin Gomez and offered him an "in person", "face-to-face" appointment versus a telephone encounter. He indicated preferring the telephone encounter, at this time.   Reason for Virtual Visit: COVID-19*  Social distancing based on CDC and AMA recommendations.   I contacted Darin Gomez on 01/23/2019 via telephone.      I clearly identified myself as Gillis Santa, MD. I verified that I was speaking with the correct person using two identifiers (Name: Darin Gomez, and date of birth: 10/26/1950).  Advanced Informed Consent I sought verbal advanced consent from Darin Gomez for virtual visit interactions. I informed Darin Gomez of possible security and privacy concerns, risks, and limitations associated with providing "not-in-person" medical evaluation and management services. I also informed Darin Gomez of the availability of "in-person" appointments. Finally, I informed him that there would be a charge for the virtual visit and that he could be  personally, fully or partially, financially responsible for it. Darin Gomez expressed understanding and agreed to proceed.   Historic Elements   Darin Gomez is a 69 y.o. year old, male patient evaluated today after his last encounter by our practice on 01/17/2019. Darin Gomez  has a past medical history of Allergy,  Arthritis, Constipation, Environmental allergies, GERD (gastroesophageal reflux disease), Headache(784.0), Heart murmur, Hyperlipidemia, Hypertension, Insomnia, Neuropathy, PONV (postoperative nausea and vomiting), Shortness of breath dyspnea, Spinal headache, and Vertigo. He also  has a past surgical history that includes Back surgery; Fracture surgery; Lumbar laminectomy/decompression microdiscectomy (03/15/2012); left heart catheterization with coronary angiogram (N/A, 03/04/2014); Cardiac catheterization; Spinal cord stimulator insertion (N/A, 05/29/2014); Colonoscopy; Polypectomy; and Knee surgery (Right, 01/02/2018). Darin Gomez has a current medication list which includes the following prescription(s): aspirin ec, baclofen, bisacodyl, cetirizine, lisinopril-hydrochlorothiazide, meclizine, melatonin, meloxicam, morphine sulfate er, multivitamin with minerals, nitroglycerin, nystatin cream, pravastatin, pregabalin, promethazine, relistor, senna-docusate, sildenafil, testosterone, and vitamin d (ergocalciferol). He  reports that he has quit smoking. His smoking use included cigarettes. He quit after 45.00 years of use. He has quit using smokeless tobacco.  His smokeless tobacco use included chew. He reports current alcohol use of about 5.0 - 9.0 standard drinks of alcohol per week. He reports that he does not use drugs. Darin Gomez is allergic to gabapentin; keppra [levetiracetam]; other; and valium [diazepam].   HPI  Today, he is being contacted for  ***   Pharmacotherapy Assessment  Analgesic:  Morphine Sulf Er 30 MG Tablet (BID) 60.00 (MME)       Monitoring: Pharmacotherapy: No side-effects or adverse reactions reported. [place] PMP: PDMP not reviewed this encounter.       Compliance: No problems identified. Effectiveness: Clinically acceptable. Plan: Refer to "POC".  UDS:  Summary  Date Value Ref Range Status  08/28/2018 FINAL  Final    Comment:     ==================================================================== TOXASSURE SELECT 13 (MW) ==================================================================== Test  Result       Flag       Units Drug Present and Declared for Prescription Verification   Morphine                       >10870       EXPECTED   ng/mg creat   Normorphine                    321          EXPECTED   ng/mg creat    Potential sources of large amounts of morphine in the absence of    codeine include administration of morphine or use of heroin.    Normorphine is an expected metabolite of morphine.   Hydromorphone                  58           EXPECTED   ng/mg creat    Hydromorphone may be present as a metabolite of morphine;    concentrations of hydromorphone rarely exceed 5% of the morphine    concentration when this is the source of hydromorphone. Drug Absent but Declared for Prescription Verification   Diazepam                       Not Detected UNEXPECTED ng/mg creat ==================================================================== Test                      Result    Flag   Units      Ref Range   Creatinine              92               mg/dL      >=20 ==================================================================== Declared Medications:  The flagging and interpretation on this report are based on the  following declared medications.  Unexpected results may arise from  inaccuracies in the declared medications.  **Note: The testing scope of this panel includes these medications:  Diazepam  Morphine (MS Contin)  **Note: The testing scope of this panel does not include following  reported medications:  Aspirin (Aspirin 81)  Baclofen  Cetirizine (Zyrtec)  Docusate (Colace)  Docusate (Dulcolax)  Gabapentin  Hydrochlorothiazide (Lisinopril-HCTZ)  Levetiracetam (Keppra)  Lisinopril (Lisinopril-HCTZ)  Meclizine  Melatonin  Methylnaltrexone (Relistor)  Multivitamin   Nitroglycerin  Nystatin  Vitamin D2 (Drisdol) ==================================================================== For clinical consultation, please call [phone]. ====================================================================    Laboratory Chemistry Profile (12 mo)  Renal: No results found for requested labs within last 8760 hours.  Lab Results  Component Value Date   GFRAA >90 05/21/2014   GFRNONAA 89 (L) 05/21/2014   Hepatic: No results found for requested labs within last 8760 hours. No results found for: AST, ALT Other: No results found for requested labs within last 8760 hours. Note: Above Lab results reviewed.  Imaging  Last 90 days:  No results found.  Assessment  There were no encounter diagnoses.  Plan of Care  I am having Darin Gomez maintain his Melatonin, cetirizine, promethazine, Vitamin D (Ergocalciferol), pravastatin, meclizine, lisinopril-hydrochlorothiazide, nitroGLYCERIN, multivitamin with minerals, testosterone, senna-docusate, nystatin cream, aspirin EC, bisacodyl, sildenafil, Relistor, meloxicam, Morphine Sulfate ER, pregabalin, and baclofen.  Pharmacotherapy (Medications Ordered): No orders of the defined types were placed in this encounter.  Orders:  No orders of the defined types were placed in this encounter.  Follow-up plan:   No  follow-ups on file.     {There is no content from the last Plan section.}   Recent Visits Date Type Provider Dept  11/30/18 Office Visit Gillis Santa, MD [facility]  Showing recent visits within past 90 days and meeting all other requirements   Today's Visits Date Type Provider Dept  01/23/19 Office Visit Gillis Santa, MD [facility]  Showing today's visits and meeting all other requirements   Future Appointments No visits were found meeting these conditions.  Showing future appointments within next 90 days and meeting all other requirements   I discussed the assessment  and treatment plan with the patient. The patient was provided an opportunity to ask questions and all were answered. The patient agreed with the plan and demonstrated an understanding of the instructions.  Patient advised to call back or seek an in-person evaluation if the symptoms or condition worsens.  Total duration of non-face-to-face encounter: *** minutes.  Note by: Gillis Santa, MD Date: 01/23/2019; Time: 10:29 AM  Note: This dictation was prepared with Dragon dictation. Any transcriptional errors that may result from this process are unintentional.  Disclaimer:  * Given the special circumstances of the COVID-19 pandemic, the federal government has announced that the Office for Civil Rights (OCR) will exercise its enforcement discretion and will not impose penalties on physicians using telehealth in the event of noncompliance with regulatory requirements under the [place] (HIPAA) in connection with the good faith provision of telehealth during the XX123456 national public health emergency. ([place])

## 2019-01-31 ENCOUNTER — Encounter (HOSPITAL_COMMUNITY): Payer: Self-pay

## 2019-01-31 NOTE — Patient Instructions (Addendum)
DUE TO COVID-19 ONLY ONE VISITOR IS ALLOWED TO COME WITH YOU AND STAY IN THE WAITING ROOM ONLY DURING PRE OP AND PROCEDURE. THE ONE VISITOR MAY VISIT WITH YOU IN YOUR PRIVATE ROOM DURING VISITING HOURS ONLY!!   COVID SWAB TESTING MUST BE COMPLETED ON: Tuesday, Sept. 8, 2020 at 11:15AM    [address], [place] [facility] enter pre surgical testing line (Must self quarantine after testing. Follow instructions on handout.)             Your procedure is scheduled on: Thursday, Sept. 10, 2020   Report to [facility] Main  Entrance    Report to admitting at 6:10 AM   Call this number if you have problems the morning of surgery [phone]   Do not eat food:After Midnight.   May have liquids until 5:40AM day of surgery   CLEAR LIQUID DIET  Foods Allowed                                                                     Foods Excluded  Water, Black Coffee and tea, regular and decaf                             liquids that you cannot  Plain Jell-O in any flavor  (No red)                                           see through such as: Fruit ices (not with fruit pulp)                                     milk, soups, orange juice  Iced Popsicles (No red)                                    All solid food Carbonated beverages, regular and diet                                    Apple juices Sports drinks like Gatorade (No red) Lightly seasoned clear broth or consume(fat free) Sugar, honey syrup  Sample Menu Breakfast                                Lunch                                     Supper Cranberry juice                    Beef broth                            Chicken broth Jell-O  Grape juice                           Apple juice Coffee or tea                        Jell-O                                      Popsicle                                                Coffee or tea                        Coffee or  tea   Complete one Ensure drink the morning of surgery at 5:40AM the day of surgery.   Brush your teeth the morning of surgery.   Do NOT smoke after Midnight   Take these medicines the morning of surgery with A SIP OF WATER: Cetirizine, Pravastatin, Pregablin, MSContin                               You may not have any metal on your body including jewelry, and body piercings             Do not wear lotions, powders, perfumes/cologne, or deodorant                         Men may shave face and neck.   Do not bring valuables to the hospital. [place].   Contacts, dentures or bridgework may not be worn into surgery.   Bring small overnight bag day of surgery.   Special Instructions: Bring a copy of your healthcare power of attorney and living will documents         the day of surgery if you haven't scanned them in before.              Please read over the following fact sheets you were given:  [facility] - Preparing for Surgery Before surgery, you can play an important role.  Because skin is not sterile, your skin needs to be as free of germs as possible.  You can reduce the number of germs on your skin by washing with CHG (chlorahexidine gluconate) soap before surgery.  CHG is an antiseptic cleaner which kills germs and bonds with the skin to continue killing germs even after washing. Please DO NOT use if you have an allergy to CHG or antibacterial soaps.  If your skin becomes reddened/irritated stop using the CHG and inform your nurse when you arrive at Short Stay. Do not shave (including legs and underarms) for at least 48 hours prior to the first CHG shower.  You may shave your face/neck.  Please follow these instructions carefully:  1.  Shower with CHG Soap the night before surgery and the  morning of surgery.  2.  If you choose to wash your hair, wash your hair first as usual with your normal  shampoo.  3.  After you shampoo,  rinse your hair and body thoroughly to remove the shampoo.                             4.  Use CHG as you would any other liquid soap.  You can apply chg directly to the skin and wash.  Gently with a scrungie or clean washcloth.  5.  Apply the CHG Soap to your body ONLY FROM THE NECK DOWN.   Do   not use on face/ open                           Wound or open sores. Avoid contact with eyes, ears mouth and   genitals (private parts).                       Wash face,  Genitals (private parts) with your normal soap.             6.  Wash thoroughly, paying special attention to the area where your    surgery  will be performed.  7.  Thoroughly rinse your body with warm water from the neck down.  8.  DO NOT shower/wash with your normal soap after using and rinsing off the CHG Soap.                9.  Pat yourself dry with a clean towel.            10.  Wear clean pajamas.            11.  Place clean sheets on your bed the night of your first shower and do not  sleep with pets. Day of Surgery : Do not apply any lotions/deodorants the morning of surgery.  Please wear clean clothes to the hospital/surgery center.  FAILURE TO FOLLOW THESE INSTRUCTIONS MAY RESULT IN THE CANCELLATION OF YOUR SURGERY  PATIENT SIGNATURE_________________________________  NURSE SIGNATURE__________________________________  ________________________________________________________________________   Adam Phenix  An incentive spirometer is a tool that can help keep your lungs clear and active. This tool measures how well you are filling your lungs with each breath. Taking long deep breaths may help reverse or decrease the chance of developing breathing (pulmonary) problems (especially infection) following:  A long period of time when you are unable to move or be active. BEFORE THE PROCEDURE   If the spirometer includes an indicator to show your best effort, your nurse or respiratory therapist will set it to a desired  goal.  If possible, sit up straight or lean slightly forward. Try not to slouch.  Hold the incentive spirometer in an upright position. INSTRUCTIONS FOR USE  1. Sit on the edge of your bed if possible, or sit up as far as you can in bed or on a chair. 2. Hold the incentive spirometer in an upright position. 3. Breathe out normally. 4. Place the mouthpiece in your mouth and seal your lips tightly around it. 5. Breathe in slowly and as deeply as possible, raising the piston or the ball toward the top of the column. 6. Hold your breath for 3-5 seconds or for as long as possible. Allow the piston or ball to fall to the bottom of the column. 7. Remove the mouthpiece from your mouth and breathe out normally. 8. Rest for a few seconds and repeat Steps 1 through 7 at  least 10 times every 1-2 hours when you are awake. Take your time and take a few normal breaths between deep breaths. 9. The spirometer may include an indicator to show your best effort. Use the indicator as a goal to work toward during each repetition. 10. After each set of 10 deep breaths, practice coughing to be sure your lungs are clear. If you have an incision (the cut made at the time of surgery), support your incision when coughing by placing a pillow or rolled up towels firmly against it. Once you are able to get out of bed, walk around indoors and cough well. You may stop using the incentive spirometer when instructed by your caregiver.  RISKS AND COMPLICATIONS  Take your time so you do not get dizzy or light-headed.  If you are in pain, you may need to take or ask for pain medication before doing incentive spirometry. It is harder to take a deep breath if you are having pain. AFTER USE  Rest and breathe slowly and easily.  It can be helpful to keep track of a log of your progress. Your caregiver can provide you with a simple table to help with this. If you are using the spirometer at home, follow these instructions: [place] IF:   You are having difficultly using the spirometer.  You have trouble using the spirometer as often as instructed.  Your pain medication is not giving enough relief while using the spirometer.  You develop fever of 100.5 F (38.1 C) or higher. SEEK IMMEDIATE MEDICAL CARE IF:   You cough up bloody sputum that had not been present before.  You develop fever of 102 F (38.9 C) or greater.  You develop worsening pain at or near the incision site. MAKE SURE YOU:   Understand these instructions.  Will watch your condition.  Will get help right away if you are not doing well or get worse. Document Released: 09/27/2006 Document Revised: 08/09/2011 Document Reviewed: 11/28/2006 Exit[employer] Patient Information 2014 Exit[employer], [place].   ________________________________________________________________________  WHAT IS A BLOOD TRANSFUSION? Blood Transfusion Information  A transfusion is the replacement of blood or some of its parts. Blood is made up of multiple cells which provide different functions.  Red blood cells carry oxygen and are used for blood loss replacement.  White blood cells fight against infection.  Platelets control bleeding.  Plasma helps clot blood.  Other blood products are available for specialized needs, such as hemophilia or other clotting disorders. BEFORE THE TRANSFUSION  Who gives blood for transfusions?   Healthy volunteers who are fully evaluated to make sure their blood is safe. This is blood bank blood. Transfusion therapy is the safest it has ever been in the practice of medicine. Before blood is taken from a donor, a complete history is taken to make sure that person has no history of diseases nor engages in risky social behavior (examples are intravenous drug use or sexual activity with multiple partners). The donor's travel history is screened to minimize risk of transmitting infections, such as malaria. The donated blood is tested for  signs of infectious diseases, such as HIV and hepatitis. The blood is then tested to be sure it is compatible with you in order to minimize the chance of a transfusion reaction. If you or a relative donates blood, this is often done in anticipation of surgery and is not appropriate for emergency situations. It takes many days to process the donated blood. RISKS AND COMPLICATIONS Although transfusion  therapy is very safe and saves many lives, the main dangers of transfusion include:   Getting an infectious disease.  Developing a transfusion reaction. This is an allergic reaction to something in the blood you were given. Every precaution is taken to prevent this. The decision to have a blood transfusion has been considered carefully by your caregiver before blood is given. Blood is not given unless the benefits outweigh the risks. AFTER THE TRANSFUSION  Right after receiving a blood transfusion, you will usually feel much better and more energetic. This is especially true if your red blood cells have gotten low (anemic). The transfusion raises the level of the red blood cells which carry oxygen, and this usually causes an energy increase.  The nurse administering the transfusion will monitor you carefully for complications. HOME CARE INSTRUCTIONS  No special instructions are needed after a transfusion. You may find your energy is better. Speak with your caregiver about any limitations on activity for underlying diseases you may have. SEEK MEDICAL CARE IF:   Your condition is not improving after your transfusion.  You develop redness or irritation at the intravenous (IV) site. SEEK IMMEDIATE MEDICAL CARE IF:  Any of the following symptoms occur over the next 12 hours:  Shaking chills.  You have a temperature by mouth above 102 F (38.9 C), not controlled by medicine.  Chest, back, or muscle pain.  People around you feel you are not acting correctly or are confused.  Shortness of breath  or difficulty breathing.  Dizziness and fainting.  You get a rash or develop hives.  You have a decrease in urine output.  Your urine turns a dark color or changes to pink, red, or brown. Any of the following symptoms occur over the next 10 days:  You have a temperature by mouth above 102 F (38.9 C), not controlled by medicine.  Shortness of breath.  Weakness after normal activity.  The white part of the eye turns yellow (jaundice).  You have a decrease in the amount of urine or are urinating less often.  Your urine turns a dark color or changes to pink, red, or brown. Document Released: 05/14/2000 Document Revised: 08/09/2011 Document Reviewed: 01/01/2008 [facility] Patient Information 2014 [place], [place].  _______________________________________________________________________

## 2019-02-01 ENCOUNTER — Other Ambulatory Visit: Payer: Self-pay

## 2019-02-01 ENCOUNTER — Encounter (HOSPITAL_COMMUNITY)
Admission: RE | Admit: 2019-02-01 | Discharge: 2019-02-01 | Disposition: A | Discharge status: Home / Self Care | Payer: Medicare Other | Source: Ambulatory Visit | Attending: Orthopedic Surgery | Admitting: Orthopedic Surgery

## 2019-02-01 ENCOUNTER — Other Ambulatory Visit (HOSPITAL_COMMUNITY): Payer: PRIVATE HEALTH INSURANCE

## 2019-02-01 ENCOUNTER — Encounter (HOSPITAL_COMMUNITY): Payer: Self-pay

## 2019-02-01 DIAGNOSIS — E785 Hyperlipidemia, unspecified: Secondary | ICD-10-CM | POA: Diagnosis not present

## 2019-02-01 DIAGNOSIS — G629 Polyneuropathy, unspecified: Secondary | ICD-10-CM | POA: Diagnosis not present

## 2019-02-01 DIAGNOSIS — M1711 Unilateral primary osteoarthritis, right knee: Secondary | ICD-10-CM | POA: Insufficient documentation

## 2019-02-01 DIAGNOSIS — I10 Essential (primary) hypertension: Secondary | ICD-10-CM | POA: Insufficient documentation

## 2019-02-01 DIAGNOSIS — K219 Gastro-esophageal reflux disease without esophagitis: Secondary | ICD-10-CM | POA: Diagnosis not present

## 2019-02-01 DIAGNOSIS — Z79899 Other long term (current) drug therapy: Secondary | ICD-10-CM | POA: Insufficient documentation

## 2019-02-01 DIAGNOSIS — R001 Bradycardia, unspecified: Secondary | ICD-10-CM | POA: Insufficient documentation

## 2019-02-01 DIAGNOSIS — Z87891 Personal history of nicotine dependence: Secondary | ICD-10-CM | POA: Insufficient documentation

## 2019-02-01 DIAGNOSIS — Z01818 Encounter for other preprocedural examination: Secondary | ICD-10-CM | POA: Insufficient documentation

## 2019-02-01 DIAGNOSIS — Z7982 Long term (current) use of aspirin: Secondary | ICD-10-CM | POA: Insufficient documentation

## 2019-02-01 HISTORY — DX: Other specified symptoms and signs involving the circulatory and respiratory systems: R09.89

## 2019-02-01 LAB — SURGICAL PCR SCREEN
MRSA, PCR: NEGATIVE
Staphylococcus aureus: NEGATIVE

## 2019-02-01 LAB — CBC
HCT: 42.2 % (ref 39.0–52.0)
Hemoglobin: 13.7 g/dL (ref 13.0–17.0)
MCH: 31.1 pg (ref 26.0–34.0)
MCHC: 32.5 g/dL (ref 30.0–36.0)
MCV: 95.7 fL (ref 80.0–100.0)
Platelets: 254 10*3/uL (ref 150–400)
RBC: 4.41 MIL/uL (ref 4.22–5.81)
RDW: 12.8 % (ref 11.5–15.5)
WBC: 7.1 10*3/uL (ref 4.0–10.5)
nRBC: 0 % (ref 0.0–0.2)

## 2019-02-01 LAB — BASIC METABOLIC PANEL
Anion gap: 9 (ref 5–15)
BUN: 24 mg/dL — ABNORMAL HIGH (ref 8–23)
CO2: 29 mmol/L (ref 22–32)
Calcium: 8.9 mg/dL (ref 8.9–10.3)
Chloride: 99 mmol/L (ref 98–111)
Creatinine, Ser: 1.05 mg/dL (ref 0.61–1.24)
GFR calc Af Amer: >60 mL/min (ref >60)
GFR calc non Af Amer: >60 mL/min (ref >60)
Glucose, Bld: 106 mg/dL — ABNORMAL HIGH (ref 70–99)
Potassium: 4.2 mmol/L (ref 3.5–5.1)
Sodium: 137 mmol/L (ref 135–145)

## 2019-02-01 NOTE — Progress Notes (Signed)
PCP - Cyndi Bender P.A. [facility] Cardiologist - Dr. Acie Fredrickson  Last seen 04/01/2014, has not needed him since  Chest x-ray - N/A EKG - 02/01/2019 in epic Stress Test-N/A ECHO - N/A Cardiac Cath - N/A  Sleep Study - N/A CPAP - N/A  Fasting Blood Sugar - N/A Checks Blood Sugar _N/A____ times a day  Blood Thinner Instructions:N/A Aspirin Instructions: Yes Last Dose: 01/28/2019 per pt.  Anesthesia review: N/A  Patient denies shortness of breath, fever, cough and chest pain at PAT appointment   Patient verbalized understanding of instructions that were given to them at the PAT appointment. Patient was also instructed that they will need to review over the PAT instructions again at home before surgery.

## 2019-02-01 NOTE — Progress Notes (Signed)
SPOKE W/  Darin Gomez     SCREENING SYMPTOMS OF COVID 19:   COUGH--NO  RUNNY NOSE--- NO  SORE THROAT---NO  NASAL CONGESTION----NO  SNEEZING----NO  SHORTNESS OF BREATH---NO  DIFFICULTY BREATHING---NO  TEMP >100.0 -----NO  UNEXPLAINED BODY ACHES------NO  CHILLS -------- NO  HEADACHES ---------NO  LOSS OF SMELL/ TASTE --------NO    HAVE YOU OR ANY FAMILY MEMBER TRAVELLED PAST 14 DAYS OUT OF THE   COUNTY---Lives in [place] STATE----NO COUNTRY----NO  HAVE YOU OR ANY FAMILY MEMBER BEEN EXPOSED TO ANYONE WITH COVID 19? NO

## 2019-02-02 LAB — ABO/RH: ABO/RH(D): A POS

## 2019-02-06 ENCOUNTER — Other Ambulatory Visit (HOSPITAL_COMMUNITY)
Admission: RE | Admit: 2019-02-06 | Discharge: 2019-02-06 | Disposition: A | Discharge status: Home / Self Care | Payer: Medicare Other | Source: Ambulatory Visit | Attending: Orthopedic Surgery | Admitting: Orthopedic Surgery

## 2019-02-06 DIAGNOSIS — Z20828 Contact with and (suspected) exposure to other viral communicable diseases: Secondary | ICD-10-CM | POA: Diagnosis not present

## 2019-02-06 DIAGNOSIS — Z01812 Encounter for preprocedural laboratory examination: Secondary | ICD-10-CM | POA: Insufficient documentation

## 2019-02-06 DIAGNOSIS — M1711 Unilateral primary osteoarthritis, right knee: Secondary | ICD-10-CM | POA: Diagnosis not present

## 2019-02-06 LAB — SARS CORONAVIRUS 2 (TAT 6-24 HRS): SARS Coronavirus 2: NEGATIVE

## 2019-02-06 NOTE — Progress Notes (Signed)
Anesthesia Chart Review   Case: [id number] Date/Time: 02/08/19 0825   Procedure: TOTAL KNEE ARTHROPLASTY (Right ) - 70 mins   Anesthesia type: Spinal   Pre-op diagnosis: Right knee osteoarthritis   Location: [facility] ROOM 10 / [facility]   Surgeon: Paralee Cancel, MD      DISCUSSION:67 y.o. former smoker with h/o PONV, spinal headaches, HLD, HTN, GERD, back surgeries (does not appear to have hardware), spinal cord stimulator in place, right knee OA scheduled for above procedure 02/08/2019 with Dr. Paralee Cancel.   Anticipate pt can proceed with planned procedure barring acute status change.   VS: BP 124/70 (BP Location: Right Arm)   Pulse (!) 57   Temp 36.9 C (Oral)   Resp 18   Ht 6' (1.829 m)   Wt 130.2 kg   SpO2 97%   BMI 38.94 kg/m   PROVIDERS: Cyndi Bender, PA-C is PCP   Mertie Moores, MD is Cardiologist  LABS: Labs reviewed: Acceptable for surgery. (all labs ordered are listed, but only abnormal results are displayed)  Labs Reviewed  BASIC METABOLIC PANEL - Abnormal; Notable for the following components:      Result Value   Glucose, Bld 106 (*)    BUN 24 (*)    All other components within normal limits  SURGICAL PCR SCREEN  CBC  TYPE AND SCREEN  ABO/RH     IMAGES:   EKG: 02/01/2019 Rate 56 bpm Sinus bradycardia Otherwise normal ECG  CV: Stress Test 04/10/2014 Impression:  Mild reversibility in the lateral wall, concerning for focal ischemia in this area.  No fixed defects are identified to suggest infarct. SPECT study otherwise normal.  Wall motion and ejection fraction analyses are within normal limits.  Past Medical History:  Diagnosis Date  . Allergy   . Arthritis    mainly in back  . Constipation   . Diverticulosis 02/14/2018   noted on colonoscopy  . Environmental allergies   . GERD (gastroesophageal reflux disease)   . Headache(784.0)    occas.  migraines  (from last surgery)  . Heart murmur    as child....Marland Kitchenno problems now  . History of colon  polyps 02/14/2018  . Hyperlipidemia   . Hypertension    dx  10 yrs or more  ago  . Insomnia   . Neuropathy   . PONV (postoperative nausea and vomiting)   . Poor circulation of extremity    legs  . Shortness of breath dyspnea    occasional  . Spinal headache   . Vertigo     Past Surgical History:  Procedure Laterality Date  . BACK SURGERY    . CARDIAC CATHETERIZATION     03/04/14  . COLONOSCOPY    . FRACTURE SURGERY     left arm  . KNEE SURGERY Right 01/02/2018   torn minicus  . LEFT HEART CATHETERIZATION WITH CORONARY ANGIOGRAM N/A 03/04/2014   Procedure: LEFT HEART CATHETERIZATION WITH CORONARY ANGIOGRAM;  Surgeon: Peter M Martinique, MD;  Location: [facility] CATH LAB;  Service: Cardiovascular;  Laterality: N/A;  . LUMBAR LAMINECTOMY/DECOMPRESSION MICRODISCECTOMY  03/15/2012   Procedure: LUMBAR LAMINECTOMY/DECOMPRESSION MICRODISCECTOMY 2 LEVELS;  Surgeon: Floyce Stakes, MD;  Location: [facility] NEURO ORS;  Service: Neurosurgery;  Laterality: Bilateral;  Bilateral Lumbar three, lumbar four, lumbar five Laminectomy  . POLYPECTOMY    . SPINAL CORD STIMULATOR INSERTION N/A 05/29/2014   Procedure: LUMBAR SPINAL CORD STIMULATOR INSERTION;  Surgeon: Bonna Gains, MD;  Location: [facility] NEURO ORS;  Service: Neurosurgery;  Laterality: N/A;  MEDICATIONS: . morphine (MS CONTIN) 30 MG 12 hr tablet  . aspirin EC 81 MG tablet  . baclofen (LIORESAL) 20 MG tablet  . cetirizine (ZYRTEC) 10 MG tablet  . lisinopril-hydrochlorothiazide (PRINZIDE,ZESTORETIC) 10-12.5 MG per tablet  . meclizine (ANTIVERT) 25 MG tablet  . Melatonin 10 MG CAPS  . meloxicam (MOBIC) 15 MG tablet  . Multiple Vitamins-Minerals (MULTIVITAMIN WITH MINERALS) tablet  . nitroGLYCERIN (NITROSTAT) 0.4 MG SL tablet  . nystatin cream (MYCOSTATIN)  . pravastatin (PRAVACHOL) 80 MG tablet  . pregabalin (LYRICA) 75 MG capsule  . promethazine (PHENERGAN) 12.5 MG tablet  . sildenafil (VIAGRA) 100 MG tablet  . Vitamin D, Ergocalciferol,  (DRISDOL) 50000 UNITS CAPS capsule   No current facility-administered medications for this encounter.     Maia Plan [facility] Pre-Surgical Testing [phone] 02/06/19  3:41 PM

## 2019-02-07 MED ORDER — DEXTROSE 5 % IV SOLN
3.0000 g | INTRAVENOUS | Status: AC
  Administered 2019-02-08: 3 g via INTRAVENOUS
  Filled 2019-02-07: qty 3

## 2019-02-08 ENCOUNTER — Ambulatory Visit (HOSPITAL_COMMUNITY): Payer: Medicare Other | Admitting: Anesthesiology

## 2019-02-08 ENCOUNTER — Ambulatory Visit (HOSPITAL_COMMUNITY): Payer: Medicare Other | Admitting: Physician Assistant

## 2019-02-08 ENCOUNTER — Encounter (HOSPITAL_COMMUNITY): Payer: Self-pay | Admitting: *Deleted

## 2019-02-08 ENCOUNTER — Other Ambulatory Visit: Payer: Self-pay

## 2019-02-08 ENCOUNTER — Encounter (HOSPITAL_COMMUNITY)
Admission: RE | Disposition: A | Discharge status: Home / Self Care | Payer: Self-pay | Source: Home / Self Care | Attending: Orthopedic Surgery

## 2019-02-08 ENCOUNTER — Observation Stay (HOSPITAL_COMMUNITY)
Admission: RE | Admit: 2019-02-08 | Discharge: 2019-02-09 | Disposition: A | Discharge status: Home / Self Care | Payer: Medicare Other | Attending: Orthopedic Surgery | Admitting: Orthopedic Surgery

## 2019-02-08 DIAGNOSIS — Z791 Long term (current) use of non-steroidal anti-inflammatories (NSAID): Secondary | ICD-10-CM | POA: Insufficient documentation

## 2019-02-08 DIAGNOSIS — Z7982 Long term (current) use of aspirin: Secondary | ICD-10-CM | POA: Diagnosis not present

## 2019-02-08 DIAGNOSIS — I10 Essential (primary) hypertension: Secondary | ICD-10-CM | POA: Diagnosis not present

## 2019-02-08 DIAGNOSIS — K5900 Constipation, unspecified: Secondary | ICD-10-CM | POA: Diagnosis not present

## 2019-02-08 DIAGNOSIS — Z96659 Presence of unspecified artificial knee joint: Secondary | ICD-10-CM | POA: Insufficient documentation

## 2019-02-08 DIAGNOSIS — K219 Gastro-esophageal reflux disease without esophagitis: Secondary | ICD-10-CM | POA: Diagnosis not present

## 2019-02-08 DIAGNOSIS — E785 Hyperlipidemia, unspecified: Secondary | ICD-10-CM | POA: Insufficient documentation

## 2019-02-08 DIAGNOSIS — M1711 Unilateral primary osteoarthritis, right knee: Principal | ICD-10-CM | POA: Insufficient documentation

## 2019-02-08 DIAGNOSIS — Z79899 Other long term (current) drug therapy: Secondary | ICD-10-CM | POA: Insufficient documentation

## 2019-02-08 DIAGNOSIS — Z96651 Presence of right artificial knee joint: Secondary | ICD-10-CM

## 2019-02-08 HISTORY — PX: TOTAL KNEE ARTHROPLASTY: SHX125

## 2019-02-08 LAB — TYPE AND SCREEN
ABO/RH(D): A POS
Antibody Screen: NEGATIVE

## 2019-02-08 SURGERY — ARTHROPLASTY, KNEE, TOTAL
Anesthesia: Regional | Site: Knee | Laterality: Right

## 2019-02-08 MED ORDER — FENTANYL CITRATE (PF) 100 MCG/2ML IJ SOLN
INTRAMUSCULAR | Status: DC | PRN
  Administered 2019-02-08: 50 ug via INTRAVENOUS
  Administered 2019-02-08: 25 ug via INTRAVENOUS
  Administered 2019-02-08 (×3): 50 ug via INTRAVENOUS
  Administered 2019-02-08: 25 ug via INTRAVENOUS

## 2019-02-08 MED ORDER — DIPHENHYDRAMINE HCL 12.5 MG/5ML PO ELIX: 12.5000 mg | ORAL_SOLUTION | ORAL | Status: DC | PRN

## 2019-02-08 MED ORDER — ACETAMINOPHEN 160 MG/5ML PO SOLN: 1000.0000 mg | Freq: Once | ORAL | Status: DC | PRN

## 2019-02-08 MED ORDER — PROPOFOL 10 MG/ML IV BOLUS
INTRAVENOUS | Status: DC | PRN
  Administered 2019-02-08: 200 mg via INTRAVENOUS

## 2019-02-08 MED ORDER — STERILE WATER FOR IRRIGATION IR SOLN
Status: DC | PRN
  Administered 2019-02-08: 2000 mL

## 2019-02-08 MED ORDER — ACETAMINOPHEN 500 MG PO TABS: 1000.0000 mg | ORAL_TABLET | Freq: Once | ORAL | Status: DC | PRN

## 2019-02-08 MED ORDER — MIDAZOLAM HCL 2 MG/2ML IJ SOLN
1.0000 mg | Freq: Once | INTRAMUSCULAR | Status: AC
  Administered 2019-02-08: 08:00:00 2 mg via INTRAVENOUS

## 2019-02-08 MED ORDER — BUPIVACAINE HCL (PF) 0.25 % IJ SOLN
INTRAMUSCULAR | Status: AC
  Filled 2019-02-08: qty 30

## 2019-02-08 MED ORDER — ONDANSETRON HCL 4 MG/2ML IJ SOLN
INTRAMUSCULAR | Status: AC
  Filled 2019-02-08: qty 2

## 2019-02-08 MED ORDER — MECLIZINE HCL 25 MG PO TABS: 25.0000 mg | ORAL_TABLET | Freq: Three times a day (TID) | ORAL | Status: DC | PRN

## 2019-02-08 MED ORDER — DOCUSATE SODIUM 100 MG PO CAPS
100.0000 mg | ORAL_CAPSULE | Freq: Two times a day (BID) | ORAL | Status: DC
  Administered 2019-02-08 – 2019-02-09 (×3): 100 mg via ORAL
  Filled 2019-02-08 (×3): qty 1

## 2019-02-08 MED ORDER — KETOROLAC TROMETHAMINE 30 MG/ML IJ SOLN
INTRAMUSCULAR | Status: DC | PRN
  Administered 2019-02-08: 30 mg via INTRA_ARTICULAR

## 2019-02-08 MED ORDER — KETOROLAC TROMETHAMINE 30 MG/ML IJ SOLN
INTRAMUSCULAR | Status: AC
  Filled 2019-02-08: qty 1

## 2019-02-08 MED ORDER — PHENOL 1.4 % MT LIQD
1.0000 | OROMUCOSAL | Status: DC | PRN
  Filled 2019-02-08: qty 177

## 2019-02-08 MED ORDER — SODIUM CHLORIDE (PF) 0.9 % IJ SOLN
INTRAMUSCULAR | Status: AC
  Filled 2019-02-08: qty 50

## 2019-02-08 MED ORDER — BUPIVACAINE HCL (PF) 0.25 % IJ SOLN
INTRAMUSCULAR | Status: DC | PRN
  Administered 2019-02-08: 30 mL

## 2019-02-08 MED ORDER — FENTANYL CITRATE (PF) 100 MCG/2ML IJ SOLN
25.0000 ug | INTRAMUSCULAR | Status: DC | PRN
  Administered 2019-02-08 (×3): 50 ug via INTRAVENOUS

## 2019-02-08 MED ORDER — SODIUM CHLORIDE 0.9 % IV SOLN
INTRAVENOUS | Status: DC
  Administered 2019-02-08 – 2019-02-09 (×2): via INTRAVENOUS

## 2019-02-08 MED ORDER — LIDOCAINE HCL (CARDIAC) PF 100 MG/5ML IV SOSY
PREFILLED_SYRINGE | INTRAVENOUS | Status: DC | PRN
  Administered 2019-02-08: 100 mg via INTRAVENOUS

## 2019-02-08 MED ORDER — ACETAMINOPHEN 10 MG/ML IV SOLN
INTRAVENOUS | Status: AC
  Filled 2019-02-08: qty 100

## 2019-02-08 MED ORDER — BACLOFEN 20 MG PO TABS
20.0000 mg | ORAL_TABLET | Freq: Three times a day (TID) | ORAL | Status: DC
  Administered 2019-02-08 – 2019-02-09 (×4): 20 mg via ORAL
  Filled 2019-02-08 (×4): qty 1

## 2019-02-08 MED ORDER — HYDROMORPHONE HCL 1 MG/ML IJ SOLN
0.5000 mg | INTRAMUSCULAR | Status: DC | PRN
  Administered 2019-02-08 – 2019-02-09 (×3): 1 mg via INTRAVENOUS
  Filled 2019-02-08 (×3): qty 1

## 2019-02-08 MED ORDER — SODIUM CHLORIDE 0.9 % IR SOLN
Status: DC | PRN
  Administered 2019-02-08: 1000 mL

## 2019-02-08 MED ORDER — ALUM & MAG HYDROXIDE-SIMETH 200-200-20 MG/5ML PO SUSP: 15.0000 mL | ORAL | Status: DC | PRN

## 2019-02-08 MED ORDER — PROPOFOL 10 MG/ML IV BOLUS
INTRAVENOUS | Status: AC
  Filled 2019-02-08: qty 20

## 2019-02-08 MED ORDER — OXYCODONE HCL 5 MG PO TABS
10.0000 mg | ORAL_TABLET | ORAL | Status: DC | PRN
  Administered 2019-02-08: 10 mg via ORAL
  Filled 2019-02-08: qty 2

## 2019-02-08 MED ORDER — MIDAZOLAM HCL 2 MG/2ML IJ SOLN
INTRAMUSCULAR | Status: AC
  Administered 2019-02-08: 2 mg via INTRAVENOUS
  Filled 2019-02-08: qty 2

## 2019-02-08 MED ORDER — SODIUM CHLORIDE (PF) 0.9 % IJ SOLN
INTRAMUSCULAR | Status: DC | PRN
  Administered 2019-02-08: 30 mL

## 2019-02-08 MED ORDER — PREGABALIN 75 MG PO CAPS
75.0000 mg | ORAL_CAPSULE | Freq: Three times a day (TID) | ORAL | Status: DC
  Administered 2019-02-08 – 2019-02-09 (×4): 75 mg via ORAL
  Filled 2019-02-08 (×4): qty 1

## 2019-02-08 MED ORDER — CELECOXIB 200 MG PO CAPS
200.0000 mg | ORAL_CAPSULE | Freq: Two times a day (BID) | ORAL | Status: DC
  Administered 2019-02-08 – 2019-02-09 (×3): 200 mg via ORAL
  Filled 2019-02-08 (×3): qty 1

## 2019-02-08 MED ORDER — LIDOCAINE 2% (20 MG/ML) 5 ML SYRINGE
INTRAMUSCULAR | Status: AC
  Filled 2019-02-08: qty 5

## 2019-02-08 MED ORDER — ACETAMINOPHEN 10 MG/ML IV SOLN
1000.0000 mg | Freq: Once | INTRAVENOUS | Status: DC | PRN
  Administered 2019-02-08: 1000 mg via INTRAVENOUS

## 2019-02-08 MED ORDER — MAGNESIUM CITRATE PO SOLN: 1.0000 | Freq: Once | ORAL | Status: DC | PRN

## 2019-02-08 MED ORDER — FENTANYL CITRATE (PF) 100 MCG/2ML IJ SOLN
50.0000 ug | Freq: Once | INTRAMUSCULAR | Status: AC
  Administered 2019-02-08: 50 ug via INTRAVENOUS
  Filled 2019-02-08: qty 2

## 2019-02-08 MED ORDER — METOCLOPRAMIDE HCL 5 MG/ML IJ SOLN: 5.0000 mg | Freq: Three times a day (TID) | INTRAMUSCULAR | Status: DC | PRN

## 2019-02-08 MED ORDER — TRANEXAMIC ACID-NACL 1000-0.7 MG/100ML-% IV SOLN
1000.0000 mg | INTRAVENOUS | Status: AC
  Administered 2019-02-08: 1000 mg via INTRAVENOUS
  Filled 2019-02-08: qty 100

## 2019-02-08 MED ORDER — DEXAMETHASONE SODIUM PHOSPHATE 10 MG/ML IJ SOLN
INTRAMUSCULAR | Status: AC
  Filled 2019-02-08: qty 1

## 2019-02-08 MED ORDER — ASPIRIN 81 MG PO CHEW
81.0000 mg | CHEWABLE_TABLET | Freq: Two times a day (BID) | ORAL | Status: DC
  Administered 2019-02-08 – 2019-02-09 (×2): 81 mg via ORAL
  Filled 2019-02-08 (×2): qty 1

## 2019-02-08 MED ORDER — ROPIVACAINE HCL 7.5 MG/ML IJ SOLN
INTRAMUSCULAR | Status: DC | PRN
  Administered 2019-02-08: 20 mL via PERINEURAL

## 2019-02-08 MED ORDER — FENTANYL CITRATE (PF) 250 MCG/5ML IJ SOLN
INTRAMUSCULAR | Status: AC
  Filled 2019-02-08: qty 5

## 2019-02-08 MED ORDER — OXYCODONE HCL 5 MG PO TABS
5.0000 mg | ORAL_TABLET | Freq: Once | ORAL | Status: AC | PRN
  Administered 2019-02-08: 10 mg via ORAL

## 2019-02-08 MED ORDER — FERROUS SULFATE 325 (65 FE) MG PO TABS
325.0000 mg | ORAL_TABLET | Freq: Two times a day (BID) | ORAL | Status: DC
  Administered 2019-02-09 (×2): 325 mg via ORAL
  Filled 2019-02-08 (×2): qty 1

## 2019-02-08 MED ORDER — ONDANSETRON HCL 4 MG PO TABS: 4.0000 mg | ORAL_TABLET | Freq: Four times a day (QID) | ORAL | Status: DC | PRN

## 2019-02-08 MED ORDER — MENTHOL 3 MG MT LOZG: 1.0000 | LOZENGE | OROMUCOSAL | Status: DC | PRN

## 2019-02-08 MED ORDER — LACTATED RINGERS IV SOLN
INTRAVENOUS | Status: DC
  Administered 2019-02-08: 07:00:00 via INTRAVENOUS

## 2019-02-08 MED ORDER — ONDANSETRON HCL 4 MG/2ML IJ SOLN
INTRAMUSCULAR | Status: DC | PRN
  Administered 2019-02-08: 4 mg via INTRAVENOUS

## 2019-02-08 MED ORDER — NITROGLYCERIN 0.4 MG SL SUBL: 0.4000 mg | SUBLINGUAL_TABLET | SUBLINGUAL | Status: DC | PRN

## 2019-02-08 MED ORDER — ACETAMINOPHEN 500 MG PO TABS
1000.0000 mg | ORAL_TABLET | Freq: Four times a day (QID) | ORAL | Status: AC
  Administered 2019-02-08 – 2019-02-09 (×4): 1000 mg via ORAL
  Filled 2019-02-08 (×4): qty 2

## 2019-02-08 MED ORDER — DEXAMETHASONE SODIUM PHOSPHATE 10 MG/ML IJ SOLN
10.0000 mg | Freq: Once | INTRAMUSCULAR | Status: AC
  Administered 2019-02-08: 10 mg via INTRAVENOUS

## 2019-02-08 MED ORDER — METHOCARBAMOL 500 MG IVPB - SIMPLE MED
500.0000 mg | Freq: Four times a day (QID) | INTRAVENOUS | Status: DC | PRN
  Administered 2019-02-08: 500 mg via INTRAVENOUS
  Filled 2019-02-08: qty 50

## 2019-02-08 MED ORDER — BISACODYL 10 MG RE SUPP: 10.0000 mg | Freq: Every day | RECTAL | Status: DC | PRN

## 2019-02-08 MED ORDER — CEFAZOLIN SODIUM-DEXTROSE 2-4 GM/100ML-% IV SOLN
2.0000 g | Freq: Four times a day (QID) | INTRAVENOUS | Status: AC
  Administered 2019-02-08 (×2): 2 g via INTRAVENOUS
  Filled 2019-02-08 (×2): qty 100

## 2019-02-08 MED ORDER — DEXAMETHASONE SODIUM PHOSPHATE 10 MG/ML IJ SOLN
10.0000 mg | Freq: Once | INTRAMUSCULAR | Status: AC
  Administered 2019-02-09: 10 mg via INTRAVENOUS
  Filled 2019-02-08: qty 1

## 2019-02-08 MED ORDER — OXYCODONE HCL 5 MG PO TABS
ORAL_TABLET | ORAL | Status: AC
  Filled 2019-02-08: qty 2

## 2019-02-08 MED ORDER — TRANEXAMIC ACID-NACL 1000-0.7 MG/100ML-% IV SOLN
1000.0000 mg | Freq: Once | INTRAVENOUS | Status: AC
  Administered 2019-02-08: 1000 mg via INTRAVENOUS
  Filled 2019-02-08: qty 100

## 2019-02-08 MED ORDER — CHLORHEXIDINE GLUCONATE 4 % EX LIQD: 60.0000 mL | Freq: Once | CUTANEOUS | Status: DC

## 2019-02-08 MED ORDER — METOCLOPRAMIDE HCL 5 MG PO TABS: 5.0000 mg | ORAL_TABLET | Freq: Three times a day (TID) | ORAL | Status: DC | PRN

## 2019-02-08 MED ORDER — OXYCODONE HCL 5 MG/5ML PO SOLN: 5.0000 mg | Freq: Once | ORAL | Status: AC | PRN

## 2019-02-08 MED ORDER — OXYCODONE HCL 5 MG/5ML PO SOLN: 5.0000 mg | Freq: Once | ORAL | Status: DC | PRN

## 2019-02-08 MED ORDER — ONDANSETRON HCL 4 MG/2ML IJ SOLN: 4.0000 mg | Freq: Four times a day (QID) | INTRAMUSCULAR | Status: DC | PRN

## 2019-02-08 MED ORDER — LORATADINE 10 MG PO TABS
10.0000 mg | ORAL_TABLET | Freq: Every day | ORAL | Status: DC
  Administered 2019-02-09: 10 mg via ORAL
  Filled 2019-02-08: qty 1

## 2019-02-08 MED ORDER — MORPHINE SULFATE ER 30 MG PO TBCR
30.0000 mg | EXTENDED_RELEASE_TABLET | Freq: Two times a day (BID) | ORAL | Status: DC
  Administered 2019-02-08 – 2019-02-09 (×2): 30 mg via ORAL
  Filled 2019-02-08 (×2): qty 1

## 2019-02-08 MED ORDER — OXYCODONE HCL 5 MG PO TABS: 5.0000 mg | ORAL_TABLET | ORAL | Status: DC | PRN

## 2019-02-08 MED ORDER — METHOCARBAMOL 500 MG IVPB - SIMPLE MED
INTRAVENOUS | Status: AC
  Filled 2019-02-08: qty 50

## 2019-02-08 MED ORDER — 0.9 % SODIUM CHLORIDE (POUR BTL) OPTIME
TOPICAL | Status: DC | PRN
  Administered 2019-02-08: 1000 mL

## 2019-02-08 MED ORDER — HYDROMORPHONE HCL 1 MG/ML IJ SOLN
0.2500 mg | INTRAMUSCULAR | Status: DC | PRN
  Administered 2019-02-08 (×4): 0.5 mg via INTRAVENOUS

## 2019-02-08 MED ORDER — METHOCARBAMOL 500 MG PO TABS
500.0000 mg | ORAL_TABLET | Freq: Four times a day (QID) | ORAL | Status: DC | PRN
  Administered 2019-02-09: 500 mg via ORAL
  Filled 2019-02-08: qty 1

## 2019-02-08 MED ORDER — OXYCODONE HCL 5 MG PO TABS: 5.0000 mg | ORAL_TABLET | Freq: Once | ORAL | Status: DC | PRN

## 2019-02-08 MED ORDER — PRAVASTATIN SODIUM 20 MG PO TABS
80.0000 mg | ORAL_TABLET | Freq: Every day | ORAL | Status: DC
  Administered 2019-02-09: 80 mg via ORAL
  Filled 2019-02-08: qty 4

## 2019-02-08 MED ORDER — HYDROMORPHONE HCL 1 MG/ML IJ SOLN
INTRAMUSCULAR | Status: AC
  Filled 2019-02-08: qty 2

## 2019-02-08 MED ORDER — POLYETHYLENE GLYCOL 3350 17 G PO PACK
17.0000 g | PACK | Freq: Two times a day (BID) | ORAL | Status: DC
  Administered 2019-02-08 – 2019-02-09 (×2): 17 g via ORAL
  Filled 2019-02-08 (×2): qty 1

## 2019-02-08 MED ORDER — FENTANYL CITRATE (PF) 100 MCG/2ML IJ SOLN
INTRAMUSCULAR | Status: AC
  Filled 2019-02-08: qty 4

## 2019-02-08 MED ORDER — POVIDONE-IODINE 10 % EX SWAB
2.0000 "application " | Freq: Once | CUTANEOUS | Status: AC
  Administered 2019-02-08: 2 via TOPICAL

## 2019-02-08 SURGICAL SUPPLY — 65 items
ADH SKN CLS APL DERMABOND .7 (GAUZE/BANDAGES/DRESSINGS) ×1
ATTUNE MED ANAT PAT 38 KNEE (Knees) ×1 IMPLANT
ATTUNE MED ANAT PAT 38MM KNEE (Knees) ×1 IMPLANT
ATTUNE PS FEM RT SZ 7 CEM KNEE (Femur) ×2 IMPLANT
ATTUNE PSRP INSR SZ7 8 KNEE (Insert) ×1 IMPLANT
ATTUNE PSRP INSR SZ7 8MM KNEE (Insert) ×1 IMPLANT
BAG SPEC THK2 15X12 ZIP CLS (MISCELLANEOUS)
BAG ZIPLOCK 12X15 (MISCELLANEOUS) IMPLANT
BASE TIBIAL ROT PLAT SZ 8 KNEE (Knees) IMPLANT
BLADE SAW SGTL 11.0X1.19X90.0M (BLADE) IMPLANT
BLADE SAW SGTL 13.0X1.19X90.0M (BLADE) ×3 IMPLANT
BLADE SURG SZ10 CARB STEEL (BLADE) ×6 IMPLANT
BNDG CMPR MED 15X6 ELC VLCR LF (GAUZE/BANDAGES/DRESSINGS) ×1
BNDG ELASTIC 6X15 VLCR STRL LF (GAUZE/BANDAGES/DRESSINGS) ×2 IMPLANT
BNDG ELASTIC 6X5.8 VLCR STR LF (GAUZE/BANDAGES/DRESSINGS) ×3 IMPLANT
BOWL SMART MIX CTS (DISPOSABLE) ×3 IMPLANT
BSPLAT TIB 8 CMNT ROT PLAT STR (Knees) ×1 IMPLANT
CEMENT HV SMART SET (Cement) ×4 IMPLANT
COVER SURGICAL LIGHT HANDLE (MISCELLANEOUS) ×3 IMPLANT
COVER WAND RF STERILE (DRAPES) IMPLANT
CUFF TOURN SGL QUICK 34 (TOURNIQUET CUFF) ×3
CUFF TRNQT CYL 34X4.125X (TOURNIQUET CUFF) ×1 IMPLANT
DECANTER SPIKE VIAL GLASS SM (MISCELLANEOUS) ×6 IMPLANT
DERMABOND ADVANCED (GAUZE/BANDAGES/DRESSINGS) ×2
DERMABOND ADVANCED .7 DNX12 (GAUZE/BANDAGES/DRESSINGS) ×1 IMPLANT
DRAPE U-SHAPE 47X51 STRL (DRAPES) ×3 IMPLANT
DRESSING AQUACEL AG SP 3.5X10 (GAUZE/BANDAGES/DRESSINGS) ×1 IMPLANT
DRSG AQUACEL AG SP 3.5X10 (GAUZE/BANDAGES/DRESSINGS) ×3
DURAPREP 26ML APPLICATOR (WOUND CARE) ×6 IMPLANT
ELECT REM PT RETURN 15FT ADLT (MISCELLANEOUS) ×3 IMPLANT
GLOVE BIO SURGEON STRL SZ 6 (GLOVE) ×3 IMPLANT
GLOVE BIOGEL PI IND STRL 6.5 (GLOVE) ×1 IMPLANT
GLOVE BIOGEL PI IND STRL 7.5 (GLOVE) ×1 IMPLANT
GLOVE BIOGEL PI IND STRL 8.5 (GLOVE) ×1 IMPLANT
GLOVE BIOGEL PI INDICATOR 6.5 (GLOVE) ×2
GLOVE BIOGEL PI INDICATOR 7.5 (GLOVE) ×2
GLOVE BIOGEL PI INDICATOR 8.5 (GLOVE) ×2
GOWN STRL REUS W/ TWL LRG LVL3 (GOWN DISPOSABLE) ×1 IMPLANT
GOWN STRL REUS W/TWL 2XL LVL3 (GOWN DISPOSABLE) ×3 IMPLANT
GOWN STRL REUS W/TWL LRG LVL3 (GOWN DISPOSABLE) ×6 IMPLANT
HANDPIECE INTERPULSE COAX TIP (DISPOSABLE) ×3
HOLDER FOLEY CATH W/STRAP (MISCELLANEOUS) IMPLANT
KIT TURNOVER KIT A (KITS) IMPLANT
MANIFOLD NEPTUNE II (INSTRUMENTS) ×3 IMPLANT
NDL SAFETY ECLIPSE 18X1.5 (NEEDLE) IMPLANT
NEEDLE HYPO 18GX1.5 SHARP (NEEDLE)
NS IRRIG 1000ML POUR BTL (IV SOLUTION) ×3 IMPLANT
PACK TOTAL KNEE CUSTOM (KITS) ×3 IMPLANT
PIN DRILL FIX HALF THREAD (BIT) ×2 IMPLANT
PIN FIX SIGMA LCS THRD HI (PIN) ×2 IMPLANT
PROTECTOR NERVE ULNAR (MISCELLANEOUS) ×3 IMPLANT
SET HNDPC FAN SPRY TIP SCT (DISPOSABLE) ×1 IMPLANT
SET PAD KNEE POSITIONER (MISCELLANEOUS) ×3 IMPLANT
SUT MNCRL AB 4-0 PS2 18 (SUTURE) ×3 IMPLANT
SUT STRATAFIX PDS+ 0 24IN (SUTURE) ×3 IMPLANT
SUT VIC AB 1 CT1 36 (SUTURE) ×3 IMPLANT
SUT VIC AB 2-0 CT1 27 (SUTURE) ×9
SUT VIC AB 2-0 CT1 TAPERPNT 27 (SUTURE) ×3 IMPLANT
SYR 3ML LL SCALE MARK (SYRINGE) ×3 IMPLANT
TIBIAL BASE ROT PLAT SZ 8 KNEE (Knees) ×3 IMPLANT
TRAY FOL W/BAG SLVR 16FR STRL (SET/KITS/TRAYS/PACK) IMPLANT
TRAY FOLEY W/BAG SLVR 16FR LF (SET/KITS/TRAYS/PACK) ×3
WATER STERILE IRR 1000ML POUR (IV SOLUTION) ×6 IMPLANT
WRAP KNEE MAXI GEL POST OP (GAUZE/BANDAGES/DRESSINGS) ×3 IMPLANT
YANKAUER SUCT BULB TIP 10FT TU (MISCELLANEOUS) ×3 IMPLANT

## 2019-02-08 NOTE — Op Note (Signed)
NAME:  Darin Gomez RECORD NO.:  [id number]                             FACILITY:  [facility]      PHYSICIAN:  Pietro Cassis. Alvan Dame, M.D.  DATE OF BIRTH:  [birth year]      DATE OF PROCEDURE:  02/08/2019                                     OPERATIVE REPORT         PREOPERATIVE DIAGNOSIS:  Right knee osteoarthritis.      POSTOPERATIVE DIAGNOSIS:  Right knee osteoarthritis.      FINDINGS:  The patient was noted to have complete loss of cartilage and   bone-on-bone arthritis with associated osteophytes in the medial and patellofemoral compartments of   the knee with a significant synovitis and associated effusion.  The patient had failed months of conservative treatment including medications, injection therapy, activity modification.     PROCEDURE:  Right total knee replacement.      COMPONENTS USED:  DePuy Attune rotating platform posterior stabilized knee   system, a size 7 femur, 8 tibia, size 8 mm PS AOX insert, and 38 anatomic patellar   button.      SURGEON:  Pietro Cassis. Alvan Dame, M.D.      ASSISTANT:  Danae Orleans, PA-C.      ANESTHESIA:  General and Regional.      SPECIMENS:  None.      COMPLICATION:  None.      DRAINS:  None.  EBL: <200cc      TOURNIQUET TIME:   Total Tourniquet Time Documented: Thigh (Right) - 29 minutes Total: Thigh (Right) - 29 minutes  .      The patient was stable to the recovery room.      INDICATION FOR PROCEDURE:  Darin Gomez is a 68 y.o. male patient of   mine.  The patient had been seen, evaluated, and treated for months conservatively in the   office with medication, activity modification, and injections.  The patient had   radiographic changes of bone-on-bone arthritis with endplate sclerosis and osteophytes noted.  Based on the radiographic changes and failed conservative measures, the patient   decided to proceed with definitive treatment, total knee replacement.  Risks of infection, DVT, component failure,  need for revision surgery, neurovascular injury were reviewed in the office setting.  The postop course was reviewed stressing the efforts to maximize post-operative satisfaction and function.  Consent was obtained for benefit of pain   relief.      PROCEDURE IN DETAIL:  The patient was brought to the operative theater.   Once adequate anesthesia, preoperative antibiotics, 2 gm of Ancef,1 gm of Tranexamic Acid, and 10 mg of Decadron administered, the patient was positioned supine with a right thigh tourniquet placed.  The  right lower extremity was prepped and draped in sterile fashion.  A time-   out was performed identifying the patient, planned procedure, and the appropriate extremity.      The right lower extremity was placed in the [facility] leg holder.  The leg was   exsanguinated, tourniquet elevated to 250 mmHg.  A midline incision was   made  followed by median parapatellar arthrotomy.  Following initial   exposure, attention was first directed to the patella.  Precut   measurement was noted to be 27 mm.  I resected down to 15 mm and used a   38 anatomic patellar button to restore patellar height as well as cover the cut surface.      The lug holes were drilled and a metal shim was placed to protect the   patella from retractors and saw blade during the procedure.      At this point, attention was now directed to the femur.  The femoral   canal was opened with a drill, irrigated to try to prevent fat emboli.  An   intramedullary rod was passed at 5 degrees valgus, 9 mm of bone was   resected off the distal femur.  Following this resection, the tibia was   subluxated anteriorly.  Using the extramedullary guide, 2 mm of bone was resected off   the proximal medial tibia.  We confirmed the gap would be   stable medially and laterally with a size 6 spacer block as well as confirmed that the tibial cut was perpendicular in the coronal plane, checking with an alignment rod.      Once this was  done, I sized the femur to be a size 7 in the anterior-   posterior dimension, chose a standard component based on medial and   lateral dimension.  The size 7 rotation block was then pinned in   position anterior referenced using the C-clamp to set rotation.  The   anterior, posterior, and  chamfer cuts were made without difficulty nor   notching making certain that I was along the anterior cortex to help   with flexion gap stability.      The final box cut was made off the lateral aspect of distal femur.      At this point, the tibia was sized to be a size 8.  The size 8 tray was   then pinned in position through the medial third of the tubercle,   drilled, and keel punched.  Trial reduction was now carried with a 7 femur,  8 tibia, a size 6 then to 8 mm PS insert, and the 38 anatomic patella botton.  The knee was brought to full extension with good flexion stability with the patella   tracking through the trochlea without application of pressure.  Given   all these findings the trial components removed.  Final components were   opened and cement was mixed.  The knee was irrigated with normal saline solution and pulse lavage.  The synovial lining was   then injected with 30 cc of 0.25% Marcaine with epinephrine, 1 cc of Toradol and 30 cc of NS for a total of 61 cc.     Final implants were then cemented onto cleaned and dried cut surfaces of bone with the knee brought to extension with a size 8 mm PS trial insert.      Once the cement had fully cured, excess cement was removed   throughout the knee.  I confirmed that I was satisfied with the range of   motion and stability, and the final size 8 mm PS AOX insert was chosen.  It was   placed into the knee.      The tourniquet had been let down at 29 minutes.  No significant   hemostasis was required.  The extensor mechanism was then reapproximated using #  1 Vicryl and #1 Stratafix sutures with the knee   in flexion.  The   remaining wound  was closed with 2-0 Vicryl and running 4-0 Monocryl.   The knee was cleaned, dried, dressed sterilely using Dermabond and   Aquacel dressing.  The patient was then   brought to recovery room in stable condition, tolerating the procedure   well.   Please note that Physician Assistant, Danae Orleans, PA-C was present for the entirety of the case, and was utilized for pre-operative positioning, peri-operative retractor management, general facilitation of the procedure and for primary wound closure at the end of the case.              Pietro Cassis Alvan Dame, M.D.    02/08/2019 10:04 AM

## 2019-02-08 NOTE — Transfer of Care (Signed)
Immediate Anesthesia Transfer of Care Note  Patient: Darin Gomez  Procedure(s) Performed: TOTAL KNEE ARTHROPLASTY (Right Knee)  Patient Location: PACU  Anesthesia Type:General  Level of Consciousness: awake, alert  and oriented  Airway & Oxygen Therapy: Patient Spontanous Breathing and Patient connected to face mask oxygen  Post-op Assessment: Report given to RN and Post -op Vital signs reviewed and stable  Post vital signs: Reviewed and stable  Last Vitals:  Vitals Value Taken Time  BP 184/90 02/08/19 1031  Temp    Pulse 71 02/08/19 1032  Resp 13 02/08/19 1032  SpO2 100 % 02/08/19 1032  Vitals shown include unvalidated device data.  Last Pain:  Vitals:   02/08/19 0653  TempSrc: Oral         Complications: No apparent anesthesia complications

## 2019-02-08 NOTE — Anesthesia Procedure Notes (Signed)
Anesthesia Regional Block: Adductor canal block   Pre-Anesthetic Checklist: ,, timeout performed, Correct Patient, Correct Site, Correct Laterality, Correct Procedure, Correct Position, site marked, Risks and benefits discussed,  Surgical consent,  Pre-op evaluation,  At surgeon's request and post-op pain management  Laterality: Right and Lower  Prep: chloraprep       Needles:  Injection technique: Single-shot     Needle Length: 9cm  Needle Gauge: 22     Additional Needles: Arrow StimuQuik ECHO Echogenic Stimulating PNB Needle  Procedures:,,,, ultrasound used (permanent image in chart),,,,  Narrative:  Start time: 02/08/2019 8:01 AM End time: 02/08/2019 8:08 AM Injection made incrementally with aspirations every 5 mL.  Performed by: Personally  Anesthesiologist: Oleta Mouse, MD

## 2019-02-08 NOTE — Progress Notes (Signed)
AssistedDr. Moser with right, ultrasound guided, adductor canal block. Side rails up, monitors on throughout procedure. See vital signs in flow sheet. Tolerated Procedure well.  

## 2019-02-08 NOTE — Interval H&P Note (Signed)
History and Physical Interval Note:  02/08/2019 7:11 AM  Darin Gomez  has presented today for surgery, with the diagnosis of Right knee osteoarthritis.  The various methods of treatment have been discussed with the patient and family. After consideration of risks, benefits and other options for treatment, the patient has consented to  Procedure(s) with comments: TOTAL KNEE ARTHROPLASTY (Right) - 70 mins as a surgical intervention.  The patient's history has been reviewed, patient examined, no change in status, stable for surgery.  I have reviewed the patient's chart and labs.  Questions were answered to the patient's satisfaction.     [name]

## 2019-02-08 NOTE — Anesthesia Preprocedure Evaluation (Addendum)
Anesthesia Evaluation  Patient identified by MRN, date of birth, ID band Patient awake    Reviewed: Allergy & Precautions, NPO status , Patient's Chart, lab work & pertinent test results  History of Anesthesia Complications (+) PONV, POST - OP SPINAL HEADACHE and history of anesthetic complications  Airway Mallampati: II  TM Distance: >3 FB Neck ROM: Full    Dental  (+) Teeth Intact, Missing,    Pulmonary shortness of breath, neg sleep apnea, neg recent URI, former smoker,    breath sounds clear to auscultation       Cardiovascular hypertension, Pt. on medications + Peripheral Vascular Disease  (-) Past MI and (-) CHF  Rhythm:Regular     Neuro/Psych  Headaches,  Neuromuscular disease    GI/Hepatic GERD  Controlled,  Endo/Other  Morbid obesity  Renal/GU      Musculoskeletal  (+) Arthritis ,   Abdominal   Peds  Hematology negative hematology ROS (+)   Anesthesia Other Findings Neg stress 2015  MS Contin, spinal cord stim, cauda equina with neuropathy, h/o dural tear  Reproductive/Obstetrics                             Anesthesia Physical Anesthesia Plan  ASA: III  Anesthesia Plan: General and Regional   Post-op Pain Management:    Induction: Intravenous  PONV Risk Score and Plan: 3 and Ondansetron and Dexamethasone  Airway Management Planned: LMA and Oral ETT  Additional Equipment: None  Intra-op Plan:   Post-operative Plan: Extubation in OR  Informed Consent: I have reviewed the patients History and Physical, chart, labs and discussed the procedure including the risks, benefits and alternatives for the proposed anesthesia with the patient or authorized representative who has indicated his/her understanding and acceptance.     Dental advisory given  Plan Discussed with: CRNA and Surgeon  Anesthesia Plan Comments:         Anesthesia Quick Evaluation

## 2019-02-08 NOTE — Anesthesia Procedure Notes (Signed)
Procedure Name: LMA Insertion Date/Time: 02/08/2019 8:46 AM Performed by: Glory Buff, CRNA Pre-anesthesia Checklist: Patient identified, Emergency Drugs available, Suction available and Patient being monitored Patient Re-evaluated:Patient Re-evaluated prior to induction Oxygen Delivery Method: Circle system utilized Preoxygenation: Pre-oxygenation with 100% oxygen Induction Type: IV induction LMA: LMA inserted LMA Size: 4.0 Number of attempts: 1 Placement Confirmation: positive ETCO2 Tube secured with: Tape Dental Injury: Teeth and Oropharynx as per pre-operative assessment

## 2019-02-08 NOTE — Discharge Instructions (Signed)

## 2019-02-08 NOTE — Evaluation (Signed)
Physical Therapy Evaluation Patient Details Name: Darin Gomez MRN: [id number] DOB: September 03, 1950 Today's Date: 02/08/2019   History of Present Illness  68 yo male s/p R TKR on 02/08/19. PMH includes chronic pain, series of spinal surgeries with cauda equina syndrome resulting in bilateral foot neuropathy and LE weakness, s/p spinal cord stimulator, R knee pain, OA, GERD, headaches, HTN, HLD, dyspnea, vertigo.  Clinical Impression   Pt presents with moderate to severe R knee pain, decreased R knee strength, decreased R knee ROM ~10-50* limited by pain, difficulty performing mobility tasks, and decreased activity tolerance. Pt to benefit from acute PT to address deficits. Pt ambulated room distance with RW with min guard assist, verbal cuing for form and safety provided throughout. Pt educated on ankle pumps (20/hour) to perform this afternoon/evening to increase circulation, to pt's tolerance and limited by pain. PT to progress mobility as tolerated, and will continue to follow acutely.        Follow Up Recommendations Follow surgeon's recommendation for DC plan and follow-up therapies;Supervision for mobility/OOB    Equipment Recommendations  3in1 (PT);Cane    Recommendations for Other Services       Precautions / Restrictions Precautions Precautions: Fall Restrictions Weight Bearing Restrictions: No Other Position/Activity Restrictions: WBAT      Mobility  Bed Mobility Overal bed mobility: Needs Assistance Bed Mobility: Supine to Sit     Supine to sit: HOB elevated;Min assist     General bed mobility comments: Min assist for RLE lifting, translation to EOB. Increased time and effort with use of bedrails to perform. Pt reporting dizziness upon sitting EOB, BP 135/62 and HR 73.  Transfers Overall transfer level: Needs assistance Equipment used: Rolling walker (2 wheeled) Transfers: Sit to/from Stand Sit to Stand: Min assist;From elevated surface         General  transfer comment: Min assist for power up, steadying. Increased time to rise to standing. Pt able to shift weight L and R without difficulty.  Ambulation/Gait Ambulation/Gait assistance: Min guard Gait Distance (Feet): 12 Feet Assistive device: Rolling walker (2 wheeled) Gait Pattern/deviations: Step-to pattern;Decreased step length - right;Decreased step length - left;Decreased weight shift to right;Decreased stance time - right;Antalgic;Trunk flexed Gait velocity: decr   General Gait Details: Min guard for safety, verbal cuing for sequencing, placement in RW, upright posture. Pt limited by pain and fatigue.  Stairs            Wheelchair Mobility    Modified Rankin (Stroke Patients Only)       Balance Overall balance assessment: Mild deficits observed, not formally tested;History of Falls                                           Pertinent Vitals/Pain Pain Assessment: 0-10 Pain Score: 6  Pain Location: R knee Pain Descriptors / Indicators: Sore Pain Intervention(s): Limited activity within patient's tolerance;Monitored during session;Premedicated before session;Repositioned;Ice applied    Home Living Family/patient expects to be discharged to:: Private residence Living Arrangements: Spouse/significant other Available Help at Discharge: Family;Available 24 hours/day Type of Home: House Home Access: Stairs to enter;Ramped entrance   Entrance Stairs-Number of Steps: 5 Home Layout: One level Home Equipment: Walker - 2 wheels;Shower seat;Cane - single point Additional Comments: has been using cane since 2012, needs a new one    Prior Function Level of Independence: Needs assistance   Gait / Transfers Assistance  Needed: pt using cane for ambulation PTA, since 2012 due to back surgery complications and cauda equina.  ADL's / Homemaking Assistance Needed: Pt occasionally requires dressing help, otherwise pt independent with ADLs.        Hand  Dominance   Dominant Hand: Right    Extremity/Trunk Assessment   Upper Extremity Assessment Upper Extremity Assessment: Overall WFL for tasks assessed    Lower Extremity Assessment Lower Extremity Assessment: Generalized weakness;RLE deficits/detail RLE Deficits / Details: suspected post-surgical weakness; able to perform ankle pumps, quad set, heel slide, SLR without lift assist and ~10* quad lag RLE Sensation: WNL;history of peripheral neuropathy(bilateral feet due to cauda equina)    Cervical / Trunk Assessment Cervical / Trunk Assessment: Normal  Communication   Communication: No difficulties  Cognition Arousal/Alertness: Awake/alert Behavior During Therapy: WFL for tasks assessed/performed Overall Cognitive Status: Within Functional Limits for tasks assessed                                        General Comments      Exercises     Assessment/Plan    PT Assessment Patient needs continued PT services  PT Problem List Decreased strength;Decreased mobility;Decreased range of motion;Decreased activity tolerance;Decreased balance;Pain;Decreased knowledge of use of DME       PT Treatment Interventions DME instruction;Therapeutic activities;Gait training;Therapeutic exercise;Patient/family education;Balance training;Stair training;Functional mobility training    PT Goals (Current goals can be found in the Care Plan section)  Acute Rehab PT Goals Patient Stated Goal: go home tomorrow if possible PT Goal Formulation: With patient Time For Goal Achievement: 02/15/19 Potential to Achieve Goals: Good    Frequency 7X/week   Barriers to discharge        Co-evaluation               AM-PAC PT "6 Clicks" Mobility  Outcome Measure Help needed turning from your back to your side while in a flat bed without using bedrails?: A Little Help needed moving from lying on your back to sitting on the side of a flat bed without using bedrails?: A Little Help  needed moving to and from a bed to a chair (including a wheelchair)?: A Little Help needed standing up from a chair using your arms (e.g., wheelchair or bedside chair)?: A Little Help needed to walk in hospital room?: A Little Help needed climbing 3-5 steps with a railing? : A Lot 6 Click Score: 17    End of Session Equipment Utilized During Treatment: Gait belt Activity Tolerance: Patient tolerated treatment well;Patient limited by pain Patient left: in chair;with chair alarm set;with call bell/phone within reach;with SCD's reapplied;with family/visitor present Nurse Communication: Mobility status PT Visit Diagnosis: Other abnormalities of gait and mobility (R26.89);Difficulty in walking, not elsewhere classified (R26.2)    Time: [id number] PT Time Calculation (min) (ACUTE ONLY): 39 min   Charges:   PT Evaluation $PT Eval Low Complexity: 1 Low PT Treatments $Gait Training: 8-22 mins        Julien Girt, PT Acute Rehabilitation Services Pager [phone]  Office [phone]   Fey Coghill D Elonda Husky 02/08/2019, 5:42 PM

## 2019-02-09 ENCOUNTER — Encounter (HOSPITAL_COMMUNITY): Payer: Self-pay | Admitting: Orthopedic Surgery

## 2019-02-09 DIAGNOSIS — M1711 Unilateral primary osteoarthritis, right knee: Secondary | ICD-10-CM | POA: Diagnosis not present

## 2019-02-09 LAB — CBC
HCT: 30.4 % — ABNORMAL LOW (ref 39.0–52.0)
Hemoglobin: 10 g/dL — ABNORMAL LOW (ref 13.0–17.0)
MCH: 31.1 pg (ref 26.0–34.0)
MCHC: 32.9 g/dL (ref 30.0–36.0)
MCV: 94.4 fL (ref 80.0–100.0)
Platelets: 204 10*3/uL (ref 150–400)
RBC: 3.22 MIL/uL — ABNORMAL LOW (ref 4.22–5.81)
RDW: 12.3 % (ref 11.5–15.5)
WBC: 10.8 10*3/uL — ABNORMAL HIGH (ref 4.0–10.5)
nRBC: 0 % (ref 0.0–0.2)

## 2019-02-09 LAB — BASIC METABOLIC PANEL
Anion gap: 11 (ref 5–15)
BUN: 20 mg/dL (ref 8–23)
CO2: 24 mmol/L (ref 22–32)
Calcium: 7.8 mg/dL — ABNORMAL LOW (ref 8.9–10.3)
Chloride: 97 mmol/L — ABNORMAL LOW (ref 98–111)
Creatinine, Ser: 0.94 mg/dL (ref 0.61–1.24)
GFR calc Af Amer: >60 mL/min (ref >60)
GFR calc non Af Amer: >60 mL/min (ref >60)
Glucose, Bld: 152 mg/dL — ABNORMAL HIGH (ref 70–99)
Potassium: 4.1 mmol/L (ref 3.5–5.1)
Sodium: 132 mmol/L — ABNORMAL LOW (ref 135–145)

## 2019-02-09 MED ORDER — OXYCODONE HCL 5 MG PO TABS: 5.0000 mg | ORAL_TABLET | Freq: Four times a day (QID) | ORAL | 0 refills | Status: DC | PRN

## 2019-02-09 MED ORDER — ASPIRIN 81 MG PO CHEW: 81.0000 mg | CHEWABLE_TABLET | Freq: Two times a day (BID) | ORAL | 0 refills | Status: AC

## 2019-02-09 MED ORDER — ACETAMINOPHEN 500 MG PO TABS: 1000.0000 mg | ORAL_TABLET | Freq: Three times a day (TID) | ORAL | 0 refills | Status: AC

## 2019-02-09 MED ORDER — TRAMADOL HCL 50 MG PO TABS: 50.0000 mg | ORAL_TABLET | Freq: Four times a day (QID) | ORAL | 0 refills | Status: DC | PRN

## 2019-02-09 MED ORDER — DOCUSATE SODIUM 100 MG PO CAPS: 100.0000 mg | ORAL_CAPSULE | Freq: Two times a day (BID) | ORAL | 0 refills | Status: DC

## 2019-02-09 MED ORDER — TRAMADOL HCL 50 MG PO TABS: 50.0000 mg | ORAL_TABLET | Freq: Four times a day (QID) | ORAL | Status: DC | PRN

## 2019-02-09 MED ORDER — POLYETHYLENE GLYCOL 3350 17 G PO PACK: 17.0000 g | PACK | Freq: Two times a day (BID) | ORAL | 0 refills | Status: AC

## 2019-02-09 MED ORDER — FERROUS SULFATE 325 (65 FE) MG PO TABS: 325.0000 mg | ORAL_TABLET | Freq: Three times a day (TID) | ORAL | 0 refills | Status: DC

## 2019-02-09 NOTE — Progress Notes (Signed)
[name], male   DOB: 06-10-1950, 68 y.o.   MRN: [id number] Subjective: 1 Day Post-Op Procedure(s) (LRB): TOTAL KNEE ARTHROPLASTY (Right)    Patient reports pain as moderate.  Up with therapy a little yesterday. No events  Objective:   VITALS:   Vitals:   02/09/19 0158 02/09/19 0619  BP: 120/68 118/68  Pulse: (!) 58 60  Resp: 19 18  Temp: 97.8 F (36.6 C) 97.6 F (36.4 C)  SpO2: 98% 99%    Neurovascular intact Incision: dressing C/D/I  LABS Recent Labs    02/09/19 0238  HGB 10.0*  HCT 30.4*  WBC 10.8*  PLT 204    Recent Labs    02/09/19 0238  NA 132*  K 4.1  BUN 20  CREATININE 0.94  GLUCOSE 152*    No results for input(s): LABPT, INR in the last 72 hours.   Assessment/Plan: 1 Day Post-Op Procedure(s) (LRB): TOTAL KNEE ARTHROPLASTY (Right)   Advance diet Up with therapy   D/C to home today after therapy Reviewed goals RTC in 2 weeks Reviewed icing and showering recommendations

## 2019-02-09 NOTE — TOC Transition Note (Addendum)
Transition of Care [facility]) - CM/SW Discharge Note   Patient Details  Name: Darin Gomez MRN: [id number] Date of Birth: Dec 02, 1950  [facility] CM/SW Contact:  Lia Hopping, [place] Phone Number: 02/09/2019, 10:15 AM   Clinical Narrative:    Therapy Plan: OPPT 3 in 1 and RW ordered through [place] and delivered to the patient room.   **Update: After PT session patient requires wide RW. RW now ordered through [place] @ 11:15am to Rep. Zack Blank. Previous walker returned to [place].   Final next level of care: OP Rehab Barriers to Discharge: No Barriers Identified   Patient Goals and CMS Choice Patient states their goals for this hospitalization and ongoing recovery are:: to get better      Discharge Placement  Home                      Discharge Plan and Services                DME Arranged: 3-N-1, Walker rolling DME Agency: [employer] Date DME Agency Contacted: 02/09/19 Time DME Agency Contacted: [phone] Representative spoke with at DME Agency: [place] ([place]) Interventions     Readmission Risk Interventions No flowsheet data found.

## 2019-02-09 NOTE — Progress Notes (Signed)
PHYSICAL THERAPY  Pt requires a Bariatric RW vs standard for safe D/C  Pt is 6'4" 185lbs not meeting the weight requirement however due to pt' Neuropathy and Hx of multiple back surgeries he requires extra support and present with excessive WBing B UE's.  Attempted amb with standard RW, gait and balance is unsafe/unsteady.  Pt is unable to clear hips "inside" walker and is considered a HIGHER FALL RISK with standard  RW.    Rica Koyanagi  PTA Acute  Rehabilitation Services Pager      [phone] Office      [phone]

## 2019-02-09 NOTE — Progress Notes (Signed)
Physical Therapy Treatment Patient Details Name: Darin Gomez MRN: [id number] DOB: [birth year] Today's Date: 02/09/2019    History of Present Illness 68 yo male s/p R TKR on 02/08/19. PMH includes chronic pain, series of spinal surgeries with cauda equina syndrome resulting in bilateral foot neuropathy and LE weakness, s/p spinal cord stimulator, R knee pain, OA, GERD, headaches, HTN, HLD, dyspnea, vertigo.    PT Comments    POD # 1 am session Assisted OOB. General bed mobility comments: used leg loop belt to assist R LE OOB with increased time.  Assisted with amb.  General Gait Details: Min guard for safety, verbal cuing for sequencing, placement in RW, upright posture. Gilford Rile is too narrow/small to support pt safely. Then returned to room to perform some TE's following HEP handout.  Instructed on proper tech, freq as well as use of ICE.     Follow Up Recommendations  Follow surgeon's recommendation for DC plan and follow-up therapies;Supervision for mobility/OOB     Equipment Recommendations  Rolling walker with 5" wheels;3in1 (PT)(Bariatric)    Recommendations for Other Services       Precautions / Restrictions Precautions Precautions: Fall Precaution Comments: back stimulator Restrictions Weight Bearing Restrictions: No Other Position/Activity Restrictions: WBAT    Mobility  Bed Mobility Overal bed mobility: Needs Assistance Bed Mobility: Supine to Sit           General bed mobility comments: used leg loop belt to assist R LE OOB with increased time  Transfers Overall transfer level: Needs assistance Equipment used: Rolling walker (2 wheeled) Transfers: Sit to/from Stand Sit to Stand: Min assist;Min guard         General transfer comment: from elevated bed and 25% VC's on proper hand placement and safety with turns  Ambulation/Gait Ambulation/Gait assistance: Min guard;Supervision Gait Distance (Feet): 65 Feet Assistive device: Rolling walker (2  wheeled) Gait Pattern/deviations: Step-to pattern;Decreased step length - right;Decreased step length - left;Decreased weight shift to right;Decreased stance time - right;Antalgic;Trunk flexed Gait velocity: decreased   General Gait Details: Min guard for safety, verbal cuing for sequencing, placement in RW, upright posture. Waler is too narrow/small to support pt safely.   Stairs             Wheelchair Mobility    Modified Rankin (Stroke Patients Only)       Balance                                            Cognition Arousal/Alertness: Awake/alert Behavior During Therapy: WFL for tasks assessed/performed Overall Cognitive Status: Within Functional Limits for tasks assessed                                        Exercises   Total Knee Replacement TE's 10 reps B LE ankle pumps 10 reps towel squeezes 10 reps knee presses 10 reps heel slides  10 reps SAQ's 10 reps SLR's 10 reps ABD Followed by ICE     General Comments        Pertinent Vitals/Pain Pain Assessment: 0-10 Pain Score: 5  Pain Location: R knee Pain Descriptors / Indicators: Sore;Grimacing Pain Intervention(s): Monitored during session;Premedicated before session;Ice applied;Repositioned    Home Living  Prior Function            PT Goals (current goals can now be found in the care plan section) Progress towards PT goals: Progressing toward goals    Frequency    7X/week      PT Plan Current plan remains appropriate    Co-evaluation              AM-PAC PT "6 Clicks" Mobility   Outcome Measure  Help needed turning from your back to your side while in a flat bed without using bedrails?: A Little Help needed moving from lying on your back to sitting on the side of a flat bed without using bedrails?: A Little Help needed moving to and from a bed to a chair (including a wheelchair)?: A Little Help needed standing up  from a chair using your arms (e.g., wheelchair or bedside chair)?: A Little Help needed to walk in hospital room?: A Little Help needed climbing 3-5 steps with a railing? : A Little 6 Click Score: 18    End of Session Equipment Utilized During Treatment: Gait belt Activity Tolerance: Patient limited by fatigue;No increased pain Patient left: in chair;with chair alarm set;with call bell/phone within reach;with SCD's reapplied;with family/visitor present Nurse Communication: Mobility status(pt will need a second PT session for gait safety) PT Visit Diagnosis: Other abnormalities of gait and mobility (R26.89);Difficulty in walking, not elsewhere classified (R26.2)     Time: 1100-1140 PT Time Calculation (min) (ACUTE ONLY): 40 min  Charges:  $Gait Training: 8-22 mins $Therapeutic Exercise: 8-22 mins $Therapeutic Activity: 8-22 mins                     Rica Koyanagi  PTA Acute  Rehabilitation Services Pager      [phone] Office      [phone]

## 2019-02-09 NOTE — Progress Notes (Signed)
Physical Therapy Treatment Patient Details Name: Darin Gomez MRN: [id number] DOB: 29-Apr-1951 Today's Date: 02/09/2019    History of Present Illness 68 yo male s/p R TKR on 02/08/19. PMH includes chronic pain, series of spinal surgeries with cauda equina syndrome resulting in bilateral foot neuropathy and LE weakness, s/p spinal cord stimulator, R knee pain, OA, GERD, headaches, HTN, HLD, dyspnea, vertigo.    PT Comments    POD # 1 pm session Spouse present for "hands on" instruction on transfers, gait and stairs.  Pt c/o increased pain.  Recently took a Tylenol, stated spouse.  Assisted with amb and practiced one step.  Pt reports pain 10/10 after session.   Pt stated he can not take Oxy meds "they make me sick".  RN called to room for pain management rec.  Pt has met Therapy goals to D/C to home later today if pain is manageable.   Follow Up Recommendations  Follow surgeon's recommendation for DC plan and follow-up therapies;Supervision for mobility/OOB     Equipment Recommendations  Rolling walker with 5" wheels;3in1 (PT)(Bariatric)    Recommendations for Other Services       Precautions / Restrictions Precautions Precautions: Fall Precaution Comments: back stimulator Restrictions Weight Bearing Restrictions: No Other Position/Activity Restrictions: WBAT    Mobility  Bed Mobility Overal bed mobility: Needs Assistance Bed Mobility: Supine to Sit           General bed mobility comments: OOB in recliner  Transfers Overall transfer level: Needs assistance Equipment used: Rolling walker (2 wheeled) Transfers: Sit to/from Stand Sit to Stand: Min assist;Min guard         General transfer comment: from elevated bed and 25% VC's on proper hand placement and safety with turns  Ambulation/Gait Ambulation/Gait assistance: Min guard;Supervision Gait Distance (Feet): 85 Feet Assistive device: Rolling walker (2 wheeled) Gait Pattern/deviations: Step-to  pattern;Decreased step length - right;Decreased step length - left;Decreased weight shift to right;Decreased stance time - right;Antalgic;Trunk flexed Gait velocity: decreased   General Gait Details: Min guard for safety, verbal cuing for sequencing, placement in RW, upright posture. Waler is too narrow/small to support pt safely.   Stairs Stairs: Yes Stairs assistance: Min assist Stair Management: No rails Number of Stairs: 1 General stair comments: spouse present for "hands on" assist/education.  Pt has a ramp but also has one 7 inch step up into home.   Wheelchair Mobility    Modified Rankin (Stroke Patients Only)       Balance                                            Cognition Arousal/Alertness: Awake/alert Behavior During Therapy: WFL for tasks assessed/performed Overall Cognitive Status: Within Functional Limits for tasks assessed                                        Exercises      General Comments        Pertinent Vitals/Pain Pain Assessment: 0-10 Pain Score: 5  Pain Location: R knee Pain Descriptors / Indicators: Sore;Grimacing Pain Intervention(s): Monitored during session;Premedicated before session;Ice applied;Repositioned    Home Living                      Prior Function  PT Goals (current goals can now be found in the care plan section) Progress towards PT goals: Progressing toward goals    Frequency    7X/week      PT Plan Current plan remains appropriate    Co-evaluation              AM-PAC PT "6 Clicks" Mobility   Outcome Measure  Help needed turning from your back to your side while in a flat bed without using bedrails?: A Little Help needed moving from lying on your back to sitting on the side of a flat bed without using bedrails?: A Little Help needed moving to and from a bed to a chair (including a wheelchair)?: A Little Help needed standing up from a chair  using your arms (e.g., wheelchair or bedside chair)?: A Little Help needed to walk in hospital room?: A Little Help needed climbing 3-5 steps with a railing? : A Little 6 Click Score: 18    End of Session Equipment Utilized During Treatment: Gait belt Activity Tolerance: Patient limited by fatigue;No increased pain Patient left: in chair;with chair alarm set;with call bell/phone within reach;with SCD's reapplied;with family/visitor present Nurse Communication: Mobility status(pt will need a second PT session for gait safety) PT Visit Diagnosis: Other abnormalities of gait and mobility (R26.89);Difficulty in walking, not elsewhere classified (R26.2)     Time: 2955-3971 PT Time Calculation (min) (ACUTE ONLY): 31 min  Charges:  $Gait Training: 8-22 mins $Therapeutic Activity: 8-22 mins                     Rica Koyanagi  PTA Acute  Rehabilitation Services Pager      [phone] Office      [phone]

## 2019-02-11 NOTE — Anesthesia Postprocedure Evaluation (Signed)
Anesthesia Post Note  Patient: Darin Gomez  Procedure(s) Performed: TOTAL KNEE ARTHROPLASTY (Right Knee)     Patient location during evaluation: PACU Anesthesia Type: Regional and General Level of consciousness: awake and alert Pain management: pain level controlled Vital Signs Assessment: post-procedure vital signs reviewed and stable Respiratory status: spontaneous breathing, nonlabored ventilation, respiratory function stable and patient connected to nasal cannula oxygen Cardiovascular status: blood pressure returned to baseline and stable Postop Assessment: no apparent nausea or vomiting Anesthetic complications: no    Last Vitals:  Vitals:   02/09/19 0825 02/09/19 1255  BP: (!) 115/57 132/70  Pulse: 60 (!) 57  Resp: 15 18  Temp: 36.8 C 36.6 C  SpO2: 96% 97%    Last Pain:  Vitals:   02/09/19 1255  TempSrc: Oral  PainSc:                  Medrith Veillon

## 2019-02-14 NOTE — Discharge Summary (Signed)
Physician Discharge Summary  [name] MRN: [id number] DOB/AGE: 12/26/1950 68 y.o.  Admit date: 02/08/2019 Discharge date: 02/09/2019   Procedures:  Procedure(s) (LRB): TOTAL KNEE ARTHROPLASTY (Right)  Attending Physician:  Dr. Paralee Cancel   Admission Diagnoses:   Right knee primary OA / pain  Discharge Diagnoses:  Principal Problem:   S/P right TKA  Past Medical History:  Diagnosis Date   Allergy    Arthritis    mainly in back   Constipation    Diverticulosis 02/14/2018   noted on colonoscopy   Environmental allergies    GERD (gastroesophageal reflux disease)    Headache(784.0)    occas.  migraines  (from last surgery)   Heart murmur    as child....Marland Kitchenno problems now   History of colon polyps 02/14/2018   Hyperlipidemia    Hypertension    dx  10 yrs or more  ago   Insomnia    Neuropathy    PONV (postoperative nausea and vomiting)    Poor circulation of extremity    legs   Shortness of breath dyspnea    occasional   Spinal headache    Vertigo     HPI:    Darin Gomez, 68 y.o. male, has a history of pain and functional disability in the right knee due to arthritis and has failed non-surgical conservative treatments for greater than 12 weeks to includeNSAID's and/or analgesics, corticosteriod injections, use of assistive devices and activity modification.  Onset of symptoms was gradual, starting 2+ years ago with gradually worsening course since that time. The patient noted prior procedures on the knee to include  arthroscopy and menisectomy on the right knee.  Patient currently rates pain in the right knee(s) at 8 out of 10 with activity. Patient has night pain, worsening of pain with activity and weight bearing, pain that interferes with activities of daily living, pain with passive range of motion, crepitus and joint swelling.  Patient has evidence of periarticular osteophytes and joint space narrowing by imaging studies.   There is no active infection. Risks, benefits and expectations were discussed with the patient.  Risks including but not limited to the risk of anesthesia, blood clots, nerve damage, blood vessel damage, failure of the prosthesis, infection and up to and including death.  Patient understand the risks, benefits and expectations and wishes to proceed with surgery.   PCP: Cyndi Bender, PA-C   Discharged Condition: good  Hospital Course:  Patient underwent the above stated procedure on 02/08/2019. Patient tolerated the procedure well and brought to the recovery room in good condition and subsequently to the floor.  POD #1 BP: 118/68 ; Pulse: 60 ; Temp: 97.6 F (36.4 C) ; Resp: 18 Patient reports pain as moderate.  Up with therapy a little yesterday. No events.  Ready to be discharged home.  Neurovascular intact and incision: dressing C/D/I.   LABS  Basename    HGB     10.0  HCT     30.4    Discharge Exam: General appearance: alert, cooperative and no distress Extremities: Homans sign is negative, no sign of DVT, no edema, redness or tenderness in the calves or thighs and no ulcers, gangrene or trophic changes  Disposition:  Home with follow up in 2 weeks   Follow-up Information    Paralee Cancel, MD. Schedule an appointment as soon as possible for a visit in 2 weeks.   Specialty: Orthopedic Surgery Contact information: [address] [place]  [place] [id number]           Discharge Instructions    Call MD / Call 911   Complete by: As directed    If you experience chest pain or shortness of breath, CALL 911 and be transported to the hospital emergency room.  If you develope a fever above 101 F, pus (white drainage) or increased drainage or redness at the wound, or calf pain, call your surgeon's office.   Change dressing   Complete by: As directed    Maintain surgical dressing until follow up in the clinic. If the edges start to pull up, may reinforce with  tape. If the dressing is no longer working, may remove and cover with gauze and tape, but must keep the area dry and clean.  Call with any questions or concerns.   Constipation Prevention   Complete by: As directed    Drink plenty of fluids.  Prune juice may be helpful.  You may use a stool softener, such as Colace (over the counter) 100 mg twice a day.  Use MiraLax (over the counter) for constipation as needed.   Diet - low sodium heart healthy   Complete by: As directed    Discharge instructions   Complete by: As directed    Maintain surgical dressing until follow up in the clinic. If the edges start to pull up, may reinforce with tape. If the dressing is no longer working, may remove and cover with gauze and tape, but must keep the area dry and clean.  Follow up in 2 weeks at [facility]. Call with any questions or concerns.   Increase activity slowly as tolerated   Complete by: As directed    Weight bearing as tolerated with assist device (walker, cane, etc) as directed, use it as long as suggested by your surgeon or therapist, typically at least 4-6 weeks.   TED hose   Complete by: As directed    Use stockings (TED hose) for 2 weeks on both leg(s).  You may remove them at night for sleeping.      Allergies as of 02/09/2019      Reactions   Gabapentin Swelling   Keppra [levetiracetam] Nausea And Vomiting   Other Rash   Plastic tape: Paper tape ONLY!   Valium [diazepam] Other (See Comments)   Significant depression; "a different person"      Medication List    STOP taking these medications   aspirin EC 81 MG tablet Replaced by: aspirin 81 MG chewable tablet   meloxicam 15 MG tablet Commonly known as: MOBIC     TAKE these medications   acetaminophen 500 MG tablet Commonly known as: TYLENOL Take 2 tablets (1,000 mg total) by mouth every 8 (eight) hours. Notes to patient: Last dose:  02/09/2019 at 1241 pm   aspirin 81 MG chewable tablet Commonly known as:  Aspirin Childrens Chew 1 tablet (81 mg total) by mouth 2 (two) times daily. Take for 4 weeks, then resume regular dose. Replaces: aspirin EC 81 MG tablet   baclofen 20 MG tablet Commonly known as: LIORESAL Take 1 tablet (20 mg total) by mouth 3 (three) times daily. What changed: additional instructions   cetirizine 10 MG tablet Commonly known as: ZYRTEC Take 10 mg by mouth daily. Notes to patient: Take tomorrow, 02/10/2019   docusate sodium 100 MG capsule Commonly known as: Colace Take 1 capsule (100 mg total) by mouth 2 (two) times daily.   ferrous sulfate 325 (65 FE) MG  tablet Commonly known as: FerrouSul Take 1 tablet (325 mg total) by mouth 3 (three) times daily with meals for 14 days.   lisinopril-hydrochlorothiazide 10-12.5 MG tablet Commonly known as: ZESTORETIC Take 1 tablet by mouth daily.   meclizine 25 MG tablet Commonly known as: ANTIVERT Take 25 mg by mouth 3 (three) times daily as needed for dizziness.   Melatonin 10 MG Caps Take 10 mg by mouth at bedtime.   morphine 30 MG 12 hr tablet Commonly known as: MS CONTIN Take 30 mg by mouth every 12 (twelve) hours. Take 8am and 8pm   multivitamin with minerals tablet Take 1 tablet by mouth daily. Spectravite   nitroGLYCERIN 0.4 MG SL tablet Commonly known as: NITROSTAT Place 1 tablet (0.4 mg total) under the tongue every 5 (five) minutes as needed for chest pain.   nystatin cream Commonly known as: MYCOSTATIN Apply 1 application topically daily as needed for dry skin.   polyethylene glycol 17 g packet Commonly known as: MIRALAX / GLYCOLAX Take 17 g by mouth 2 (two) times daily.   pravastatin 80 MG tablet Commonly known as: PRAVACHOL Take 80 mg by mouth daily. Notes to patient: Take tomorrow, 02/10/2019   pregabalin 75 MG capsule Commonly known as: LYRICA TAKE ONE CAPSULE BY MOUTH THREE TIMES DAILY What changed: additional instructions   promethazine 12.5 MG tablet Commonly known as: PHENERGAN Take  2 tablets (25 mg total) by mouth every 6 (six) hours as needed for nausea.   sildenafil 100 MG tablet Commonly known as: VIAGRA Take 100 mg by mouth daily as needed for erectile dysfunction.   traMADol 50 MG tablet Commonly known as: ULTRAM Take 1-2 tablets (50-100 mg total) by mouth every 6 (six) hours as needed for moderate pain or severe pain.   Vitamin D (Ergocalciferol) 1.25 MG (50000 UT) Caps capsule Commonly known as: DRISDOL Take 50,000 Units by mouth 2 (two) times a week.            Discharge Care Instructions  (From admission, onward)         Start     Ordered   02/09/19 0000  Change dressing    Comments: Maintain surgical dressing until follow up in the clinic. If the edges start to pull up, may reinforce with tape. If the dressing is no longer working, may remove and cover with gauze and tape, but must keep the area dry and clean.  Call with any questions or concerns.   02/09/19 0757           Signed: [place] Pugh. Ivar Domangue   PA-C  02/14/2019, 9:52 AM

## 2019-02-16 ENCOUNTER — Ambulatory Visit (HOSPITAL_COMMUNITY)
Admission: RE | Admit: 2019-02-16 | Discharge: 2019-02-16 | Disposition: A | Discharge status: Home / Self Care | Payer: Medicare Other | Source: Ambulatory Visit | Attending: Physician Assistant | Admitting: Physician Assistant

## 2019-02-16 ENCOUNTER — Other Ambulatory Visit: Payer: Self-pay

## 2019-02-16 ENCOUNTER — Other Ambulatory Visit (HOSPITAL_COMMUNITY): Payer: Self-pay | Admitting: Orthopedic Surgery

## 2019-02-16 DIAGNOSIS — M7989 Other specified soft tissue disorders: Secondary | ICD-10-CM | POA: Insufficient documentation

## 2019-02-16 DIAGNOSIS — M79604 Pain in right leg: Secondary | ICD-10-CM

## 2019-02-16 NOTE — Progress Notes (Signed)
Right lower extremity venous duplex completed. Preliminary results I Chary review CV proc. [place] Kaysa Roulhac,RVS 9/1 12/2018 3:00 PM

## 2019-04-12 DIAGNOSIS — M7062 Trochanteric bursitis, left hip: Secondary | ICD-10-CM | POA: Insufficient documentation

## 2019-04-20 ENCOUNTER — Other Ambulatory Visit: Payer: Self-pay | Admitting: Neurosurgery

## 2019-04-20 ENCOUNTER — Other Ambulatory Visit: Payer: Self-pay | Admitting: Hand Surgery

## 2019-04-20 ENCOUNTER — Telehealth: Payer: Self-pay | Admitting: Nurse Practitioner

## 2019-04-20 DIAGNOSIS — M961 Postlaminectomy syndrome, not elsewhere classified: Secondary | ICD-10-CM

## 2019-04-20 NOTE — Telephone Encounter (Signed)
Phone call to patient to verify medication list and allergies for myelogram procedure. Pt instructed to hold tramadol and phenergan for 48hrs prior to myelogram appointment time. Pt verbalized understanding. Pre and post procedure instructions reviewed with pt.

## 2019-05-01 ENCOUNTER — Other Ambulatory Visit: Payer: Medicare Other

## 2019-05-01 ENCOUNTER — Inpatient Hospital Stay: Admission: RE | Admit: 2019-05-01 | Payer: Medicare Other | Source: Ambulatory Visit

## 2019-05-07 ENCOUNTER — Ambulatory Visit
Admission: RE | Admit: 2019-05-07 | Discharge: 2019-05-07 | Disposition: A | Discharge status: Home / Self Care | Payer: PRIVATE HEALTH INSURANCE | Source: Ambulatory Visit | Attending: Neurosurgery | Admitting: Neurosurgery

## 2019-05-07 ENCOUNTER — Ambulatory Visit
Admission: RE | Admit: 2019-05-07 | Discharge: 2019-05-07 | Disposition: A | Discharge status: Home / Self Care | Payer: Medicare Other | Source: Ambulatory Visit | Attending: Neurosurgery | Admitting: Neurosurgery

## 2019-05-07 ENCOUNTER — Other Ambulatory Visit: Payer: Self-pay

## 2019-05-07 DIAGNOSIS — M961 Postlaminectomy syndrome, not elsewhere classified: Secondary | ICD-10-CM

## 2019-05-07 MED ORDER — IOPAMIDOL (ISOVUE-M 200) INJECTION 41%
18.0000 mL | Freq: Once | INTRAMUSCULAR | Status: AC
  Administered 2019-05-07: 11:00:00 18 mL via INTRATHECAL

## 2019-05-07 NOTE — Discharge Instructions (Addendum)
Myelogram Discharge Instructions  1. Go home and rest quietly for the next 24 hours.  It is important to lie flat for the next 24 hours.  Get up only to go to the restroom.  You may lie in the bed or on a couch on your back, your stomach, your left side or your right side.  You may have one pillow under your head.  You may have pillows between your knees while you are on your side or under your knees while you are on your back.  2. DO NOT drive today.  Recline the seat as far back as it will go, while still wearing your seat belt, on the way home.  3. You may get up to go to the bathroom as needed.  You may sit up for 10 minutes to eat.  You may resume your normal diet and medications unless otherwise indicated.  Drink lots of extra fluids today and tomorrow.  4. The incidence of headache, nausea, or vomiting is about 5% (one in 20 patients).  If you develop a headache, lie flat and drink plenty of fluids until the headache goes away.  Caffeinated beverages may be helpful.  If you develop severe nausea and vomiting or a headache that does not go away with flat bed rest, call [phone].  5. You may resume normal activities after your 24 hours of bed rest is over; however, do not exert yourself strongly or do any heavy lifting tomorrow. If when you get up you have a headache when standing, go back to bed and force fluids for another 24 hours.  6. Call your physician for a follow-up appointment.  The results of your myelogram will be sent directly to your physician by the following day.  7. If you have any questions or if complications develop after you arrive home, please call [phone].  Discharge instructions have been explained to the patient.  The patient, or the person responsible for the patient, fully understands these instructions.  YOU MAY RESTART YOUR TRAMADOL AND PHENERGAN TOMORROW 05/08/2019 AT 09:30AM.

## 2019-05-09 ENCOUNTER — Other Ambulatory Visit: Payer: Self-pay | Admitting: Neurosurgery

## 2019-05-09 DIAGNOSIS — G970 Cerebrospinal fluid leak from spinal puncture: Secondary | ICD-10-CM

## 2019-05-10 ENCOUNTER — Ambulatory Visit
Admission: RE | Admit: 2019-05-10 | Discharge: 2019-05-10 | Disposition: A | Discharge status: Home / Self Care | Payer: PRIVATE HEALTH INSURANCE | Source: Ambulatory Visit | Attending: Neurosurgery | Admitting: Neurosurgery

## 2019-05-10 ENCOUNTER — Other Ambulatory Visit: Payer: Self-pay

## 2019-05-10 DIAGNOSIS — G970 Cerebrospinal fluid leak from spinal puncture: Secondary | ICD-10-CM

## 2019-05-10 MED ORDER — IOPAMIDOL (ISOVUE-M 200) INJECTION 41%
1.0000 mL | Freq: Once | INTRAMUSCULAR | Status: AC
  Administered 2019-05-10: 10:00:00 1 mL via EPIDURAL

## 2019-05-10 NOTE — Discharge Instructions (Signed)

## 2019-05-10 NOTE — Progress Notes (Signed)
20cc blood drawn from right AC space by Lavenia Atlas, RN for epidural blood patch; site unremarkable.

## 2019-05-11 ENCOUNTER — Telehealth: Payer: Self-pay | Admitting: Nurse Practitioner

## 2019-05-11 NOTE — Telephone Encounter (Signed)
PC from pt c.o a persistent headache after blood patch yesterday. Pt states headache is worse then yesterday and is still positional and relieved with lying down. Spoke with Dr. Jobe Igo, he recommended pt to lie back down over the weekend, continue drinking fluids, and call back [facility] on Monday if his symptoms persist. Pt verbalized understanding.

## 2019-07-31 ENCOUNTER — Other Ambulatory Visit: Payer: Self-pay | Admitting: Neurosurgery

## 2019-07-31 ENCOUNTER — Other Ambulatory Visit: Payer: Self-pay

## 2019-08-24 ENCOUNTER — Telehealth (HOSPITAL_COMMUNITY): Payer: Self-pay

## 2019-08-24 NOTE — Telephone Encounter (Signed)

## 2019-08-28 ENCOUNTER — Ambulatory Visit (INDEPENDENT_AMBULATORY_CARE_PROVIDER_SITE_OTHER): Payer: Medicare Other | Admitting: Vascular Surgery

## 2019-08-28 ENCOUNTER — Encounter: Payer: Self-pay | Admitting: Vascular Surgery

## 2019-08-28 ENCOUNTER — Other Ambulatory Visit: Payer: Self-pay

## 2019-08-28 VITALS — BP 120/66 | HR 62 | Temp 98.3°F | Resp 18 | Ht 72.0 in | Wt 295.0 lb

## 2019-08-28 DIAGNOSIS — M5416 Radiculopathy, lumbar region: Secondary | ICD-10-CM | POA: Diagnosis not present

## 2019-08-28 NOTE — Progress Notes (Signed)
Patient name: Darin Gomez MRN: [id number] DOB: 27-Oct-1950 Sex: male  REASON FOR CONSULT: Evaluate for L5-S1 ALIF  HPI: Darin Gomez is a 69 y.o. male, with history of hyperlipidemia and chronic back pain that presents for evaluation of planned L5-S1 ALIF with Dr. Vertell Limber.  Patient reports chronic back pain and has had previous posterior surgery in 2012 with bilateral L3-L4-L5 laminectomies and discectomy at L3-L4.  He also went on to have a spinal stimulator placed in 2015.  He reports bilateral lower extremity radiculopathy that is very severe.  Ultimately has been evaluated Dr. Vertell Limber who plans an L5-S1 ALIF with additional L3-L4 and L4-L5 XLIF.  He denies any active tobacco abuse.  He has never had abdominal surgery.  Very debilitated by his back pain.  Past Medical History:  Diagnosis Date  . Allergy   . Arthritis    mainly in back  . Back pain   . Balance disorder   . Blood in urine   . Chest pain   . Constipation   . Diverticulosis 02/14/2018   noted on colonoscopy  . Environmental allergies   . Foot swelling    Bilateral  . GERD (gastroesophageal reflux disease)   . Headache(784.0)    occas.  migraines  (from last surgery)  . Heart murmur    as child....Marland Kitchenno problems now  . High cholesterol   . History of colon polyps 02/14/2018  . Hyperlipidemia   . Hypertension    dx  10 yrs or more  ago  . Insomnia   . Neck pain   . Neuropathy   . Night sweats   . Painful swelling of joint   . PONV (postoperative nausea and vomiting)   . Poor circulation of extremity    legs  . Shortness of breath   . Shortness of breath dyspnea    occasional  . Sinus disorder   . Spinal headache   . Vertigo     Past Surgical History:  Procedure Laterality Date  . BACK SURGERY    . CARDIAC CATHETERIZATION     03/04/14  . COLONOSCOPY    . FRACTURE SURGERY     left arm  . KNEE SURGERY Right 01/02/2018   torn minicus  . LEFT HEART CATHETERIZATION WITH CORONARY ANGIOGRAM N/A  03/04/2014   Procedure: LEFT HEART CATHETERIZATION WITH CORONARY ANGIOGRAM;  Surgeon: Peter M Martinique, MD;  Location: [facility] CATH LAB;  Service: Cardiovascular;  Laterality: N/A;  . LUMBAR LAMINECTOMY/DECOMPRESSION MICRODISCECTOMY  03/15/2012   Procedure: LUMBAR LAMINECTOMY/DECOMPRESSION MICRODISCECTOMY 2 LEVELS;  Surgeon: Floyce Stakes, MD;  Location: [facility];  Service: Neurosurgery;  Laterality: Bilateral;  Bilateral Lumbar three, lumbar four, lumbar five Laminectomy  . POLYPECTOMY    . SPINAL CORD STIMULATOR INSERTION N/A 05/29/2014   Procedure: LUMBAR SPINAL CORD STIMULATOR INSERTION;  Surgeon: Bonna Gains, MD;  Location: [facility] OR[facility];  Service: Neurosurgery;  Laterality: N/A;  . TOTAL KNEE ARTHROPLASTY Right 02/08/2019   Procedure: TOTAL KNEE ARTHROPLASTY;  Surgeon: Paralee Cancel, MD;  Location: [facility] ORS;  Service: Orthopedics;  Laterality: Right;  70 mins    Family History  Problem Relation Age of Onset  . Heart attack Mother   . Hypertension Mother   . Hyperlipidemia Mother   . Diabetes Mother   . Cancer Father   . Heart attack Brother 88  . Breast cancer Neg Hx   . Colon polyps Neg Hx   . Colon cancer Neg Hx   . Esophageal  cancer Neg Hx   . Liver cancer Neg Hx   . Ovarian cancer Neg Hx   . Pancreatic cancer Neg Hx   . Prostate cancer Neg Hx   . Rectal cancer Neg Hx   . Stomach cancer Neg Hx     SOCIAL HISTORY: Social History   Socioeconomic History  . Marital status: Married    Spouse name: Not on file  . Number of children: 3  . Years of education: Not on file  . Highest education level: Some college, no degree  Occupational History  . Not on file  Tobacco Use  . Smoking status: Former Smoker    Years: 45.00    Types: Cigarettes  . Smokeless tobacco: Former Systems developer    Types: Chew  . Tobacco comment: quit smoking cigarettes > 30 years ago  Substance and Sexual Activity  . Alcohol use: Yes    Alcohol/week: 5.0 - 9.0 standard drinks    Types: 3 - 4 Glasses of  wine, 2 Shots of liquor per week    Comment: occasional beer  . Drug use: Never  . Sexual activity: Not Currently  Other Topics Concern  . Not on file  Social History Narrative  . Not on file   Social Determinants of Health   Financial Resource Strain: Low Risk   . Difficulty of Paying Living Expenses: Not very hard  Food Insecurity: Food Insecurity Present  . Worried About Charity fundraiser in the Last Year: Sometimes true  . Ran Out of Food in the Last Year: Sometimes true  Transportation Needs: No Transportation Needs  . Lack of Transportation (Medical): No  . Lack of Transportation (Non-Medical): No  Physical Activity: Insufficiently Active  . Days of Exercise per Week: 2 days  . Minutes of Exercise per Session: 20 min  Stress: Stress Concern Present  . Feeling of Stress : To some extent  Social Connections: Unknown  . Frequency of Communication with Friends and Family: Not on file  . Frequency of Social Gatherings with Friends and Family: Not on file  . Attends Religious Services: 1 to 4 times per year  . Active Member of Clubs or Organizations: No  . Attends Archivist Meetings: Never  . Marital Status: Married  Human resources officer Violence: Not At Risk  . Fear of Current or Ex-Partner: No  . Emotionally Abused: No  . Physically Abused: No  . Sexually Abused: No    Allergies  Allergen Reactions  . Gabapentin Swelling  . Keppra [Levetiracetam] Nausea And Vomiting  . Other Rash    Plastic tape: Paper tape ONLY!   . Valium [Diazepam] Other (See Comments)    Significant depression; "a different person"    Current Outpatient Medications  Medication Sig Dispense Refill  . acetaminophen (TYLENOL) 500 MG tablet Take 2 tablets (1,000 mg total) by mouth every 8 (eight) hours. 30 tablet 0  . baclofen (LIORESAL) 20 MG tablet Take 1 tablet (20 mg total) by mouth 3 (three) times daily. (Patient taking differently: Take 20 mg by mouth 3 (three) times daily. Takes  at [place], 3PM, 8PM) 90 each 4  . cetirizine (ZYRTEC) 10 MG tablet Take 10 mg by mouth daily.     Marland Kitchen docusate sodium (COLACE) 100 MG capsule Take 1 capsule (100 mg total) by mouth 2 (two) times daily. 28 capsule 0  . lisinopril-hydrochlorothiazide (PRINZIDE,ZESTORETIC) 10-12.5 MG per tablet Take 1 tablet by mouth daily.    . meclizine (ANTIVERT) 25 MG tablet Take  25 mg by mouth 3 (three) times daily as needed for dizziness.     . Melatonin 10 MG CAPS Take 10 mg by mouth at bedtime.    Marland Kitchen morphine (MS CONTIN) 30 MG 12 hr tablet Take 30 mg by mouth every 12 (twelve) hours. Take 8am and 8pm    . Multiple Vitamins-Minerals (MULTIVITAMIN WITH MINERALS) tablet Take 1 tablet by mouth daily. Spectravite    . nitroGLYCERIN (NITROSTAT) 0.4 MG SL tablet Place 1 tablet (0.4 mg total) under the tongue every 5 (five) minutes as needed for chest pain. 25 tablet 3  . nystatin cream (MYCOSTATIN) Apply 1 application topically daily as needed for dry skin.     . polyethylene glycol (MIRALAX / GLYCOLAX) 17 g packet Take 17 g by mouth 2 (two) times daily. 28 packet 0  . pravastatin (PRAVACHOL) 80 MG tablet Take 80 mg by mouth daily.    . pregabalin (LYRICA) 75 MG capsule TAKE ONE CAPSULE BY MOUTH THREE TIMES DAILY  (Patient taking differently: Take 75 mg by mouth 3 (three) times daily. Takes at [place], 3PM, 8PM) 90 capsule 0  . promethazine (PHENERGAN) 12.5 MG tablet Take 2 tablets (25 mg total) by mouth every 6 (six) hours as needed for nausea. 30 tablet 1  . sildenafil (VIAGRA) 100 MG tablet Take 100 mg by mouth daily as needed for erectile dysfunction.    . traMADol (ULTRAM) 50 MG tablet Take 1-2 tablets (50-100 mg total) by mouth every 6 (six) hours as needed for moderate pain or severe pain. 40 tablet 0  . Vitamin D, Ergocalciferol, (DRISDOL) 50000 UNITS CAPS capsule Take 50,000 Units by mouth 2 (two) times a week.     . ferrous sulfate (FERROUSUL) 325 (65 FE) MG tablet Take 1 tablet (325 mg total) by mouth 3 (three) times  daily with meals for 14 days. 42 tablet 0   No current facility-administered medications for this visit.    REVIEW OF SYSTEMS:  [X]  denotes positive finding, [ ]  denotes negative finding Cardiac  Comments:  Chest pain or chest pressure:    Shortness of breath upon exertion:    Short of breath when lying flat:    Irregular heart rhythm:        Vascular    Pain in calf, thigh, or hip brought on by ambulation:    Pain in feet at night that wakes you up from your sleep:     Blood clot in your veins:    Leg swelling:         Pulmonary    Oxygen at home:    Productive cough:     Wheezing:         Neurologic    Sudden weakness in arms or legs:     Sudden numbness in arms or legs:     Sudden onset of difficulty speaking or slurred speech:    Temporary loss of vision in one eye:     Problems with dizziness:         Gastrointestinal    Blood in stool:     Vomited blood:         Genitourinary    Burning when urinating:     Blood in urine:        Psychiatric    Major depression:         Hematologic    Bleeding problems:    Problems with blood clotting too easily:        Skin  Rashes or ulcers:        Constitutional    Fever or chills:      PHYSICAL EXAM: Vitals:   08/28/19 1430  BP: 120/66  Pulse: 62  Resp: 18  Temp: 98.3 F (36.8 C)  TempSrc: Temporal  SpO2: 95%  Weight: 295 lb (133.8 kg)  Height: 6' (1.829 m)    GENERAL: The patient is a well-nourished male, in no acute distress. The vital signs are documented above. CARDIAC: There is a regular rate and rhythm.  VASCULAR:  Palpable femoral pulses bilateral Palpable dorsalis pedis pulses bilateral PULMONARY: There is good air exchange bilaterally without wheezing or rales. ABDOMEN: Soft and non-tender. No abdominal scars. MUSCULOSKELETAL: There are no major deformities or cyanosis. NEUROLOGIC: No focal weakness or paresthesias are detected. SKIN: There are no ulcers or rashes noted. PSYCHIATRIC: The  patient has a normal affect.  DATA:   I independently reviewed his CT lumbar spine from 05/07/2019 and on my review his L5-S1 disc space is at the same level of the aortic and iliac vein bifurcation.  I would approach this more from a traditional L4-L5 approach.  Assessment/Plan:  69 year old male with chronic lower back pain with bilateral lower extremity radiculopathy that presents for evaluation of L5-S1 ALIF.  I reviewed his CT lumbar spine from 05/07/2019 and appears to have transitional anatomy and his L5-S1 disc space is basically at the level of aortic and iliac vein bifurcation.  I would likely approach this from a traditional L4-L5 approach by mobilizing both iliac artery and iliac vein across the disc space rather than splaying the vessels like traditional L5-S1 approach.  I discussed steps of surgery in detail with the patient as well as risk and benefits.  Discussed incision over the left rectus with mobilization of the rectus muscle and subsequent mobilization of the intestines and peritoneum as well as the left ureter and iliac artery and vein and possible injury to the above structures.  Look forward to helping Dr. Vertell Limber.   [name], MD [facility] Office: [phone]

## 2019-09-03 NOTE — H&P (Signed)
Patient ID:   [id number] Patient: Darin Gomez  Date of Birth: 1950/06/12 Visit Type: Office Visit   Date: 07/30/2019 11:00 AM Provider: Marchia Meiers. Vertell Limber MD   This 69 year old male presents for back pain.  HISTORY OF PRESENT ILLNESS: 1.  back pain  Patient returns to discuss surgical options.  Scoliosis x-ray and lumbar CT/myelogram on canopy   the patient is complaining of 6/10 pain.   he complains of pain across his low back to his left hip and all the way to his left outer shin.  Says he is worse with standing gets relief when he sits  He is currently 299 lb.   we have spoken about surgical treatment options and based on his spinal imaging I have recommended L5-S1 ALIF with vascular surgical exposure and right-sided XL IF L3-4 and L4-5 levels with percutaneous pedicle screw fixation L3 through S1 levels      Medical/Surgical/Interim History Reviewed, no change.  Last detailed document date:10/06/2017.     Family History: Reviewed, no changes.  Last detailed document date:10/06/2017.   Social History: Reviewed, no changes. Last detailed document date: 10/06/2017.    MEDICATIONS: (added, continued or stopped this visit) Started Medication Directions Instruction Stopped  aspirin 81 mg chewable tablet chew 1 tablet by oral route  every day   06/06/2019 BACLOFEN 20 MG TABLET TAKE 1 TABLET BY MOUTH EVERY 8 HOURS    lisinopril 10 mg-hydrochlorothiazide 12.5 mg tablet take 1 tablet by oral route  every day   06/11/2019 Lyrica 75 mg capsule take 1 Po Qam, 2 Po noon, 2 Po qhs    meclizine 25 mg tablet take 1 tablet by oral route 3 times every day as needed    melatonin 10 mg capsule    02/23/2018 meloxicam 15 mg tablet    07/27/2019 MS Contin 30 mg tablet,extended release take 1 tablet by oral route  every 12 hours (DNF [id number]) Not a duplicate, do not delete  07/27/2019 MS Contin 30 mg tablet,extended release take 1 tablet by oral  route  every 12 hours (DNF [id number]) Not a duplicate, do not delete  07/27/2019 MS Contin 30 mg tablet,extended release take 1 tablet by oral route  every 12 hours (DNF [id number]) Not a duplicate, do not delete   multivitamin with iron     oxycodone 15 mg tablet take 1 tablet by oral route  every 8 hours   06/20/2019 oxycodone 15 mg tablet take 1 tablet by oral route  every 8 hours    pravastatin 80 mg tablet take 1 tablet by oral route  every day    Stool Softener 100 mg capsule take 1 capsule by oral route  every day at bedtime as needed    Vitamin D2 50,000 unit capsule take 1 capsule by oral route  every week    Zyrtec 10 mg tablet take 1 tablet by oral route  every day      ALLERGIES: Ingredient Reaction Medication Name Comment DIAZEPAM  Valium  LEVETIRACETAM N&V Keppra   Reviewed, no changes.    PHYSICAL EXAM:  Vitals Date Temp F BP Pulse Ht In Wt Lb BMI BSA Pain Score 07/30/2019 96.9 156/79 69 73 299 39.45  6/10     IMPRESSION:   the patient has severe pain.  He is very limited in terms of activity and has severe foraminal stenosis.  I have recommended proceeding with surgery given the severity and unrelenting nature his pain  PLAN:  proceed with a LIF  L5-S1 and XL IF L3-4 L4-5 with percutaneous pedicle screw fixation L3 through S1 levels risks and benefits were discussed patient surgery a prescription was given for a lumbosacral orthosis  Orders: Office Procedures/Services: Assessment Service Comments  Vascular surgery evaluation preop   Diagnostic Procedures: Assessment Procedure M54.16 Lumbar Spine- AP/Lat Instruction(s)/Education: Assessment Instruction I10 Lifestyle education Z68.39 Dietary management education, guidance, and counseling Miscellaneous: Assessment  M48.061 LSO Brace  Completed Orders (this encounter) Order Details Reason Side Interpretation Result Initial Treatment  Date Region Lifestyle education Patient will follow up with Primary Care Physician.       Dietary management education, guidance, and counseling Encouraged patient to eat well balanced diet.        Assessment/Plan  # Detail Type Description  1. Assessment Low back pain, unspecified back pain laterality, with sciatica presence unspecified (M54.5).     2. Assessment Lumbar post-laminectomy syndrome (M96.1).     3. Assessment Spondylolisthesis, lumbar region (M43.16).     4. Assessment Idiopathic scoliosis of lumbar region (M41.26).     5. Assessment Degenerative lumbar spinal stenosis (M48.061).  Plan Orders LSO Brace.     6. Assessment Lumbar radiculopathy (M54.16).     7. Assessment Essential (primary) hypertension (I10).     8. Assessment Body mass index (BMI) 39.0-39.9, adult (Z68.39).  Plan Orders Today's instructions / counseling include(s) Dietary management education, guidance, and counseling. Clinical information/comments: Encouraged patient to eat well balanced diet.     9. Other Orders Orders not associated to today's assessments.  Plan Orders Vascular surgery evaluation preop.    Pain Management Plan Pain Scale: 6/10. Method: Numeric Pain Intensity Scale. Location: back. Onset: 11/10/2010. Duration: varies. Quality: discomforting. Pain management follow-up plan of care: Patient taking medications as prescribed.              Provider:  Marchia Meiers. Vertell Limber MD  08/05/2019 04:47 PM    Dictation edited by: Marchia Meiers. Vertell Limber    CC Providers: Cyndi Bender [address] [place],  [place]  [address]   Cyndi Bender  [address] [place], [place] [address]               Electronically signed by Marchia Meiers. Vertell Limber MD on 08/05/2019 04:47 PM

## 2019-09-10 NOTE — Pre-Procedure Instructions (Signed)
KAYGE BOURGEOIS  09/10/2019      [facility], [place] [address] [place] [address] Phone: [phone] Fax: [phone]    Your procedure is scheduled on 09/14/19.  Report to [facility] Admitting at 530 A.M.      Call this number if you have problems the morning of surgery:  [phone]   Remember:  Do not eat or drink after midnight.     Take these medicines the morning of surgery with A SIP OF WATER ----TYLENOL,BACOLFEN,ZYRTEC,MS CONTIN,LYRICA,ULTRAM  Do not take any aspirin,anti-inflammatories,vitamins,or herbal supplements 5-7 days prior to surgery.  Do not wear jewelry, make-up or nail polish.  Do not wear lotions, powders, or perfumes, or deodorant.  Do not shave 48 hours prior to surgery.  Men may shave face and neck.  Do not bring valuables to the hospital.  [facility] is not responsible for any belongings or valuables.  Contacts, dentures or bridgework may not be worn into surgery.  Leave your suitcase in the car.  After surgery it may be brought to your room.  For patients admitted to the hospital, discharge time will be determined by your treatment team.  Patients discharged the day of surgery will not be allowed to drive home.    Special instructions: [facility] - Preparing for Surgery  Before surgery, you can play an important role.  Because skin is not sterile, your skin needs to be as free of germs as possible.  You can reduce the number of germs on you skin by washing with CHG (chlorahexidine gluconate) soap before surgery.  CHG is an antiseptic cleaner which kills germs and bonds with the skin to continue killing germs even after washing.  Oral Hygiene is also important in reducing the risk of infection.  Remember to brush your teeth with your regular toothpaste the morning of surgery.  Please DO NOT use if you have an allergy to CHG or antibacterial soaps.  If your skin becomes  reddened/irritated stop using the CHG and inform your nurse when you arrive at Short Stay.  Do not shave (including legs and underarms) for at least 48 hours prior to the first CHG shower.  You may shave your face.  Please follow these instructions carefully:   1.  Shower with CHG Soap the night before surgery and the morning of Surgery.  2.  If you choose to wash your hair, wash your hair first as usual with your normal shampoo.  3.  After you shampoo, rinse your hair and body thoroughly to remove the shampoo. 4.  Use CHG as you would any other liquid soap.  You can apply chg directly to the skin and wash gently with a      scrungie or washcloth.           5.  Apply the CHG Soap to your body ONLY FROM THE NECK DOWN.   Do not use on open wounds or open sores. Avoid contact with your eyes, ears, mouth and genitals (private parts).  Wash genitals (private parts) with your normal soap.  6.  Wash thoroughly, paying special attention to the area where your surgery will be performed.  7.  Thoroughly rinse your body with warm water from the neck down.  8.  DO NOT shower/wash with your normal soap after using and rinsing off the CHG Soap.  9.  Pat yourself dry with a clean towel.  10.  Wear clean pajamas.            11.  Place clean sheets on your bed the night of your first shower and do not sleep with pets.  Day of Surgery  Do not apply any lotions/deoderants the morning of surgery.   Please wear clean clothes to the hospital/surgery center. Remember to brush your teeth with toothpaste.    Please read over the following fact sheets that you were given. MRSA Information

## 2019-09-11 ENCOUNTER — Other Ambulatory Visit (HOSPITAL_COMMUNITY)
Admission: RE | Admit: 2019-09-11 | Discharge: 2019-09-11 | Disposition: A | Discharge status: Home / Self Care | Payer: Medicare Other | Source: Ambulatory Visit | Attending: Neurosurgery | Admitting: Neurosurgery

## 2019-09-11 ENCOUNTER — Other Ambulatory Visit: Payer: Self-pay

## 2019-09-11 ENCOUNTER — Encounter (HOSPITAL_COMMUNITY)
Admission: RE | Admit: 2019-09-11 | Discharge: 2019-09-11 | Disposition: A | Discharge status: Home / Self Care | Payer: Medicare Other | Source: Ambulatory Visit | Attending: Neurosurgery | Admitting: Neurosurgery

## 2019-09-11 ENCOUNTER — Encounter (HOSPITAL_COMMUNITY): Payer: Self-pay

## 2019-09-11 DIAGNOSIS — Z20822 Contact with and (suspected) exposure to covid-19: Secondary | ICD-10-CM | POA: Insufficient documentation

## 2019-09-11 DIAGNOSIS — Z01812 Encounter for preprocedural laboratory examination: Secondary | ICD-10-CM | POA: Insufficient documentation

## 2019-09-11 LAB — CBC
HCT: 48.3 % (ref 39.0–52.0)
Hemoglobin: 15.2 g/dL (ref 13.0–17.0)
MCH: 30 pg (ref 26.0–34.0)
MCHC: 31.5 g/dL (ref 30.0–36.0)
MCV: 95.3 fL (ref 80.0–100.0)
Platelets: 280 10*3/uL (ref 150–400)
RBC: 5.07 MIL/uL (ref 4.22–5.81)
RDW: 13.3 % (ref 11.5–15.5)
WBC: 7.5 10*3/uL (ref 4.0–10.5)
nRBC: 0 % (ref 0.0–0.2)

## 2019-09-11 LAB — BASIC METABOLIC PANEL
Anion gap: 10 (ref 5–15)
BUN: 7 mg/dL — ABNORMAL LOW (ref 8–23)
CO2: 26 mmol/L (ref 22–32)
Calcium: 8.5 mg/dL — ABNORMAL LOW (ref 8.9–10.3)
Chloride: 103 mmol/L (ref 98–111)
Creatinine, Ser: 0.88 mg/dL (ref 0.61–1.24)
GFR calc Af Amer: >60 mL/min (ref >60)
GFR calc non Af Amer: >60 mL/min (ref >60)
Glucose, Bld: 106 mg/dL — ABNORMAL HIGH (ref 70–99)
Potassium: 3.9 mmol/L (ref 3.5–5.1)
Sodium: 139 mmol/L (ref 135–145)

## 2019-09-11 LAB — TYPE AND SCREEN
ABO/RH(D): A POS
Antibody Screen: NEGATIVE

## 2019-09-11 LAB — ABO/RH: ABO/RH(D): A POS

## 2019-09-11 LAB — SARS CORONAVIRUS 2 (TAT 6-24 HRS): SARS Coronavirus 2: NEGATIVE

## 2019-09-11 LAB — SURGICAL PCR SCREEN
MRSA, PCR: POSITIVE — AB
Staphylococcus aureus: POSITIVE — AB

## 2019-09-11 NOTE — Progress Notes (Signed)
   09/11/19 1136  OBSTRUCTIVE SLEEP APNEA  Have you ever been diagnosed with sleep apnea through a sleep study? No  Do you snore loudly (loud enough to be heard through closed doors)?  1  Do you often feel tired, fatigued, or sleepy during the daytime (such as falling asleep during driving or talking to someone)? 0  Has anyone observed you stop breathing during your sleep? 1  Do you have, or are you being treated for high blood pressure? 1  BMI more than 35 kg/m2? 1  Age > 50 (1-yes) 1  Neck circumference greater than:Male 16 inches or larger, Male 17inches or larger? 1  Male Gender (Yes=1) 1  Obstructive Sleep Apnea Score 7  Score 5 or greater  Results sent to PCP

## 2019-09-11 NOTE — Progress Notes (Addendum)
PCP - Cyndi Bender, PA-C Cardiologist - denies  PPM/ICD - denies  Chest x-ray - N/A EKG - 02/01/2019 Stress Test - 2015 (media tab) ECHO - denies Cardiac Cath - 2015 (media tab)  Sleep Study - denies  Blood Thinner Instructions: N/A Aspirin Instructions: Last dose 09/09/2019  ERAS Protcol - No  COVID TEST- Scheduled for today 09/11/2019 after PAT appointment. Patient verbalized understanding of self-quarantine instructions, appointment time and place.  Anesthesia review: YES, cardiac hx/studies, spinal cord stimulator  Patient denies shortness of breath, fever, cough and chest pain at PAT appointment  All instructions explained to the patient, with a verbal understanding of the material. Patient agrees to go over the instructions while at home for a better understanding. Patient also instructed to self quarantine after being tested for COVID-19. The opportunity to ask questions was provided.

## 2019-09-11 NOTE — Progress Notes (Addendum)
Abnormal labs resulted/surgical PCR. Dr. Vertell Limber notified via [place].

## 2019-09-11 NOTE — Progress Notes (Signed)
[place], [place] [address] [place] [address] Phone: [phone] Fax: [phone]    Your procedure is scheduled on Friday, April 16th.  Report to [facility] Main Entrance "A" at 5:30 A.M., and check in at the Admitting office.  Call this number if you have problems the morning of surgery:  [phone]  Call [phone] if you have any questions prior to your surgery date Monday-Friday 8am-4pm   Remember:  Do not eat or drink after midnight the night before your surgery    Take these medicines the morning of surgery with A SIP OF WATER baclofen (LIORESAL)  cetirizine (ZYRTEC)  docusate sodium (COLACE) morphine (MS CONTIN)  pravastatin (PRAVACHOL) pregabalin (LYRICA)   If needed - acetaminophen (TYLENOL), meclizine (ANTIVERT), nitroGLYCERIN (NITROSTAT), promethazine (PHENERGAN), traMADol (ULTRAM)     As of today, STOP taking any Aspirin (unless otherwise instructed by your surgeon) and Aspirin containing products, Aleve, Naproxen, Ibuprofen, Motrin, Advil, Goody's, BC's, all herbal medications, fish oil, and all vitamins.                     Do not wear jewelry.            Do not wear lotions, powders, colognes, or deodorant.            Men may shave neck and face.            Do not bring valuables to the hospital.            [facility] is not responsible for any belongings or valuables.  Do NOT Smoke (Tobacco/Vapping) or drink Alcohol 24 hours prior to your procedure If you use a CPAP at night, you may bring all equipment for your overnight stay.   Contacts, glasses, dentures or bridgework may not be worn into surgery.      For patients admitted to the hospital, discharge time will be determined by your treatment team.   Patients discharged the day of surgery will not be allowed to drive home, and someone needs to stay with them for 24 hours.  Special instructions:   [facility]- Preparing For  Surgery  Before surgery, you can play an important role. Because skin is not sterile, your skin needs to be as free of germs as possible. You can reduce the number of germs on your skin by washing with CHG (chlorahexidine gluconate) Soap before surgery.  CHG is an antiseptic cleaner which kills germs and bonds with the skin to continue killing germs even after washing.    Oral Hygiene is also important to reduce your risk of infection.  Remember - BRUSH YOUR TEETH THE MORNING OF SURGERY WITH YOUR REGULAR TOOTHPASTE  Please do not use if you have an allergy to CHG or antibacterial soaps. If your skin becomes reddened/irritated stop using the CHG.  Do not shave (including legs and underarms) for at least 48 hours prior to first CHG shower. It is OK to shave your face.  Please follow these instructions carefully.   1. Shower the NIGHT BEFORE SURGERY and the MORNING OF SURGERY with CHG Soap.   2. If you chose to wash your hair, wash your hair first as usual with your normal shampoo.  3. After you shampoo, rinse your hair and body thoroughly to remove the shampoo.  4. Use CHG as you would any other liquid soap. You can apply CHG directly to the skin and wash gently with  a scrungie or a clean washcloth.   5. Apply the CHG Soap to your body ONLY FROM THE NECK DOWN.  Do not use on open wounds or open sores. Avoid contact with your eyes, ears, mouth and genitals (private parts). Wash Face and genitals (private parts)  with your normal soap.   6. Wash thoroughly, paying special attention to the area where your surgery will be performed.  7. Thoroughly rinse your body with warm water from the neck down.  8. DO NOT shower/wash with your normal soap after using and rinsing off the CHG Soap.  9. Pat yourself dry with a CLEAN TOWEL.  10. Wear CLEAN PAJAMAS to bed the night before surgery, wear comfortable clothes the morning of surgery  11. Place CLEAN SHEETS on your bed the night of your first  shower and DO NOT SLEEP WITH PETS.  Day of Surgery:  Do not apply any deodorants/lotions.  Please wear clean clothes to the hospital/surgery center.   Remember to brush your teeth WITH YOUR REGULAR TOOTHPASTE.   Please read over the following fact sheets that you were given.

## 2019-09-12 NOTE — Anesthesia Preprocedure Evaluation (Addendum)
Anesthesia Evaluation  Patient identified by MRN, date of birth, ID band Patient awake    Reviewed: Allergy & Precautions, NPO status , Patient's Chart, lab work & pertinent test results  History of Anesthesia Complications (+) PONV  Airway Mallampati: II  TM Distance: >3 FB Neck ROM: Full    Dental  (+) Dental Advisory Given   Pulmonary former smoker,    breath sounds clear to auscultation       Cardiovascular hypertension, Pt. on medications  Rhythm:Regular Rate:Normal     Neuro/Psych  Neuromuscular disease    GI/Hepatic Neg liver ROS, GERD  ,  Endo/Other  Morbid obesity  Renal/GU negative Renal ROS     Musculoskeletal  (+) Arthritis ,   Abdominal   Peds  Hematology negative hematology ROS (+)   Anesthesia Other Findings   Reproductive/Obstetrics                            Lab Results  Component Value Date   WBC 7.5 09/11/2019   HGB 15.2 09/11/2019   HCT 48.3 09/11/2019   MCV 95.3 09/11/2019   PLT 280 09/11/2019   Lab Results  Component Value Date   CREATININE 0.88 09/11/2019   BUN 7 (L) 09/11/2019   NA 139 09/11/2019   K 3.9 09/11/2019   CL 103 09/11/2019   CO2 26 09/11/2019    Anesthesia Physical Anesthesia Plan  ASA: II  Anesthesia Plan: General   Post-op Pain Management:    Induction: Intravenous  PONV Risk Score and Plan: 4 or greater and Dexamethasone, Ondansetron, Treatment may vary due to age or medical condition, Midazolam and Scopolamine patch - Pre-op  Airway Management Planned: Oral ETT  Additional Equipment: None  Intra-op Plan:   Post-operative Plan: Extubation in OR  Informed Consent: I have reviewed the patients History and Physical, chart, labs and discussed the procedure including the risks, benefits and alternatives for the proposed anesthesia with the patient or authorized representative who has indicated his/her understanding and  acceptance.     Dental advisory given  Plan Discussed with: CRNA  Anesthesia Plan Comments: (Pt evaluated by cardiology in 2015 for chest discomfort and underwent thorough workup and ultimately had cath that was negative for any significant CAD. Per last note by Dr. Acie Fredrickson 04/01/14, "Wolf is doing well. No further episodes of CP. His cath showed no significant CAD. CT angio of the lungs was negative for PE. We will see him back on an as needed basis."  Has spinal cord stimulator in place.   Preop labs reviewed, unremarkable.   EKG 02/01/19: Sinus brady. Rate 56.  Cath 04/04/2014: Final Conclusions:   1. No significant CAD 2. Normal LV function.)      Anesthesia Quick Evaluation

## 2019-09-12 NOTE — Progress Notes (Signed)
Anesthesia Chart Review:  Pt evaluated by cardiology in 2015 for chest discomfort and underwent thorough workup and ultimately had cath that was negative for any significant CAD. Per last note by Dr. Acie Fredrickson 04/01/14, "Darin Gomez is doing well. No further episodes of CP. His cath showed no significant CAD. CT angio of the lungs was negative for PE. We will see him back on an as needed basis."  Has spinal cord stimulator in place.   Preop labs reviewed, unremarkable.   EKG 02/01/19: Sinus brady. Rate 56.  Cath 04/04/2014: Final Conclusions:   1. No significant CAD 2. Normal LV function.   [name] [facility] Short Stay Center/Anesthesiology Phone [phone] 09/12/2019 10:45 AM

## 2019-09-13 MED ORDER — VANCOMYCIN HCL 1500 MG/300ML IV SOLN
1500.0000 mg | INTRAVENOUS | Status: AC
  Administered 2019-09-14: 1500 mg via INTRAVENOUS
  Filled 2019-09-13: qty 300

## 2019-09-13 MED ORDER — DEXTROSE 5 % IV SOLN
3.0000 g | INTRAVENOUS | Status: AC
  Administered 2019-09-14 (×2): 3 g via INTRAVENOUS
  Filled 2019-09-13: qty 3

## 2019-09-14 ENCOUNTER — Other Ambulatory Visit: Payer: Self-pay

## 2019-09-14 ENCOUNTER — Inpatient Hospital Stay (HOSPITAL_COMMUNITY): Payer: Medicare Other | Admitting: Anesthesiology

## 2019-09-14 ENCOUNTER — Inpatient Hospital Stay (HOSPITAL_COMMUNITY)
Admission: RE | Admit: 2019-09-14 | Discharge: 2019-09-23 | DRG: 454 | Disposition: A | Discharge status: Home Health | Payer: Medicare Other | Attending: Neurosurgery | Admitting: Neurosurgery

## 2019-09-14 ENCOUNTER — Inpatient Hospital Stay (HOSPITAL_COMMUNITY): Payer: Medicare Other

## 2019-09-14 ENCOUNTER — Encounter (HOSPITAL_COMMUNITY): Payer: Self-pay | Admitting: Neurosurgery

## 2019-09-14 ENCOUNTER — Encounter (HOSPITAL_COMMUNITY)
Admission: RE | Disposition: A | Discharge status: Home / Self Care | Payer: Self-pay | Source: Home / Self Care | Attending: Neurosurgery

## 2019-09-14 ENCOUNTER — Inpatient Hospital Stay (HOSPITAL_COMMUNITY): Payer: Medicare Other | Admitting: Physician Assistant

## 2019-09-14 DIAGNOSIS — Z79899 Other long term (current) drug therapy: Secondary | ICD-10-CM | POA: Diagnosis not present

## 2019-09-14 DIAGNOSIS — M5416 Radiculopathy, lumbar region: Secondary | ICD-10-CM | POA: Diagnosis present

## 2019-09-14 DIAGNOSIS — Z791 Long term (current) use of non-steroidal anti-inflammatories (NSAID): Secondary | ICD-10-CM

## 2019-09-14 DIAGNOSIS — G8929 Other chronic pain: Secondary | ICD-10-CM | POA: Diagnosis present

## 2019-09-14 DIAGNOSIS — K567 Ileus, unspecified: Secondary | ICD-10-CM | POA: Diagnosis not present

## 2019-09-14 DIAGNOSIS — Z87891 Personal history of nicotine dependence: Secondary | ICD-10-CM

## 2019-09-14 DIAGNOSIS — M4126 Other idiopathic scoliosis, lumbar region: Secondary | ICD-10-CM | POA: Diagnosis present

## 2019-09-14 DIAGNOSIS — K5903 Drug induced constipation: Secondary | ICD-10-CM | POA: Diagnosis present

## 2019-09-14 DIAGNOSIS — Z888 Allergy status to other drugs, medicaments and biological substances status: Secondary | ICD-10-CM

## 2019-09-14 DIAGNOSIS — Z419 Encounter for procedure for purposes other than remedying health state, unspecified: Secondary | ICD-10-CM

## 2019-09-14 DIAGNOSIS — M62838 Other muscle spasm: Secondary | ICD-10-CM | POA: Diagnosis present

## 2019-09-14 DIAGNOSIS — E785 Hyperlipidemia, unspecified: Secondary | ICD-10-CM | POA: Diagnosis present

## 2019-09-14 DIAGNOSIS — M48061 Spinal stenosis, lumbar region without neurogenic claudication: Principal | ICD-10-CM | POA: Diagnosis present

## 2019-09-14 DIAGNOSIS — I10 Essential (primary) hypertension: Secondary | ICD-10-CM | POA: Diagnosis present

## 2019-09-14 DIAGNOSIS — Z20822 Contact with and (suspected) exposure to covid-19: Secondary | ICD-10-CM | POA: Diagnosis present

## 2019-09-14 DIAGNOSIS — R140 Abdominal distension (gaseous): Secondary | ICD-10-CM

## 2019-09-14 DIAGNOSIS — L03311 Cellulitis of abdominal wall: Secondary | ICD-10-CM | POA: Diagnosis not present

## 2019-09-14 DIAGNOSIS — K219 Gastro-esophageal reflux disease without esophagitis: Secondary | ICD-10-CM | POA: Diagnosis present

## 2019-09-14 DIAGNOSIS — M4316 Spondylolisthesis, lumbar region: Secondary | ICD-10-CM | POA: Diagnosis present

## 2019-09-14 DIAGNOSIS — M4726 Other spondylosis with radiculopathy, lumbar region: Secondary | ICD-10-CM

## 2019-09-14 HISTORY — PX: ANTERIOR LAT LUMBAR FUSION: SHX1168

## 2019-09-14 HISTORY — PX: ANTERIOR LUMBAR FUSION: SHX1170

## 2019-09-14 HISTORY — PX: LUMBAR PERCUTANEOUS PEDICLE SCREW 3 LEVEL: SHX5562

## 2019-09-14 HISTORY — PX: ABDOMINAL EXPOSURE: SHX5708

## 2019-09-14 SURGERY — ANTERIOR LUMBAR FUSION 1 LEVEL
Anesthesia: General | Laterality: Right

## 2019-09-14 MED ORDER — LABETALOL HCL 5 MG/ML IV SOLN
INTRAVENOUS | Status: AC
  Filled 2019-09-14: qty 4

## 2019-09-14 MED ORDER — THROMBIN 5000 UNITS EX SOLR
OROMUCOSAL | Status: DC | PRN
  Administered 2019-09-14 (×2): 5 mL via TOPICAL

## 2019-09-14 MED ORDER — MORPHINE SULFATE ER 15 MG PO TBCR
30.0000 mg | EXTENDED_RELEASE_TABLET | Freq: Two times a day (BID) | ORAL | Status: DC
  Administered 2019-09-14 – 2019-09-18 (×8): 30 mg via ORAL
  Filled 2019-09-14 (×8): qty 2

## 2019-09-14 MED ORDER — MECLIZINE HCL 25 MG PO TABS
25.0000 mg | ORAL_TABLET | Freq: Three times a day (TID) | ORAL | Status: DC | PRN
  Administered 2019-09-18 – 2019-09-19 (×4): 25 mg via ORAL
  Filled 2019-09-14 (×4): qty 1

## 2019-09-14 MED ORDER — LORATADINE 10 MG PO TABS
10.0000 mg | ORAL_TABLET | Freq: Every day | ORAL | Status: DC
  Administered 2019-09-16 – 2019-09-18 (×3): 10 mg via ORAL
  Filled 2019-09-14 (×4): qty 1

## 2019-09-14 MED ORDER — EPHEDRINE 5 MG/ML INJ
INTRAVENOUS | Status: AC
  Filled 2019-09-14: qty 10

## 2019-09-14 MED ORDER — LIDOCAINE-EPINEPHRINE 1 %-1:100000 IJ SOLN
INTRAMUSCULAR | Status: AC
  Filled 2019-09-14: qty 1

## 2019-09-14 MED ORDER — LISINOPRIL 10 MG PO TABS
10.0000 mg | ORAL_TABLET | Freq: Every day | ORAL | Status: DC
  Administered 2019-09-14 – 2019-09-18 (×4): 10 mg via ORAL
  Filled 2019-09-14 (×5): qty 1

## 2019-09-14 MED ORDER — 0.9 % SODIUM CHLORIDE (POUR BTL) OPTIME
TOPICAL | Status: DC | PRN
  Administered 2019-09-14: 1000 mL

## 2019-09-14 MED ORDER — ROCURONIUM BROMIDE 10 MG/ML (PF) SYRINGE
PREFILLED_SYRINGE | INTRAVENOUS | Status: DC | PRN
  Administered 2019-09-14: 50 mg via INTRAVENOUS

## 2019-09-14 MED ORDER — HYDROMORPHONE HCL 1 MG/ML IJ SOLN
INTRAMUSCULAR | Status: AC
  Filled 2019-09-14: qty 1

## 2019-09-14 MED ORDER — PROMETHAZINE HCL 25 MG/ML IJ SOLN: 6.2500 mg | INTRAMUSCULAR | Status: DC | PRN

## 2019-09-14 MED ORDER — ALBUMIN HUMAN 5 % IV SOLN: INTRAVENOUS | Status: DC | PRN

## 2019-09-14 MED ORDER — HYDRALAZINE HCL 20 MG/ML IJ SOLN
10.0000 mg | INTRAMUSCULAR | Status: AC | PRN
  Administered 2019-09-14 (×2): 10 mg via INTRAVENOUS

## 2019-09-14 MED ORDER — PROSIGHT PO TABS
1.0000 | ORAL_TABLET | Freq: Every day | ORAL | Status: DC
  Administered 2019-09-16 – 2019-09-18 (×3): 1 via ORAL
  Filled 2019-09-14 (×4): qty 1

## 2019-09-14 MED ORDER — CHLORHEXIDINE GLUCONATE 4 % EX LIQD: 60.0000 mL | Freq: Once | CUTANEOUS | Status: DC

## 2019-09-14 MED ORDER — METHOCARBAMOL 500 MG PO TABS
500.0000 mg | ORAL_TABLET | Freq: Four times a day (QID) | ORAL | Status: DC | PRN
  Administered 2019-09-16 – 2019-09-23 (×19): 500 mg via ORAL
  Filled 2019-09-14 (×21): qty 1

## 2019-09-14 MED ORDER — ONDANSETRON HCL 4 MG/2ML IJ SOLN
INTRAMUSCULAR | Status: AC
  Filled 2019-09-14: qty 2

## 2019-09-14 MED ORDER — PREGABALIN 75 MG PO CAPS
75.0000 mg | ORAL_CAPSULE | Freq: Every morning | ORAL | Status: DC
  Administered 2019-09-15 – 2019-09-18 (×4): 75 mg via ORAL
  Filled 2019-09-14 (×4): qty 1

## 2019-09-14 MED ORDER — KETAMINE HCL 10 MG/ML IJ SOLN
INTRAMUSCULAR | Status: DC | PRN
  Administered 2019-09-14: 10 mg via INTRAVENOUS
  Administered 2019-09-14 (×2): 20 mg via INTRAVENOUS
  Administered 2019-09-14: 10 mg via INTRAVENOUS
  Administered 2019-09-14: 50 mg via INTRAVENOUS
  Administered 2019-09-14 (×4): 10 mg via INTRAVENOUS

## 2019-09-14 MED ORDER — NYSTATIN 100000 UNIT/GM EX CREA
1.0000 "application " | TOPICAL_CREAM | Freq: Every day | CUTANEOUS | Status: DC | PRN
  Filled 2019-09-14: qty 15

## 2019-09-14 MED ORDER — PREGABALIN 75 MG PO CAPS: 75.0000 mg | ORAL_CAPSULE | ORAL | Status: DC

## 2019-09-14 MED ORDER — ALUM & MAG HYDROXIDE-SIMETH 200-200-20 MG/5ML PO SUSP
30.0000 mL | Freq: Four times a day (QID) | ORAL | Status: DC | PRN
  Administered 2019-09-15 – 2019-09-16 (×2): 30 mL via ORAL
  Filled 2019-09-14 (×4): qty 30

## 2019-09-14 MED ORDER — HYDROCHLOROTHIAZIDE 12.5 MG PO CAPS
12.5000 mg | ORAL_CAPSULE | Freq: Every day | ORAL | Status: DC
  Administered 2019-09-14 – 2019-09-18 (×4): 12.5 mg via ORAL
  Filled 2019-09-14 (×5): qty 1

## 2019-09-14 MED ORDER — PANTOPRAZOLE SODIUM 40 MG IV SOLR
40.0000 mg | Freq: Every day | INTRAVENOUS | Status: DC
  Administered 2019-09-14 – 2019-09-17 (×4): 40 mg via INTRAVENOUS
  Filled 2019-09-14 (×4): qty 40

## 2019-09-14 MED ORDER — DOCUSATE SODIUM 100 MG PO CAPS
100.0000 mg | ORAL_CAPSULE | Freq: Two times a day (BID) | ORAL | Status: DC
  Administered 2019-09-14 – 2019-09-23 (×16): 100 mg via ORAL
  Filled 2019-09-14 (×17): qty 1

## 2019-09-14 MED ORDER — PROPOFOL 1000 MG/100ML IV EMUL
INTRAVENOUS | Status: AC
  Filled 2019-09-14: qty 100

## 2019-09-14 MED ORDER — PROMETHAZINE HCL 25 MG PO TABS
25.0000 mg | ORAL_TABLET | Freq: Four times a day (QID) | ORAL | Status: DC | PRN
  Administered 2019-09-15 – 2019-09-18 (×3): 25 mg via ORAL
  Filled 2019-09-14 (×3): qty 1

## 2019-09-14 MED ORDER — LACTATED RINGERS IV SOLN: INTRAVENOUS | Status: DC | PRN

## 2019-09-14 MED ORDER — SODIUM CHLORIDE 0.9 % IV SOLN: 250.0000 mL | INTRAVENOUS | Status: DC

## 2019-09-14 MED ORDER — ONDANSETRON HCL 4 MG/2ML IJ SOLN
4.0000 mg | Freq: Four times a day (QID) | INTRAMUSCULAR | Status: DC | PRN
  Administered 2019-09-14 – 2019-09-18 (×6): 4 mg via INTRAVENOUS
  Filled 2019-09-14 (×7): qty 2

## 2019-09-14 MED ORDER — MIDAZOLAM HCL 5 MG/5ML IJ SOLN
INTRAMUSCULAR | Status: DC | PRN
  Administered 2019-09-14: 2 mg via INTRAVENOUS

## 2019-09-14 MED ORDER — FENTANYL CITRATE (PF) 250 MCG/5ML IJ SOLN
INTRAMUSCULAR | Status: AC
  Filled 2019-09-14: qty 5

## 2019-09-14 MED ORDER — DEXAMETHASONE SODIUM PHOSPHATE 10 MG/ML IJ SOLN
INTRAMUSCULAR | Status: AC
  Filled 2019-09-14: qty 1

## 2019-09-14 MED ORDER — MIDAZOLAM HCL 2 MG/2ML IJ SOLN
INTRAMUSCULAR | Status: AC
  Filled 2019-09-14: qty 4

## 2019-09-14 MED ORDER — LISINOPRIL 10 MG PO TABS: 10.0000 mg | ORAL_TABLET | Freq: Every day | ORAL | Status: DC

## 2019-09-14 MED ORDER — KETAMINE HCL 50 MG/5ML IJ SOSY
PREFILLED_SYRINGE | INTRAMUSCULAR | Status: AC
  Filled 2019-09-14: qty 5

## 2019-09-14 MED ORDER — CHLORHEXIDINE GLUCONATE CLOTH 2 % EX PADS: 6.0000 | MEDICATED_PAD | Freq: Once | CUTANEOUS | Status: DC

## 2019-09-14 MED ORDER — SODIUM CHLORIDE 0.9% FLUSH
3.0000 mL | Freq: Two times a day (BID) | INTRAVENOUS | Status: DC
  Administered 2019-09-14 – 2019-09-23 (×17): 3 mL via INTRAVENOUS

## 2019-09-14 MED ORDER — SUCCINYLCHOLINE CHLORIDE 200 MG/10ML IV SOSY
PREFILLED_SYRINGE | INTRAVENOUS | Status: DC | PRN
  Administered 2019-09-14: 140 mg via INTRAVENOUS

## 2019-09-14 MED ORDER — ZOLPIDEM TARTRATE 5 MG PO TABS
5.0000 mg | ORAL_TABLET | Freq: Every evening | ORAL | Status: DC | PRN
  Administered 2019-09-18 – 2019-09-23 (×5): 5 mg via ORAL
  Filled 2019-09-14 (×6): qty 1

## 2019-09-14 MED ORDER — LABETALOL HCL 5 MG/ML IV SOLN
INTRAVENOUS | Status: DC | PRN
  Administered 2019-09-14 (×4): 5 mg via INTRAVENOUS

## 2019-09-14 MED ORDER — MENTHOL 3 MG MT LOZG: 1.0000 | LOZENGE | OROMUCOSAL | Status: DC | PRN

## 2019-09-14 MED ORDER — OXYCODONE HCL 5 MG PO TABS
5.0000 mg | ORAL_TABLET | ORAL | Status: DC | PRN
  Administered 2019-09-15 – 2019-09-19 (×5): 5 mg via ORAL
  Filled 2019-09-14 (×5): qty 1

## 2019-09-14 MED ORDER — TESTOSTERONE CYPIONATE 200 MG/ML IM SOLN: 200.0000 mg | INTRAMUSCULAR | Status: DC

## 2019-09-14 MED ORDER — HYDROMORPHONE HCL 1 MG/ML IJ SOLN
0.5000 mg | INTRAMUSCULAR | Status: DC | PRN
  Administered 2019-09-14 – 2019-09-15 (×5): 0.5 mg via INTRAVENOUS
  Filled 2019-09-14 (×5): qty 1

## 2019-09-14 MED ORDER — ACETAMINOPHEN 325 MG PO TABS: 650.0000 mg | ORAL_TABLET | ORAL | Status: DC | PRN

## 2019-09-14 MED ORDER — LIDOCAINE-EPINEPHRINE 1 %-1:100000 IJ SOLN
INTRAMUSCULAR | Status: DC | PRN
  Administered 2019-09-14: 14 mL
  Administered 2019-09-14: 20 mL

## 2019-09-14 MED ORDER — BISACODYL 10 MG RE SUPP
10.0000 mg | Freq: Every day | RECTAL | Status: DC | PRN
  Administered 2019-09-17: 10 mg via RECTAL
  Filled 2019-09-14: qty 1

## 2019-09-14 MED ORDER — POLYETHYLENE GLYCOL 3350 17 G PO PACK
17.0000 g | PACK | Freq: Two times a day (BID) | ORAL | Status: DC
  Administered 2019-09-14 – 2019-09-23 (×14): 17 g via ORAL
  Filled 2019-09-14 (×16): qty 1

## 2019-09-14 MED ORDER — ROCURONIUM BROMIDE 10 MG/ML (PF) SYRINGE
PREFILLED_SYRINGE | INTRAVENOUS | Status: AC
  Filled 2019-09-14: qty 10

## 2019-09-14 MED ORDER — HYDROCODONE-ACETAMINOPHEN 5-325 MG PO TABS
2.0000 | ORAL_TABLET | ORAL | Status: DC | PRN
  Administered 2019-09-14 – 2019-09-19 (×15): 2 via ORAL
  Filled 2019-09-14 (×17): qty 2

## 2019-09-14 MED ORDER — BUPIVACAINE HCL (PF) 0.5 % IJ SOLN
INTRAMUSCULAR | Status: DC | PRN
  Administered 2019-09-14: 30 mL
  Administered 2019-09-14: 14 mL

## 2019-09-14 MED ORDER — DEXAMETHASONE SODIUM PHOSPHATE 10 MG/ML IJ SOLN
INTRAMUSCULAR | Status: DC | PRN
  Administered 2019-09-14: 10 mg via INTRAVENOUS

## 2019-09-14 MED ORDER — POLYETHYLENE GLYCOL 3350 17 G PO PACK: 17.0000 g | PACK | Freq: Every day | ORAL | Status: DC | PRN

## 2019-09-14 MED ORDER — PROPOFOL 10 MG/ML IV BOLUS
INTRAVENOUS | Status: DC | PRN
  Administered 2019-09-14: 200 mg via INTRAVENOUS

## 2019-09-14 MED ORDER — FLEET ENEMA 7-19 GM/118ML RE ENEM: 1.0000 | ENEMA | Freq: Once | RECTAL | Status: DC | PRN

## 2019-09-14 MED ORDER — HYDRALAZINE HCL 20 MG/ML IJ SOLN
INTRAMUSCULAR | Status: AC
  Filled 2019-09-14: qty 1

## 2019-09-14 MED ORDER — LABETALOL HCL 5 MG/ML IV SOLN
5.0000 mg | INTRAVENOUS | Status: DC | PRN
  Administered 2019-09-14 (×2): 5 mg via INTRAVENOUS

## 2019-09-14 MED ORDER — ONDANSETRON HCL 4 MG/2ML IJ SOLN
INTRAMUSCULAR | Status: DC | PRN
  Administered 2019-09-14: 4 mg via INTRAVENOUS

## 2019-09-14 MED ORDER — LISINOPRIL-HYDROCHLOROTHIAZIDE 10-12.5 MG PO TABS: 1.0000 | ORAL_TABLET | Freq: Every day | ORAL | Status: DC

## 2019-09-14 MED ORDER — ACETAMINOPHEN 500 MG PO TABS: 1000.0000 mg | ORAL_TABLET | Freq: Three times a day (TID) | ORAL | Status: DC | PRN

## 2019-09-14 MED ORDER — VITAMIN D (ERGOCALCIFEROL) 1.25 MG (50000 UNIT) PO CAPS
50000.0000 [IU] | ORAL_CAPSULE | ORAL | Status: DC
  Administered 2019-09-17 – 2019-09-20 (×2): 50000 [IU] via ORAL
  Filled 2019-09-14 (×2): qty 1

## 2019-09-14 MED ORDER — THROMBIN 5000 UNITS EX SOLR
CUTANEOUS | Status: AC
  Filled 2019-09-14: qty 15000

## 2019-09-14 MED ORDER — FENTANYL CITRATE (PF) 100 MCG/2ML IJ SOLN
INTRAMUSCULAR | Status: DC | PRN
  Administered 2019-09-14: 50 ug via INTRAVENOUS
  Administered 2019-09-14: 100 ug via INTRAVENOUS
  Administered 2019-09-14 (×2): 50 ug via INTRAVENOUS
  Administered 2019-09-14: 200 ug via INTRAVENOUS
  Administered 2019-09-14: 50 ug via INTRAVENOUS

## 2019-09-14 MED ORDER — DOCUSATE SODIUM 100 MG PO CAPS: 100.0000 mg | ORAL_CAPSULE | Freq: Two times a day (BID) | ORAL | Status: DC

## 2019-09-14 MED ORDER — PHENYLEPHRINE 40 MCG/ML (10ML) SYRINGE FOR IV PUSH (FOR BLOOD PRESSURE SUPPORT)
PREFILLED_SYRINGE | INTRAVENOUS | Status: AC
  Filled 2019-09-14: qty 10

## 2019-09-14 MED ORDER — SUFENTANIL CITRATE 50 MCG/ML IV SOLN
0.2500 ug/kg/h | INTRAVENOUS | Status: DC
  Filled 2019-09-14 (×2): qty 1

## 2019-09-14 MED ORDER — CEFAZOLIN SODIUM-DEXTROSE 2-4 GM/100ML-% IV SOLN
2.0000 g | Freq: Three times a day (TID) | INTRAVENOUS | Status: AC
  Administered 2019-09-14 – 2019-09-15 (×2): 2 g via INTRAVENOUS
  Filled 2019-09-14 (×2): qty 100

## 2019-09-14 MED ORDER — PREGABALIN 50 MG PO CAPS
150.0000 mg | ORAL_CAPSULE | Freq: Two times a day (BID) | ORAL | Status: DC
  Administered 2019-09-14 – 2019-09-22 (×17): 150 mg via ORAL
  Filled 2019-09-14 (×17): qty 1

## 2019-09-14 MED ORDER — BUPIVACAINE HCL (PF) 0.5 % IJ SOLN
INTRAMUSCULAR | Status: AC
  Filled 2019-09-14: qty 30

## 2019-09-14 MED ORDER — MELATONIN 3 MG PO TABS
9.0000 mg | ORAL_TABLET | Freq: Every day | ORAL | Status: DC
  Administered 2019-09-14 – 2019-09-17 (×4): 9 mg via ORAL
  Filled 2019-09-14 (×4): qty 3

## 2019-09-14 MED ORDER — MELATONIN 5 MG PO TABS
10.0000 mg | ORAL_TABLET | Freq: Every day | ORAL | Status: DC
  Filled 2019-09-14: qty 2

## 2019-09-14 MED ORDER — ONDANSETRON HCL 4 MG PO TABS: 4.0000 mg | ORAL_TABLET | Freq: Four times a day (QID) | ORAL | Status: DC | PRN

## 2019-09-14 MED ORDER — NITROGLYCERIN 0.4 MG SL SUBL: 0.4000 mg | SUBLINGUAL_TABLET | SUBLINGUAL | Status: DC | PRN

## 2019-09-14 MED ORDER — ACETAMINOPHEN 500 MG PO TABS
1000.0000 mg | ORAL_TABLET | Freq: Once | ORAL | Status: AC
  Administered 2019-09-14: 1000 mg via ORAL
  Filled 2019-09-14: qty 2

## 2019-09-14 MED ORDER — SUFENTANIL CITRATE 250 MCG/5ML IV SOLN
0.2500 ug/kg/h | INTRAVENOUS | Status: AC
  Administered 2019-09-14: 11:00:00 .25 ug/kg/h via INTRAVENOUS
  Filled 2019-09-14 (×2): qty 5

## 2019-09-14 MED ORDER — PHENOL 1.4 % MT LIQD: 1.0000 | OROMUCOSAL | Status: DC | PRN

## 2019-09-14 MED ORDER — ACETAMINOPHEN 650 MG RE SUPP: 650.0000 mg | RECTAL | Status: DC | PRN

## 2019-09-14 MED ORDER — METHOCARBAMOL 1000 MG/10ML IJ SOLN
500.0000 mg | Freq: Four times a day (QID) | INTRAVENOUS | Status: DC | PRN
  Administered 2019-09-19: 500 mg via INTRAVENOUS
  Filled 2019-09-14 (×2): qty 5

## 2019-09-14 MED ORDER — SUCCINYLCHOLINE CHLORIDE 200 MG/10ML IV SOSY
PREFILLED_SYRINGE | INTRAVENOUS | Status: AC
  Filled 2019-09-14: qty 10

## 2019-09-14 MED ORDER — PROPOFOL 10 MG/ML IV BOLUS
INTRAVENOUS | Status: AC
  Filled 2019-09-14: qty 40

## 2019-09-14 MED ORDER — PROPOFOL 500 MG/50ML IV EMUL
INTRAVENOUS | Status: DC | PRN
  Administered 2019-09-14: 25 ug/kg/min via INTRAVENOUS
  Administered 2019-09-14: 50 ug/kg/min via INTRAVENOUS

## 2019-09-14 MED ORDER — CEFAZOLIN SODIUM 1 G IJ SOLR
INTRAMUSCULAR | Status: AC
  Filled 2019-09-14: qty 30

## 2019-09-14 MED ORDER — SODIUM CHLORIDE 0.9% FLUSH
3.0000 mL | INTRAVENOUS | Status: DC | PRN
  Administered 2019-09-23: 3 mL via INTRAVENOUS

## 2019-09-14 MED ORDER — TRAMADOL HCL 50 MG PO TABS
50.0000 mg | ORAL_TABLET | Freq: Four times a day (QID) | ORAL | Status: DC | PRN
  Administered 2019-09-16 – 2019-09-18 (×4): 100 mg via ORAL
  Filled 2019-09-14 (×5): qty 2

## 2019-09-14 MED ORDER — PRAVASTATIN SODIUM 40 MG PO TABS
80.0000 mg | ORAL_TABLET | Freq: Every day | ORAL | Status: DC
  Administered 2019-09-15 – 2019-09-23 (×8): 80 mg via ORAL
  Filled 2019-09-14 (×8): qty 2

## 2019-09-14 MED ORDER — GLYCOPYRROLATE 0.2 MG/ML IJ SOLN
INTRAMUSCULAR | Status: DC | PRN
  Administered 2019-09-14: .1 mg via INTRAVENOUS

## 2019-09-14 MED ORDER — BUPIVACAINE LIPOSOME 1.3 % IJ SUSP
20.0000 mL | Freq: Once | INTRAMUSCULAR | Status: DC
  Filled 2019-09-14: qty 20

## 2019-09-14 MED ORDER — KETAMINE HCL 50 MG/5ML IJ SOSY
PREFILLED_SYRINGE | INTRAMUSCULAR | Status: AC
  Filled 2019-09-14: qty 10

## 2019-09-14 MED ORDER — KCL IN DEXTROSE-NACL 20-5-0.45 MEQ/L-%-% IV SOLN
INTRAVENOUS | Status: DC
  Filled 2019-09-14 (×6): qty 1000

## 2019-09-14 MED ORDER — LIDOCAINE 2% (20 MG/ML) 5 ML SYRINGE
INTRAMUSCULAR | Status: AC
  Filled 2019-09-14: qty 5

## 2019-09-14 MED ORDER — HYDROMORPHONE HCL 1 MG/ML IJ SOLN
0.2500 mg | INTRAMUSCULAR | Status: DC | PRN
  Administered 2019-09-14 (×4): 0.5 mg via INTRAVENOUS

## 2019-09-14 MED ORDER — BACLOFEN 10 MG PO TABS
20.0000 mg | ORAL_TABLET | Freq: Three times a day (TID) | ORAL | Status: DC
  Administered 2019-09-14 – 2019-09-18 (×12): 20 mg via ORAL
  Filled 2019-09-14 (×12): qty 2

## 2019-09-14 MED ORDER — LIDOCAINE 2% (20 MG/ML) 5 ML SYRINGE
INTRAMUSCULAR | Status: DC | PRN
  Administered 2019-09-14: 100 mg via INTRAVENOUS

## 2019-09-14 MED ORDER — GLYCOPYRROLATE PF 0.2 MG/ML IJ SOSY
PREFILLED_SYRINGE | INTRAMUSCULAR | Status: AC
  Filled 2019-09-14: qty 1

## 2019-09-14 MED ORDER — HYDROCHLOROTHIAZIDE 12.5 MG PO CAPS: 12.5000 mg | ORAL_CAPSULE | Freq: Every day | ORAL | Status: DC

## 2019-09-14 MED ORDER — PHENYLEPHRINE HCL-NACL 10-0.9 MG/250ML-% IV SOLN
INTRAVENOUS | Status: DC | PRN
  Administered 2019-09-14: 20 ug/min via INTRAVENOUS
  Administered 2019-09-14: 60 ug/min via INTRAVENOUS

## 2019-09-14 SURGICAL SUPPLY — 127 items
ADH SKN CLS APL DERMABOND .7 (GAUZE/BANDAGES/DRESSINGS) ×6
APPLIER CLIP 11 MED OPEN (CLIP) ×8
APR CLP MED 11 20 MLT OPN (CLIP) ×4
BAND INSRT 18 STRL LF DISP RB (MISCELLANEOUS) ×4
BAND RUBBER #18 3X1/16 STRL (MISCELLANEOUS) ×4 IMPLANT
BASE TI BOLT 5.0X22.5 VARIABLE (Bolt) ×2 IMPLANT
BASE TI IMPLANT 10X42X30 10D (Cage) ×2 IMPLANT
BASKET BONE COLLECTION (BASKET) IMPLANT
BLADE CLIPPER SURG (BLADE) ×4 IMPLANT
BOLT BASE TI 5X20 VARIABLE (Bolt) ×4 IMPLANT
BUR BARREL STRAIGHT FLUTE 4.0 (BURR) IMPLANT
CAGE MODULUS XLW 10X22X60 - 10 (Cage) ×4 IMPLANT
CANISTER SUCT 3000ML PPV (MISCELLANEOUS) ×4 IMPLANT
CARTRIDGE OIL MAESTRO DRILL (MISCELLANEOUS) ×4 IMPLANT
CLIP APPLIE 11 MED OPEN (CLIP) ×4 IMPLANT
CLIP LIGATING EXTRA MED SLVR (CLIP) ×4 IMPLANT
CLIP LIGATING EXTRA SM BLUE (MISCELLANEOUS) ×2 IMPLANT
CLIP NEUROVISION LG (CLIP) ×2 IMPLANT
CNTNR URN SCR LID CUP LEK RST (MISCELLANEOUS) ×2 IMPLANT
CONT SPEC 4OZ STRL OR WHT (MISCELLANEOUS) ×4
COUNTER NEEDLE 20 DBL MAG RED (NEEDLE) ×2 IMPLANT
COVER BACK TABLE 24X17X13 BIG (DRAPES) IMPLANT
COVER BACK TABLE 60X90IN (DRAPES) ×6 IMPLANT
COVER TRANSDUCER ULTRASND 9X24 (MISCELLANEOUS) ×4 IMPLANT
COVER WAND RF STERILE (DRAPES) ×8 IMPLANT
DECANTER SPIKE VIAL GLASS SM (MISCELLANEOUS) ×8 IMPLANT
DERMABOND ADVANCED (GAUZE/BANDAGES/DRESSINGS) ×6
DERMABOND ADVANCED .7 DNX12 (GAUZE/BANDAGES/DRESSINGS) ×8 IMPLANT
DIFFUSER DRILL AIR PNEUMATIC (MISCELLANEOUS) ×4 IMPLANT
DRAPE C-ARM 42X72 X-RAY (DRAPES) ×12 IMPLANT
DRAPE C-ARMOR (DRAPES) ×12 IMPLANT
DRAPE LAPAROTOMY 100X72X124 (DRAPES) ×12 IMPLANT
DRAPE SURG 17X23 STRL (DRAPES) ×6 IMPLANT
DRSG OPSITE POSTOP 3X4 (GAUZE/BANDAGES/DRESSINGS) ×6 IMPLANT
DRSG OPSITE POSTOP 4X6 (GAUZE/BANDAGES/DRESSINGS) ×4 IMPLANT
DRSG OPSITE POSTOP 4X8 (GAUZE/BANDAGES/DRESSINGS) ×2 IMPLANT
DURAPREP 26ML APPLICATOR (WOUND CARE) ×12 IMPLANT
ELECT BLADE 4.0 EZ CLEAN MEGAD (MISCELLANEOUS) ×16
ELECT REM PT RETURN 9FT ADLT (ELECTROSURGICAL) ×12
ELECTRODE BLDE 4.0 EZ CLN MEGD (MISCELLANEOUS) ×2 IMPLANT
ELECTRODE REM PT RTRN 9FT ADLT (ELECTROSURGICAL) ×6 IMPLANT
GAUZE 4X4 16PLY RFD (DISPOSABLE) ×4 IMPLANT
GAUZE SPONGE 4X4 12PLY STRL (GAUZE/BANDAGES/DRESSINGS) ×4 IMPLANT
GLOVE BIO SURGEON STRL SZ7.5 (GLOVE) ×4 IMPLANT
GLOVE BIO SURGEON STRL SZ8 (GLOVE) ×16 IMPLANT
GLOVE BIOGEL PI IND STRL 8 (GLOVE) ×10 IMPLANT
GLOVE BIOGEL PI IND STRL 8.5 (GLOVE) ×8 IMPLANT
GLOVE BIOGEL PI INDICATOR 8 (GLOVE) ×10
GLOVE BIOGEL PI INDICATOR 8.5 (GLOVE) ×8
GLOVE ECLIPSE 8.0 STRL XLNG CF (GLOVE) ×16 IMPLANT
GLOVE EXAM NITRILE XL STR (GLOVE) IMPLANT
GLOVE SS BIOGEL STRL SZ 7.5 (GLOVE) ×2 IMPLANT
GLOVE SUPERSENSE BIOGEL SZ 7.5 (GLOVE) ×2
GOWN STRL REUS W/ TWL LRG LVL3 (GOWN DISPOSABLE) IMPLANT
GOWN STRL REUS W/ TWL XL LVL3 (GOWN DISPOSABLE) ×10 IMPLANT
GOWN STRL REUS W/TWL 2XL LVL3 (GOWN DISPOSABLE) ×12 IMPLANT
GOWN STRL REUS W/TWL LRG LVL3 (GOWN DISPOSABLE) ×16
GOWN STRL REUS W/TWL XL LVL3 (GOWN DISPOSABLE) ×16
GUIDEWIRE NITINOL BEVEL TIP (WIRE) ×16 IMPLANT
HEMOSTAT POWDER SURGIFOAM 1G (HEMOSTASIS) ×6 IMPLANT
HEMOSTAT SNOW SURGICEL 2X4 (HEMOSTASIS) IMPLANT
INSERT FOGARTY 61MM (MISCELLANEOUS) IMPLANT
INSERT FOGARTY SM (MISCELLANEOUS) IMPLANT
KIT BASIN OR (CUSTOM PROCEDURE TRAY) ×10 IMPLANT
KIT DILATOR XLIF 5 (KITS) IMPLANT
KIT INFUSE X SMALL 1.4CC (Orthopedic Implant) ×2 IMPLANT
KIT INFUSE XX SMALL 0.7CC (Orthopedic Implant) ×2 IMPLANT
KIT POSITION SURG JACKSON T1 (MISCELLANEOUS) ×4 IMPLANT
KIT SURGICAL ACCESS MAXCESS 4 (KITS) ×2 IMPLANT
KIT TURNOVER KIT B (KITS) ×12 IMPLANT
KIT XLIF (KITS) ×2
LOOP VESSEL MAXI BLUE (MISCELLANEOUS) IMPLANT
LOOP VESSEL MINI RED (MISCELLANEOUS) IMPLANT
MARKER SKIN DUAL TIP RULER LAB (MISCELLANEOUS) ×8 IMPLANT
MODULE NVM5 NEXT GEN EMG (NEEDLE) ×2 IMPLANT
NDL HYPO 25X1 1.5 SAFETY (NEEDLE) ×4 IMPLANT
NDL I PASS (NEEDLE) IMPLANT
NDL SPNL 18GX3.5 QUINCKE PK (NEEDLE) ×2 IMPLANT
NEEDLE HYPO 25X1 1.5 SAFETY (NEEDLE) ×12 IMPLANT
NEEDLE I PASS (NEEDLE) ×8 IMPLANT
NEEDLE SPNL 18GX3.5 QUINCKE PK (NEEDLE) ×4 IMPLANT
NS IRRIG 1000ML POUR BTL (IV SOLUTION) ×8 IMPLANT
OIL CARTRIDGE MAESTRO DRILL (MISCELLANEOUS)
PACK LAMINECTOMY NEURO (CUSTOM PROCEDURE TRAY) ×12 IMPLANT
PAD ARMBOARD 7.5X6 YLW CONV (MISCELLANEOUS) ×10 IMPLANT
PATTIES SURGICAL .5 X.5 (GAUZE/BANDAGES/DRESSINGS) IMPLANT
PATTIES SURGICAL .5 X1 (DISPOSABLE) IMPLANT
PATTIES SURGICAL 1X1 (DISPOSABLE) IMPLANT
PUTTY BONE ATTRAX 10CC STRIP (Putty) ×4 IMPLANT
ROD RELINE MAS LORD 5.5X110MM (Rod) ×2 IMPLANT
ROD RELINE MAS LORD 5.5X90MM (Rod) ×2 IMPLANT
SCREW LOCK RELINE 5.5 TULIP (Screw) ×16 IMPLANT
SCREW MAS RELINE 6.5X50 POLY (Screw) ×10 IMPLANT
SCREW MAS RELINE 6.5X55 POLY (Screw) ×2 IMPLANT
SCREW RELINE MAS 7.5X50MM POLY (Screw) ×4 IMPLANT
SPONGE INTESTINAL PEANUT (DISPOSABLE) ×16 IMPLANT
SPONGE LAP 18X18 RF (DISPOSABLE) ×4 IMPLANT
SPONGE LAP 4X18 RFD (DISPOSABLE) IMPLANT
SPONGE SURGIFOAM ABS GEL SZ50 (HEMOSTASIS) ×4 IMPLANT
STAPLER SKIN PROX WIDE 3.9 (STAPLE) ×4 IMPLANT
STAPLER VISISTAT 35W (STAPLE) ×4 IMPLANT
SUT PDS AB 1 CTX 36 (SUTURE) ×2 IMPLANT
SUT PROLENE 4 0 RB 1 (SUTURE)
SUT PROLENE 4-0 RB1 .5 CRCL 36 (SUTURE) IMPLANT
SUT PROLENE 5 0 CC1 (SUTURE) IMPLANT
SUT PROLENE 6 0 C 1 30 (SUTURE) ×2 IMPLANT
SUT PROLENE 6 0 CC (SUTURE) IMPLANT
SUT SILK 0 TIES 10X30 (SUTURE) ×4 IMPLANT
SUT SILK 2 0 TIES 10X30 (SUTURE) ×4 IMPLANT
SUT SILK 2 0 TIES 17X18 (SUTURE) ×4
SUT SILK 2 0SH CR/8 30 (SUTURE) IMPLANT
SUT SILK 2-0 18XBRD TIE BLK (SUTURE) ×2 IMPLANT
SUT SILK 3 0 TIES 17X18 (SUTURE)
SUT SILK 3 0SH CR/8 30 (SUTURE) IMPLANT
SUT SILK 3-0 18XBRD TIE BLK (SUTURE) ×2 IMPLANT
SUT VIC AB 0 CT1 18XCR BRD8 (SUTURE) IMPLANT
SUT VIC AB 0 CT1 8-18 (SUTURE) ×8
SUT VIC AB 1 CT1 18XBRD ANBCTR (SUTURE) ×6 IMPLANT
SUT VIC AB 1 CT1 8-18 (SUTURE)
SUT VIC AB 2-0 CT1 18 (SUTURE) ×16 IMPLANT
SUT VIC AB 3-0 SH 8-18 (SUTURE) ×28 IMPLANT
SUT VICRYL 4-0 PS2 18IN ABS (SUTURE) IMPLANT
SYR TB 1ML 25GX5/8 (SYRINGE) IMPLANT
TOWEL GREEN STERILE (TOWEL DISPOSABLE) ×12 IMPLANT
TOWEL GREEN STERILE FF (TOWEL DISPOSABLE) ×8 IMPLANT
TRAY FOLEY MTR SLVR 16FR STAT (SET/KITS/TRAYS/PACK) ×8 IMPLANT
WATER STERILE IRR 1000ML POUR (IV SOLUTION) ×8 IMPLANT

## 2019-09-14 NOTE — Transfer of Care (Signed)
Immediate Anesthesia Transfer of Care Note  Patient: Darin Gomez  Procedure(s) Performed: Lumbar five Sacral one Anterior lumbar interbody fusion (N/A ) Right Lumbar three-four Lumbar four-five  Anterolateral lumbar interbody fusion (Right ) Lumbar three to Sacral one posterior pedicle screw fixation (N/A ) ABDOMINAL EXPOSURE (N/A )  Patient Location: PACU  Anesthesia Type:General  Level of Consciousness: drowsy  Airway & Oxygen Therapy: Patient Spontanous Breathing and Patient connected to face mask oxygen  Post-op Assessment: Report given to RN, Post -op Vital signs reviewed and stable and Patient moving all extremities  Post vital signs: Reviewed and stable  Last Vitals:  Vitals Value Taken Time  BP 154/100 09/14/19 1626  Temp    Pulse 120 09/14/19 1631  Resp 10 09/14/19 1631  SpO2 84 % 09/14/19 1631  Vitals shown include unvalidated device data.  Last Pain:  Vitals:   09/14/19 0659  PainSc: 5       Patients Stated Pain Goal: 4 ([id number])  Complications: No apparent anesthesia complications

## 2019-09-14 NOTE — Anesthesia Procedure Notes (Signed)
Arterial Line Insertion Start/End4/16/2021 7:15 AM, 09/14/2019 7:20 AM Performed by: Lowella Dell, CRNA, CRNA  Patient location: Pre-op. Preanesthetic checklist: patient identified, IV checked, site marked, risks and benefits discussed, surgical consent, monitors and equipment checked, pre-op evaluation, timeout performed and anesthesia consent Lidocaine 1% used for infiltration Left, radial was placed Catheter size: 20 G Hand hygiene performed  and maximum sterile barriers used   Attempts: 1 Procedure performed without using ultrasound guided technique. Following insertion, dressing applied and Biopatch. Post procedure assessment: normal  Patient tolerated the procedure well with no immediate complications.

## 2019-09-14 NOTE — Progress Notes (Signed)
Pt arrived on the unit in a lot of pain. Patient speech was clear, A&O X1. VS stable. Once pain meds are acknowledged RN will given medications. Wife notified of location. Pt belongings, clothing and cane at the bedside.

## 2019-09-14 NOTE — Op Note (Signed)
09/14/2019  4:05 PM  PATIENT:  Darin Gomez  69 y.o. male  PRE-OPERATIVE DIAGNOSIS:  Degenerative lumbar spinal stenosis, DDD, lumbago, radiculopathy, Spondylosis, L 5 S 1, L 45, L 34 levels  POST-OPERATIVE DIAGNOSIS:  Degenerative lumbar spinal stenosis, DDD, lumbago, radiculopathy, Spondylosis, L 5 S 1, L 45, L 34 levels  PROCEDURE:  Procedure(s): Lumbar five Sacral one Anterior lumbar interbody fusion (N/A) Right Lumbar three-four Lumbar four-five  Anterolateral lumbar interbody fusion (Right) Lumbar three to Sacral one posterior pedicle screw fixation (N/A) ABDOMINAL EXPOSURE (N/A)  SURGEON:  Surgeon(s) and Role: Panel 1:    Erline Levine, MD - Primary    * Consuella Lose, MD - Assisting Panel 2:    [name], MD - Primary  PHYSICIAN ASSISTANT:   ASSISTANTS: Poteat, RN   ANESTHESIA:   general  EBL:  200 mL   BLOOD ADMINISTERED:none  DRAINS: none   LOCAL MEDICATIONS USED:  MARCAINE    and LIDOCAINE   SPECIMEN:  No Specimen  DISPOSITION OF SPECIMEN:  N/A  COUNTS:  YES  TOURNIQUET:  * No tourniquets in log *  DICTATION: DICTATION:   INDICATIONS:  Pateint is 69 year old male with chronic and intractable back and bilateral lower extremity pain, left greater than right,  who has previously undergone posterior decompression L 34, L 45, L 5 S 1 levels.  He has severe foraminal stenosis ateach of these levels.   It was elected to take him to surgery for anterior lumbar decompression and fusion at L 5 S 1 (transitional anatomy) with XLIF L 45, L 34 and pedicle screw fixation L 3 - S 1 levels.   PROCEDURE:  Doctor Carlis Abbott performed exposure and his portion of the procedure will be dictated separately.  Upon exposing the L 5 S1 level, a localizing X ray was obtained with the C arm with LessRay.  I then incised the anterior annulus and performed a thorough discectomy with wide ligamentous releases.  The endplates were cleared of disc and cartilagenous  material and a thorough discectomy was performed with decompression of the ventral annulus and disc material.  After trials, a 10 degree, 10x 42 x 30 mm Base titanium implant  cage was placed and lagged with 2, 5.5 x 20 mm screws in S 1 and one 5.0 x 22.5 mm screw in L 5.  This spacer which was selected, packed with extra extra small BMP and Attrax .  The implant was tamped into position and positioning was confirmed with C arm.   Locking mechanisms were engaged, soft tissues were inspected and found to be in good repair.   Fascia was closed with 1 PDS running stitch, skin edges closed with 2-0 and 3-0 vicryl sutures.  Wound was dressed with a sterile occlusive dressing.    Patient was then  placed in a left lateral decubitus position on the operative table and using orthogonally projected C-arm fluoroscopy the patient was placed so that the L 45 levels were visualized in AP and lateral plane. The patient was then taped into position.  Skin was marked along with a posterior finger dissection incision. Her flank was then prepped and draped in usual sterile fashion and incisions were made sequentially at L 45 level along the iliac crest and then at L34. Finger dissection was made to enter the retroperitoneal space and then subsequently the probe was inserted into the psoas muscle from the right side initially at the L45 level. After mapping the neural  elements were able to dock the probe per the midpoint of this vertebral level and without indications electrically of too close proximity to the neural tissues. Subsequently the self-retaining tractor was.after sequential dilators were utilized the shim was employed and the interspace was cleared of psoas muscle and then incised. A thorough discectomy was performed. Instruments were used to clear the interspace of disc material. After thorough discectomy was performed and this was performed using AP and lateral fluoroscopy a 10 lordotic by 60 x 22 mm titanium  implant  was packed with remaining extra small BMP and Attrax. This was tamped into position and its position was confirmed on AP and lateral fluoroscopy.  Hemostasis was assured the wounds were irrigated interrupted Vicryl sutures.Finger dissection was made to enter the retroperitoneal space and then subsequently the probe was inserted into the psoas muscle from the right side initially at the L34 level. After mapping the neural elements were able to dock the probe per the midpoint of this vertebral level and without indications electrically of too close proximity to the neural tissues. Subsequently the self-retaining tractor was.after sequential dilators were utilized the shim was employed and the interspace was cleared of psoas muscle and then incised. A thorough discectomy was performed. Instruments were used to clear the interspace of disc material. After thorough discectomy was performed and this was performed using AP and lateral fluoroscopy a 10 lordotic by 60 x 22 mm implant was packed with remaining BMP and Attrax. This was tamped into position and its position was confirmed on AP and lateral fluoroscopy.  Hemostasis was assured at each level.  Incisions were closed with 0, 2-0, 3-0 vicryl sutures and dressed with Dermabond and an occlusive dressing.  The patient was then turned into a prone position on the Pierz table on chest rolls and using AP and lateral fluoroscopy throughout this portion of the procedure, pedicle screws were placed using Nuvasive cannulated percutaneous screws. After placing guide wires at each level with the use of nerve monitoring throughout, Nuvasive Reline towers were docked on the L3, L  L 4, L 5, S 1 levels.  Pedicle screws were placed bilaterally at L 3 (6.5 x 50), 2 at L 4  6.5 50, 2 at  L 5 (6.5 x 55 left and 6.5 x 50 right) and S1 (7.5 x 50 at each level). 110 mm rod  was then affixed to the screw heads on the left and a 5.5 x 90 mm rod was used on the right and locked down on  the screws. All connections were then torqued and the Towers were disassembled. The wounds were irrigated and then closed with 1, 2-0 and 3-0 Vicryl stitches.   Sterile occlusive dressings were placed with Dermabond and occlusive dressings. The patient was then extubated in the operating room and taken to recovery in stable and satisfactory condition having tolerated his operation well. Counts were correct at the end of the case. Sterile occlusive dressing was placed with Dermabond. The patient was then extubated in the operating room and taken to recovery in stable and satisfactory condition having tolerated his operation well. Counts were correct at the end of the case.  Patient was extubated in the OR and taken to recovery having tolerated her surgery well.  Counts were correct.    Retractor Times:  L 34 (15:42 mins); L 45 (16:11 mins).  Pelvic Parameters:  Preop:  PT 29 degrees; PI 59 degrees; LL -49; PI- LL +10 degree; SVA 25 mm;  PLAN OF CARE: Admit to inpatient   PATIENT DISPOSITION:  PACU - hemodynamically stable.   Delay start of Pharmacological VTE agent (>24hrs) due to surgical blood loss or risk of bleeding: yes   

## 2019-09-14 NOTE — H&P (Signed)
History and Physical Interval Note:  09/14/2019 7:22 AM  Darin Gomez  has presented today for surgery, with the diagnosis of Degenerative lumbar spinal stenosis.  The various methods of treatment have been discussed with the patient and family. After consideration of risks, benefits and other options for treatment, the patient has consented to  Procedure(s) with comments: Lumbar 5 Sacral 1 Anterior lumbar interbody fusion (N/A) - with Dr Monica Martinez Right Lumbar 3-4 Lumbar 4-5 Anterolateral lumbar interbody fusion (Right) Lumbar 3 to Sacral 1 posterior pedicle screw fixation (N/A) ABDOMINAL EXPOSURE (N/A) as a surgical intervention.  The patient's history has been reviewed, patient examined, no change in status, stable for surgery.  I have reviewed the patient's chart and labs.  Questions were answered to the patient's satisfaction.    L5-S1 ALIF  [name]  Patient name: Darin Gomez MRN: [id number] DOB: [birth year] Sex: male  REASON FOR CONSULT: Evaluate for L5-S1 ALIF  HPI:  Darin Gomez is a 69 y.o. male, with history of hyperlipidemia and chronic back pain that presents for evaluation of planned L5-S1 ALIF with Dr. Vertell Limber. Patient reports chronic back pain and has had previous posterior surgery in 2012 with bilateral L3-L4-L5 laminectomies and discectomy at L3-L4. He also went on to have a spinal stimulator placed in 2015. He reports bilateral lower extremity radiculopathy that is very severe. Ultimately has been evaluated Dr. Vertell Limber who plans an L5-S1 ALIF with additional L3-L4 and L4-L5 XLIF. He denies any active tobacco abuse. He has never had abdominal surgery. Very debilitated by his back pain.      Past Medical History:  Diagnosis Date  . Allergy   . Arthritis    mainly in back  . Back pain   . Balance disorder   . Blood in urine   . Chest pain   . Constipation   . Diverticulosis 02/14/2018   noted on colonoscopy  . Environmental allergies   . Foot  swelling    Bilateral  . GERD (gastroesophageal reflux disease)   . Headache(784.0)    occas. migraines (from last surgery)  . Heart murmur    as child....Marland Kitchenno problems now  . High cholesterol   . History of colon polyps 02/14/2018  . Hyperlipidemia   . Hypertension    dx 10 yrs or more ago  . Insomnia   . Neck pain   . Neuropathy   . Night sweats   . Painful swelling of joint   . PONV (postoperative nausea and vomiting)   . Poor circulation of extremity    legs  . Shortness of breath   . Shortness of breath dyspnea    occasional  . Sinus disorder   . Spinal headache   . Vertigo         Past Surgical History:  Procedure Laterality Date  . BACK SURGERY    . CARDIAC CATHETERIZATION     03/04/14  . COLONOSCOPY    . FRACTURE SURGERY     left arm  . KNEE SURGERY Right 01/02/2018   torn minicus  . LEFT HEART CATHETERIZATION WITH CORONARY ANGIOGRAM N/A 03/04/2014   Procedure: LEFT HEART CATHETERIZATION WITH CORONARY ANGIOGRAM; Surgeon: Peter M Martinique, MD; Location: [facility] CATH LAB; Service: Cardiovascular; Laterality: N/A;  . LUMBAR LAMINECTOMY/DECOMPRESSION MICRODISCECTOMY  03/15/2012   Procedure: LUMBAR LAMINECTOMY/DECOMPRESSION MICRODISCECTOMY 2 LEVELS; Surgeon: Floyce Stakes, MD; Location: [facility]; Service: Neurosurgery; Laterality: Bilateral; Bilateral Lumbar three, lumbar four, lumbar five Laminectomy  . POLYPECTOMY    .  SPINAL CORD STIMULATOR INSERTION N/A 05/29/2014   Procedure: LUMBAR SPINAL CORD STIMULATOR INSERTION; Surgeon: Bonna Gains, MD; Location: [facility]; Service: Neurosurgery; Laterality: N/A;  . TOTAL KNEE ARTHROPLASTY Right 02/08/2019   Procedure: TOTAL KNEE ARTHROPLASTY; Surgeon: Paralee Cancel, MD; Location: [facility]; Service: Orthopedics; Laterality: Right; 70 mins        Family History  Problem Relation Age of Onset  . Heart attack Mother   . Hypertension Mother   . Hyperlipidemia Mother   . Diabetes Mother   . Cancer Father   . Heart  attack Brother 32  . Breast cancer Neg Hx   . Colon polyps Neg Hx   . Colon cancer Neg Hx   . Esophageal cancer Neg Hx   . Liver cancer Neg Hx   . Ovarian cancer Neg Hx   . Pancreatic cancer Neg Hx   . Prostate cancer Neg Hx   . Rectal cancer Neg Hx   . Stomach cancer Neg Hx    SOCIAL HISTORY:  Social History        Socioeconomic History  . Marital status: Married    Spouse name: Not on file  . Number of children: 3  . Years of education: Not on file  . Highest education level: Some college, no degree  Occupational History  . Not on file  Tobacco Use  . Smoking status: Former Smoker    Years: 45.00    Types: Cigarettes  . Smokeless tobacco: Former Systems developer    Types: Chew  . Tobacco comment: quit smoking cigarettes > 30 years ago  Substance and Sexual Activity  . Alcohol use: Yes    Alcohol/week: 5.0 - 9.0 standard drinks    Types: 3 - 4 Glasses of wine, 2 Shots of liquor per week    Comment: occasional beer  . Drug use: Never  . Sexual activity: Not Currently  Other Topics Concern  . Not on file  Social History Narrative  . Not on file   Social Determinants of Health      Financial Resource Strain: Low Risk   . Difficulty of Paying Living Expenses: Not very hard  Food Insecurity: Food Insecurity Present  . Worried About Charity fundraiser in the Last Year: Sometimes true  . Ran Out of Food in the Last Year: Sometimes true  Transportation Needs: No Transportation Needs  . Lack of Transportation (Medical): No  . Lack of Transportation (Non-Medical): No  Physical Activity: Insufficiently Active  . Days of Exercise per Week: 2 days  . Minutes of Exercise per Session: 20 min  Stress: Stress Concern Present  . Feeling of Stress : To some extent  Social Connections: Unknown  . Frequency of Communication with Friends and Family: Not on file  . Frequency of Social Gatherings with Friends and Family: Not on file  . Attends Religious Services: 1 to 4 times per year  .  Active Member of Clubs or Organizations: No  . Attends Archivist Meetings: Never  . Marital Status: Married  Human resources officer Violence: Not At Risk  . Fear of Current or Ex-Partner: No  . Emotionally Abused: No  . Physically Abused: No  . Sexually Abused: No        Allergies  Allergen Reactions  . Gabapentin Swelling  . Keppra [Levetiracetam] Nausea And Vomiting  . Other Rash    Plastic tape: Paper tape ONLY!   . Valium [Diazepam] Other (See Comments)    Significant depression; "a different  person"         Current Outpatient Medications  Medication Sig Dispense Refill  . acetaminophen (TYLENOL) 500 MG tablet Take 2 tablets (1,000 mg total) by mouth every 8 (eight) hours. 30 tablet 0  . baclofen (LIORESAL) 20 MG tablet Take 1 tablet (20 mg total) by mouth 3 (three) times daily. (Patient taking differently: Take 20 mg by mouth 3 (three) times daily. Takes at [place], 3PM, 8PM) 90 each 4  . cetirizine (ZYRTEC) 10 MG tablet Take 10 mg by mouth daily.     Marland Kitchen docusate sodium (COLACE) 100 MG capsule Take 1 capsule (100 mg total) by mouth 2 (two) times daily. 28 capsule 0  . lisinopril-hydrochlorothiazide (PRINZIDE,ZESTORETIC) 10-12.5 MG per tablet Take 1 tablet by mouth daily.    . meclizine (ANTIVERT) 25 MG tablet Take 25 mg by mouth 3 (three) times daily as needed for dizziness.     . Melatonin 10 MG CAPS Take 10 mg by mouth at bedtime.    Marland Kitchen morphine (MS CONTIN) 30 MG 12 hr tablet Take 30 mg by mouth every 12 (twelve) hours. Take 8am and 8pm    . Multiple Vitamins-Minerals (MULTIVITAMIN WITH MINERALS) tablet Take 1 tablet by mouth daily. Spectravite    . nitroGLYCERIN (NITROSTAT) 0.4 MG SL tablet Place 1 tablet (0.4 mg total) under the tongue every 5 (five) minutes as needed for chest pain. 25 tablet 3  . nystatin cream (MYCOSTATIN) Apply 1 application topically daily as needed for dry skin.     . polyethylene glycol (MIRALAX / GLYCOLAX) 17 g packet Take 17 g by mouth 2 (two) times  daily. 28 packet 0  . pravastatin (PRAVACHOL) 80 MG tablet Take 80 mg by mouth daily.    . pregabalin (LYRICA) 75 MG capsule TAKE ONE CAPSULE BY MOUTH THREE TIMES DAILY (Patient taking differently: Take 75 mg by mouth 3 (three) times daily. Takes at [place], 3PM, 8PM) 90 capsule 0  . promethazine (PHENERGAN) 12.5 MG tablet Take 2 tablets (25 mg total) by mouth every 6 (six) hours as needed for nausea. 30 tablet 1  . sildenafil (VIAGRA) 100 MG tablet Take 100 mg by mouth daily as needed for erectile dysfunction.    . traMADol (ULTRAM) 50 MG tablet Take 1-2 tablets (50-100 mg total) by mouth every 6 (six) hours as needed for moderate pain or severe pain. 40 tablet 0  . Vitamin D, Ergocalciferol, (DRISDOL) 50000 UNITS CAPS capsule Take 50,000 Units by mouth 2 (two) times a week.     . ferrous sulfate (FERROUSUL) 325 (65 FE) MG tablet Take 1 tablet (325 mg total) by mouth 3 (three) times daily with meals for 14 days. 42 tablet 0   No current facility-administered medications for this visit.   REVIEW OF SYSTEMS:  [X]  denotes positive finding, [ ]  denotes negative finding  Cardiac  Comments:  Chest pain or chest pressure:    Shortness of breath upon exertion:    Short of breath when lying flat:    Irregular heart rhythm:        Vascular    Pain in calf, thigh, or hip brought on by ambulation:    Pain in feet at night that wakes you up from your sleep:     Blood clot in your veins:    Leg swelling:         Pulmonary    Oxygen at home:    Productive cough:     Wheezing:  Neurologic    Sudden weakness in arms or legs:     Sudden numbness in arms or legs:     Sudden onset of difficulty speaking or slurred speech:    Temporary loss of vision in one eye:     Problems with dizziness:         Gastrointestinal    Blood in stool:     Vomited blood:         Genitourinary    Burning when urinating:     Blood in urine:        Psychiatric    Major depression:         Hematologic     Bleeding problems:    Problems with blood clotting too easily:        Skin    Rashes or ulcers:        Constitutional    Fever or chills:    PHYSICAL EXAM:     Vitals:   08/28/19 1430  BP: 120/66  Pulse: 62  Resp: 18  Temp: 98.3 F (36.8 C)  TempSrc: Temporal  SpO2: 95%  Weight: 295 lb (133.8 kg)  Height: 6' (1.829 m)   GENERAL: The patient is a well-nourished male, in no acute distress. The vital signs are documented above.  CARDIAC: There is a regular rate and rhythm.  VASCULAR:  Palpable femoral pulses bilateral  Palpable dorsalis pedis pulses bilateral  PULMONARY: There is good air exchange bilaterally without wheezing or rales.  ABDOMEN: Soft and non-tender. No abdominal scars.  MUSCULOSKELETAL: There are no major deformities or cyanosis.  NEUROLOGIC: No focal weakness or paresthesias are detected.  SKIN: There are no ulcers or rashes noted.  PSYCHIATRIC: The patient has a normal affect.  DATA:  I independently reviewed his CT lumbar spine from 05/07/2019 and on my review his L5-S1 disc space is at the same level of the aortic and iliac vein bifurcation. I would approach this more from a traditional L4-L5 approach.  Assessment/Plan:  69 year old male with chronic lower back pain with bilateral lower extremity radiculopathy that presents for evaluation of L5-S1 ALIF. I reviewed his CT lumbar spine from 05/07/2019 and appears to have transitional anatomy and his L5-S1 disc space is basically at the level of aortic and iliac vein bifurcation. I would likely approach this from a traditional L4-L5 approach by mobilizing both iliac artery and iliac vein across the disc space rather than splaying the vessels like traditional L5-S1 approach. I discussed steps of surgery in detail with the patient as well as risk and benefits. Discussed incision over the left rectus with mobilization of the rectus muscle and subsequent mobilization of the intestines and peritoneum as well as the left  ureter and iliac artery and vein and possible injury to the above structures. Look forward to helping Dr. Vertell Limber.  [name], MD  [facility]  Office: [phone]

## 2019-09-14 NOTE — Anesthesia Procedure Notes (Signed)
Procedure Name: Intubation Date/Time: 09/14/2019 7:50 AM Performed by: Lowella Dell, CRNA Pre-anesthesia Checklist: Patient identified, Emergency Drugs available, Suction available and Patient being monitored Patient Re-evaluated:Patient Re-evaluated prior to induction Oxygen Delivery Method: Circle System Utilized Preoxygenation: Pre-oxygenation with 100% oxygen Induction Type: IV induction Ventilation: Mask ventilation without difficulty Laryngoscope Size: Mac and 4 Grade View: Grade II Tube type: Oral Tube size: 7.5 mm Number of attempts: 1 Airway Equipment and Method: Stylet Placement Confirmation: ETT inserted through vocal cords under direct vision,  positive ETCO2 and breath sounds checked- equal and bilateral Secured at: 23 cm Tube secured with: Tape Dental Injury: Teeth and Oropharynx as per pre-operative assessment

## 2019-09-14 NOTE — Progress Notes (Signed)
Sleepy, but arousable.  MAEW.  Doing well.

## 2019-09-14 NOTE — Anesthesia Postprocedure Evaluation (Signed)
Anesthesia Post Note  Patient: Darin Gomez  Procedure(s) Performed: Lumbar five Sacral one Anterior lumbar interbody fusion (N/A ) Right Lumbar three-four Lumbar four-five  Anterolateral lumbar interbody fusion (Right ) Lumbar three to Sacral one posterior pedicle screw fixation (N/A ) ABDOMINAL EXPOSURE (N/A )     Patient location during evaluation: PACU Anesthesia Type: General Level of consciousness: awake and alert Pain management: pain level controlled Vital Signs Assessment: post-procedure vital signs reviewed and stable Respiratory status: spontaneous breathing, nonlabored ventilation, respiratory function stable and patient connected to nasal cannula oxygen Cardiovascular status: blood pressure returned to baseline and stable Postop Assessment: no apparent nausea or vomiting Anesthetic complications: no    Last Vitals:  Vitals:   09/14/19 1949 09/14/19 1951  BP:  133/65  Pulse: 77 80  Resp: 17 (!) 21  Temp: 36.7 C   SpO2: 92% 91%    Last Pain:  Vitals:   09/14/19 1949  TempSrc: Oral  PainSc: 10-Worst pain ever                 Tiajuana Amass

## 2019-09-14 NOTE — Op Note (Signed)
Date: September 14, 2019  Preoperative diagnosis: Chronic back pain  Postoperative diagnosis: Same  Procedure: Anterior spine exposure at the L5-S1 disc space for anterior lumbar interbody fusion (ALIF)  Surgeon: Dr. [name], MD  Co-surgeon: Dr. Erline Levine, MD  Indication: Patient is a 69 year old male with morbid obesity who has chronic lower back pain.  Ultimately he has previously had posterior back surgeries.  After further evaluation Dr. Vertell Limber recommended an L5-S1 ALIF with vascular surgery exposure as well as additional L3-L4 and L4-L5 XLIF.  Risk and benefits have been discussed with the patient and his wife.  Findings: Patient has transitional anatomy so his L5-S1 disc space was actually at the aortic and iliac vein bifurcation (like a traditional L4-L5 space).  As a result, left paramedian incision was made and this was approached by mobilizing the left iliac artery and vein across midline from a lateral approach to expose the L5-S1 disc space.  This did require ligation of two iliolumbar branches.  Anesthesia: General  Details: Patient was taken to the operating room after informed consent was obtained.  He was placed on operative table in supine position.  After anesthesia induced we used the fluoroscopic C arm in the lateral position to mark the L5-S1 disc space in the setting of transitional anatomy as noted above.  Ultimately then marked out a paramedian incision over the left rectus muscle.  The abdominal wall was then prepped and draped in usual sterile fashion.  Patient was given preoperative antibiotics.  I then made a longitudinal incision over the left rectus muscle in the paramedian location (just off midline).  Dissected down with Bovie cautery and opened the subcutaneous tissue until we encountered the anterior rectus sheath.  We used cerebellum retractors for added visualization.  The anterior sheath was then opened longitudinally with Bovie cautery.  I then  circumferentially mobilized the left rectus muscle and entered lateral to the muscle into the retroperitoneal space.  We then mobilized peritoneum off the posterior rectus sheath and above arcuate line the posterior sheath was then opened bluntly with Metzenbaum scissors.  I then used Kd for blunt mobilization and the peritoneum and left ureter were mobilized across midline.  My assistant used hand-held Wiley retractors to assist pulling the peritoneal contents and ureter across midline while I continued to mobilize all the contents across the midline to expose the disc space.  Given his transitional anatomy the plan was to mobilize the left iliac artery and vein across midline like a traditional L4-L5 approach.  I subsequently had some difficulty visualizing in the base of the wound due to his morbid obesity.  We used a Presenter, broadcasting in the craniocaudal direction for added visualization.  Ultimately identified one iliolumbar vein that was ligated between 3-0 silk ties and divided and reinforced with vessel clips.  The iliac artery was soft and easily mobilized across midline.  The left iliac vein was very scarred to the anterior disc space and required a lot of careful dissection and mobilization.  Ultimately we ended up placing the fixed NuVasive retractor given the overall depth of the surgical exposure.  I then placed 200 length fixed retractors blades cranial caudal and on the patient's left side with my assistant continuing to pull the peritoneum across midline with handheld retractors.  Through this I then continued to bluntly mobilize the left iliac artery and vein across midline and identified a second iliolumbar branch that had to be ligated between 3-0 silk ties and divided with vessel  clips.  Once we had adequate mobilization I placed a spinal needle in the disc spase and we confirmed on lateral fluoroscopy we were at the correct L5-S1 level.  We continued to reposition retractors using 200 fixed  NuVasive retractor blades both cranial caudal and right and left of the disc space for added visualization.  At that point time the case was turned over to Dr. Vertell Limber.  Please see his dictation for the remainder of the case.  Complication: None  Condition: Stable  [name], MD [facility] Office: [place]

## 2019-09-14 NOTE — Brief Op Note (Signed)
09/14/2019  4:05 PM  PATIENT:  Darin Gomez  69 y.o. male  PRE-OPERATIVE DIAGNOSIS:  Degenerative lumbar spinal stenosis, DDD, lumbago, radiculopathy, Spondylosis, L 5 S 1, L 45, L 34 levels  POST-OPERATIVE DIAGNOSIS:  Degenerative lumbar spinal stenosis, DDD, lumbago, radiculopathy, Spondylosis, L 5 S 1, L 45, L 34 levels  PROCEDURE:  Procedure(s): Lumbar five Sacral one Anterior lumbar interbody fusion (N/A) Right Lumbar three-four Lumbar four-five  Anterolateral lumbar interbody fusion (Right) Lumbar three to Sacral one posterior pedicle screw fixation (N/A) ABDOMINAL EXPOSURE (N/A)  SURGEON:  Surgeon(s) and Role: Panel 1:    Erline Levine, MD - Primary    * Consuella Lose, MD - Assisting Panel 2:    [name], MD - Primary  PHYSICIAN ASSISTANT:   ASSISTANTS: Poteat, RN   ANESTHESIA:   general  EBL:  200 mL   BLOOD ADMINISTERED:none  DRAINS: none   LOCAL MEDICATIONS USED:  MARCAINE    and LIDOCAINE   SPECIMEN:  No Specimen  DISPOSITION OF SPECIMEN:  N/A  COUNTS:  YES  TOURNIQUET:  * No tourniquets in log *  DICTATION: DICTATION:   INDICATIONS:  Pateint is 69 year old male with chronic and intractable back and bilateral lower extremity pain, left greater than right,  who has previously undergone posterior decompression L 34, L 45, L 5 S 1 levels.  He has severe foraminal stenosis ateach of these levels.   It was elected to take him to surgery for anterior lumbar decompression and fusion at L 5 S 1 (transitional anatomy) with XLIF L 45, L 34 and pedicle screw fixation L 3 - S 1 levels.   PROCEDURE:  Doctor Carlis Abbott performed exposure and his portion of the procedure will be dictated separately.  Upon exposing the L 5 S1 level, a localizing X ray was obtained with the C arm with LessRay.  I then incised the anterior annulus and performed a thorough discectomy with wide ligamentous releases.  The endplates were cleared of disc and cartilagenous  material and a thorough discectomy was performed with decompression of the ventral annulus and disc material.  After trials, a 10 degree, 10x 42 x 30 mm Base titanium implant  cage was placed and lagged with 2, 5.5 x 20 mm screws in S 1 and one 5.0 x 22.5 mm screw in L 5.  This spacer which was selected, packed with extra extra small BMP and Attrax .  The implant was tamped into position and positioning was confirmed with C arm.   Locking mechanisms were engaged, soft tissues were inspected and found to be in good repair.   Fascia was closed with 1 PDS running stitch, skin edges closed with 2-0 and 3-0 vicryl sutures.  Wound was dressed with a sterile occlusive dressing.    Patient was then  placed in a left lateral decubitus position on the operative table and using orthogonally projected C-arm fluoroscopy the patient was placed so that the L 45 levels were visualized in AP and lateral plane. The patient was then taped into position.  Skin was marked along with a posterior finger dissection incision. Her flank was then prepped and draped in usual sterile fashion and incisions were made sequentially at L 45 level along the iliac crest and then at L34. Finger dissection was made to enter the retroperitoneal space and then subsequently the probe was inserted into the psoas muscle from the right side initially at the L45 level. After mapping the neural  elements were able to dock the probe per the midpoint of this vertebral level and without indications electrically of too close proximity to the neural tissues. Subsequently the self-retaining tractor was.after sequential dilators were utilized the shim was employed and the interspace was cleared of psoas muscle and then incised. A thorough discectomy was performed. Instruments were used to clear the interspace of disc material. After thorough discectomy was performed and this was performed using AP and lateral fluoroscopy a 10 lordotic by 60 x 22 mm titanium  implant  was packed with remaining extra small BMP and Attrax. This was tamped into position and its position was confirmed on AP and lateral fluoroscopy.  Hemostasis was assured the wounds were irrigated interrupted Vicryl sutures.Finger dissection was made to enter the retroperitoneal space and then subsequently the probe was inserted into the psoas muscle from the right side initially at the L34 level. After mapping the neural elements were able to dock the probe per the midpoint of this vertebral level and without indications electrically of too close proximity to the neural tissues. Subsequently the self-retaining tractor was.after sequential dilators were utilized the shim was employed and the interspace was cleared of psoas muscle and then incised. A thorough discectomy was performed. Instruments were used to clear the interspace of disc material. After thorough discectomy was performed and this was performed using AP and lateral fluoroscopy a 10 lordotic by 60 x 22 mm implant was packed with remaining BMP and Attrax. This was tamped into position and its position was confirmed on AP and lateral fluoroscopy.  Hemostasis was assured at each level.  Incisions were closed with 0, 2-0, 3-0 vicryl sutures and dressed with Dermabond and an occlusive dressing.  The patient was then turned into a prone position on the Crescent table on chest rolls and using AP and lateral fluoroscopy throughout this portion of the procedure, pedicle screws were placed using Nuvasive cannulated percutaneous screws. After placing guide wires at each level with the use of nerve monitoring throughout, Nuvasive Reline towers were docked on the L3, L  L 4, L 5, S 1 levels.  Pedicle screws were placed bilaterally at L 3 (6.5 x 50), 2 at L 4  6.5 50, 2 at  L 5 (6.5 x 55 left and 6.5 x 50 right) and S1 (7.5 x 50 at each level). 110 mm rod  was then affixed to the screw heads on the left and a 5.5 x 90 mm rod was used on the right and locked down on  the screws. All connections were then torqued and the Towers were disassembled. The wounds were irrigated and then closed with 1, 2-0 and 3-0 Vicryl stitches.   Sterile occlusive dressings were placed with Dermabond and occlusive dressings. The patient was then extubated in the operating room and taken to recovery in stable and satisfactory condition having tolerated his operation well. Counts were correct at the end of the case. Sterile occlusive dressing was placed with Dermabond. The patient was then extubated in the operating room and taken to recovery in stable and satisfactory condition having tolerated his operation well. Counts were correct at the end of the case.  Patient was extubated in the OR and taken to recovery having tolerated her surgery well.  Counts were correct.    Retractor Times:  L 34 (15:42 mins); L 45 (16:11 mins).  Pelvic Parameters:  Preop:  PT 29 degrees; PI 59 degrees; LL -49; PI- LL +10 degree; SVA 25 mm;  PLAN OF CARE: Admit to inpatient   PATIENT DISPOSITION:  PACU - hemodynamically stable.   Delay start of Pharmacological VTE agent (>24hrs) due to surgical blood loss or risk of bleeding: yes   

## 2019-09-14 NOTE — Interval H&P Note (Signed)
History and Physical Interval Note:  09/14/2019 7:34 AM  Darin Gomez  has presented today for surgery, with the diagnosis of Degenerative lumbar spinal stenosis.  The various methods of treatment have been discussed with the patient and family. After consideration of risks, benefits and other options for treatment, the patient has consented to  Procedure(s) with comments: Lumbar 5 Sacral 1 Anterior lumbar interbody fusion (N/A) - with Dr Monica Martinez Right Lumbar 3-4 Lumbar 4-5 Anterolateral lumbar interbody fusion (Right) Lumbar 3 to Sacral 1 posterior pedicle screw fixation (N/A) ABDOMINAL EXPOSURE (N/A) as a surgical intervention.  The patient's history has been reviewed, patient examined, no change in status, stable for surgery.  I have reviewed the patient's chart and labs.  Questions were answered to the patient's satisfaction.     Peggyann Shoals

## 2019-09-14 NOTE — Progress Notes (Signed)
Orthopedic Tech Progress Note Patient Details:  Darin Gomez [birth year] [id number] Per MD patient has BRACE [name], male   DOB: [birth year], 69 y.o.   MRN: [id number]   [name] 09/14/2019, 5:37 PM

## 2019-09-15 LAB — CBC
HCT: 39.9 % (ref 39.0–52.0)
Hemoglobin: 12.5 g/dL — ABNORMAL LOW (ref 13.0–17.0)
MCH: 29.8 pg (ref 26.0–34.0)
MCHC: 31.3 g/dL (ref 30.0–36.0)
MCV: 95 fL (ref 80.0–100.0)
Platelets: 254 10*3/uL (ref 150–400)
RBC: 4.2 MIL/uL — ABNORMAL LOW (ref 4.22–5.81)
RDW: 13.5 % (ref 11.5–15.5)
WBC: 11.1 10*3/uL — ABNORMAL HIGH (ref 4.0–10.5)
nRBC: 0 % (ref 0.0–0.2)

## 2019-09-15 LAB — BASIC METABOLIC PANEL
Anion gap: 9 (ref 5–15)
BUN: 14 mg/dL (ref 8–23)
CO2: 27 mmol/L (ref 22–32)
Calcium: 7.6 mg/dL — ABNORMAL LOW (ref 8.9–10.3)
Chloride: 101 mmol/L (ref 98–111)
Creatinine, Ser: 1.09 mg/dL (ref 0.61–1.24)
GFR calc Af Amer: >60 mL/min (ref >60)
GFR calc non Af Amer: >60 mL/min (ref >60)
Glucose, Bld: 137 mg/dL — ABNORMAL HIGH (ref 70–99)
Potassium: 3.5 mmol/L (ref 3.5–5.1)
Sodium: 137 mmol/L (ref 135–145)

## 2019-09-15 MED ORDER — HYDROMORPHONE HCL 1 MG/ML IJ SOLN
1.0000 mg | INTRAMUSCULAR | Status: DC | PRN
  Administered 2019-09-15 – 2019-09-16 (×5): 1 mg via INTRAVENOUS
  Administered 2019-09-17: 2 mg via INTRAVENOUS
  Administered 2019-09-17: 1 mg via INTRAVENOUS
  Administered 2019-09-17 – 2019-09-20 (×3): 2 mg via INTRAVENOUS
  Filled 2019-09-15: qty 1
  Filled 2019-09-15: qty 2
  Filled 2019-09-15: qty 1
  Filled 2019-09-15: qty 2
  Filled 2019-09-15 (×2): qty 1
  Filled 2019-09-15 (×2): qty 2
  Filled 2019-09-15 (×3): qty 1

## 2019-09-15 MED ORDER — SIMETHICONE 80 MG PO CHEW
80.0000 mg | CHEWABLE_TABLET | Freq: Four times a day (QID) | ORAL | Status: DC | PRN
  Administered 2019-09-15 – 2019-09-17 (×4): 80 mg via ORAL
  Filled 2019-09-15 (×4): qty 1

## 2019-09-15 MED ORDER — SODIUM CHLORIDE 0.9 % IV BOLUS
500.0000 mL | Freq: Once | INTRAVENOUS | Status: AC
  Administered 2019-09-15: 500 mL via INTRAVENOUS

## 2019-09-15 NOTE — Progress Notes (Signed)
  NEUROSURGERY PROGRESS NOTE   Pt reports significant low back and hip pain. Passing gas.  EXAM:  BP (!) 123/54 (BP Location: Left Leg)   Pulse 73   Temp 98 F (36.7 C) (Oral)   Resp 16   Ht 6' (1.829 m)   Wt (!) 136.6 kg   SpO2 97%   BMI 40.84 kg/m   Awake, alert, oriented  Speech fluent, appropriate  CN grossly intact  Moving BLE well Wound c/d/i  IMPRESSION:  69 y.o. male POD#1 L5-S1 ALIF, L3-4, L4-5 XLIF, L3-S1 PSI/PLA. Significant back/hip pain  PLAN: - Will increase PRN dose of IV dilaudid rather than starting PCA - Cont home meds inc MS Contin and baclofen - Can start to mobilize once pain is better controlled.

## 2019-09-15 NOTE — Progress Notes (Signed)
Pt c/o pain to bilateral hips and lower abdomen with min relief from pain medications. MD notified and order obtained for simethicone PO 80mg  Q6PRN. Pt educated on medication and voiced verbalization. Pt also educated on ambulation and ROM while in the bed to alleviate gas pain.

## 2019-09-15 NOTE — Progress Notes (Signed)
PT Cancellation Note  Patient Details Name: RALLY SEGUR MRN: [id number] DOB: Nov 12, 1950   Cancelled Treatment:    Reason Eval/Treat Not Completed: Pain limiting ability to participate. Pt continues to report 10/10 pain with additional meds and request defer therapy initiation to next date.   Sabatino Williard B Hutson Luft 09/15/2019, 11:31 AM  [name], PT Acute Rehabilitation Services Pager: [phone] Office: [phone]

## 2019-09-15 NOTE — Progress Notes (Signed)
BP 90s/40s for 0400 vitals. RN notified on-call neurosurgery provider who ordered a 545mL NS bolus and labs. RN administered blous. BP 113/47 (64) post-bolus administration at 0606. Patient resting in bed with no feelings of dizziness/lightheadedness.

## 2019-09-15 NOTE — Progress Notes (Signed)
PT Cancellation Note  Patient Details Name: Darin Gomez MRN: [id number] DOB: 26-Aug-1950   Cancelled Treatment:    Reason Eval/Treat Not Completed: Pain limiting ability to participate(pt reports pain 10/10 with MD addressing and deferred therapy at this time)   [name] 09/15/2019, 9:10 AM  [name], PT Acute Rehabilitation Services Pager: [phone] Office: [phone]

## 2019-09-15 NOTE — Progress Notes (Signed)
OT Cancellation Note  Patient Details Name: Darin Gomez MRN: [id number] DOB: [birth year]   Cancelled Treatment:    Reason Eval/Treat Not Completed: (P) Pain limiting ability to participate Pt in "excruciating pain". Asked if he wanted to try to work with OT after he received pain meds however pt declined. Will attempt later date.   [name],[name] 09/15/2019, 3:55 PM  [name], OT/L   Acute OT Clinical Specialist Acute Rehabilitation Services Pager [phone] Office [phone]

## 2019-09-15 NOTE — Progress Notes (Addendum)
[facility]  Subjective  -significant 10 out of 10 pain in his back and hips overnight.  Did not sleep.  Has been somewhat nauseated and cannot take his p.o. pain meds.   Objective (!) 123/54 73 98 F (36.7 C) (Oral) 16 97%  Intake/Output Summary (Last 24 hours) at 09/15/2019 1031 Last data filed at 09/15/2019 0458 Gross per 24 hour  Intake 2000 ml  Output 1215 ml  Net 785 ml    Abdomen is distended Left paramedian incision is clean dry intact Palpable left dorsalis pedis pulse  Laboratory Lab Results: Recent Labs    09/15/19 0531  WBC 11.1*  HGB 12.5*  HCT 39.9  PLT 254   BMET Recent Labs    09/15/19 0531  NA 137  K 3.5  CL 101  CO2 27  GLUCOSE 137*  BUN 14  CREATININE 1.09  CALCIUM 7.6*    COAG Lab Results  Component Value Date   INR 1.1 03/01/2014   INR 1.06 12/05/2010   No results found for: PTT  Assessment/Planning:  Postop day 1 status post L5-S1 ALIF via anterior left retroperitoneal approach.  He has had significant discomfort and pain overnight more in his back and hip that he states is 10 out of 10.  He does remain nauseated with a little distention and cannot take his p.o. pain meds.  I have discussed with neurosurgery this morning the benefits of starting a PCA given his chronic narcotic requirements at baseline prior to surgery.  They will evaluate him this morning.  Vascular will continue to follow.  States he is passing gas.  Discussed going slow on liquids.    [name] 09/15/2019 10:31 AM --

## 2019-09-16 NOTE — Evaluation (Signed)
Occupational Therapy Evaluation Patient Details Name: Darin Gomez MRN: [id number] DOB: April 17, 1951 Today's Date: 09/16/2019    History of Present Illness 69 y.o. male, with history of hyperlipidemia and chronic back pain and bil lower extremity radiculopathy that presents 09/14/19 for L5-S1 ALIF, L3-4, L4-5 XLIF, L3-S1 PSI/PLA.  PMH includes: vertigo, neuropathy, HTN, blance disorder, s/p TKA, s/p spinal cord stimulator, s/p lumbar laminectomy    Clinical Impression   Pt admitted with above. He demonstrates the below listed deficits and will benefit from continued OT to maximize safety and independence with BADLs.  Pt was seen in conjunction with PT.  He was able to ambulate to BR with min A +2, brush teeth standing at sink with min A, and return to recliner with min A +2.  Overall, he requires min A - total  A for ADLs.  He lives with his wife, who will be available to assist him as needed at discharge.  Will follow acutely.       Follow Up Recommendations  Home health OT;Supervision/Assistance - 24 hour    Equipment Recommendations  3 in 1 bedside commode;Tub/shower bench    Recommendations for Other Services       Precautions / Restrictions Precautions Precautions: Fall;Back Precaution Booklet Issued: Yes (comment) Precaution Comments: pt able to state back precautions  Required Braces or Orthoses: Spinal Brace Spinal Brace: Lumbar corset;Applied in sitting position      Mobility Bed Mobility Overal bed mobility: Needs Assistance Bed Mobility: Sidelying to Sit   Sidelying to sit: Mod assist;+2 for safety/equipment;+2 for physical assistance       General bed mobility comments: assist to move LEs off bed and assist to lift trunk   Transfers Overall transfer level: Needs assistance Equipment used: Rolling walker (2 wheeled) Transfers: Sit to/from [employer] Transfers Sit to Stand: Min assist;+2 physical assistance;+2 safety/equipment;From elevated  surface Stand pivot transfers: Min assist;+2 physical assistance;+2 safety/equipment       General transfer comment: Pt requires cues for hand placement and proper technique as well as assist to move into standing     Balance Overall balance assessment: Needs assistance Sitting-balance support: Feet supported Sitting balance-Leahy Scale: Fair     Standing balance support: During functional activity;Single extremity supported Standing balance-Leahy Scale: Poor Standing balance comment: reliant on UE support                            ADL either performed or assessed with clinical judgement   ADL Overall ADL's : Needs assistance/impaired Eating/Feeding: Set up;Sitting   Grooming: Wash/dry hands;Wash/dry face;Oral care;Brushing hair;Minimal assistance;Standing   Upper Body Bathing: Moderate assistance;Sitting   Lower Body Bathing: Maximal assistance;Sit to/from stand   Upper Body Dressing : Maximal assistance;Sitting   Lower Body Dressing: Total assistance;Sit to/from stand   Toilet Transfer: Minimal assistance;+2 for physical assistance;+2 for safety/equipment;Ambulation;Comfort height toilet;BSC;Grab bars;RW   Toileting- Clothing Manipulation and Hygiene: Maximal assistance;Sit to/from stand       Functional mobility during ADLs: Minimal assistance;+2 for safety/equipment;+2 for physical assistance;Rolling walker General ADL Comments: wife present at end of session      Vision         Perception     Praxis      Pertinent Vitals/Pain Pain Assessment: 0-10 Pain Score: 4  Pain Location: back  Pain Descriptors / Indicators: Operative site guarding;Grimacing;Guarding Pain Intervention(s): Monitored during session;Repositioned;RN gave pain meds during session     Hand Dominance Right  Extremity/Trunk Assessment Upper Extremity Assessment Upper Extremity Assessment: Overall WFL for tasks assessed   Lower Extremity Assessment Lower Extremity  Assessment: Defer to PT evaluation   Cervical / Trunk Assessment Cervical / Trunk Assessment: Other exceptions Cervical / Trunk Exceptions: s/p lumbar fusion    Communication Communication Communication: No difficulties   Cognition Arousal/Alertness: Awake/alert Behavior During Therapy: WFL for tasks assessed/performed Overall Cognitive Status: Within Functional Limits for tasks assessed                                     General Comments       Exercises     Shoulder Instructions      Home Living Family/patient expects to be discharged to:: Private residence Living Arrangements: Spouse/significant other Available Help at Discharge: Family;Available 24 hours/day Type of Home: House Home Access: Stairs to enter;Ramped entrance Entrance Stairs-Number of Steps: 3 Entrance Stairs-Rails: Left;Right;Can reach both Home Layout: One level     Bathroom Shower/Tub: Occupational psychologist: Standard Bathroom Accessibility: Yes   Home Equipment: Environmental consultant - 2 wheels;Cane - single point;Grab bars - toilet;Grab bars - tub/shower   Additional Comments: has been using cane since 2012, needs a new one      Prior Functioning/Environment Level of Independence: Needs assistance  Gait / Transfers Assistance Needed: uses SPC for ambulation  ADL's / Homemaking Assistance Needed: Pt occasionally requires dressing help, otherwise pt independent with ADLs.            OT Problem List: Decreased strength;Decreased activity tolerance;Impaired balance (sitting and/or standing);Decreased safety awareness;Decreased knowledge of use of DME or AE;Decreased knowledge of precautions;Obesity;Pain      OT Treatment/Interventions: Self-care/ADL training;Therapeutic activities;DME and/or AE instruction;Patient/family education;Balance training    OT Goals(Current goals can be found in the care plan section) Acute Rehab OT Goals Patient Stated Goal: to have less pain OT Goal  Formulation: With patient/family Time For Goal Achievement: 09/30/19 Potential to Achieve Goals: Good ADL Goals Pt Will Perform Grooming: with supervision;standing Pt Will Perform Upper Body Bathing: with set-up;sitting Pt Will Perform Lower Body Bathing: with min assist;with adaptive equipment;sit to/from stand Pt Will Perform Upper Body Dressing: with set-up;sitting Pt Will Perform Lower Body Dressing: with min assist;with adaptive equipment;sit to/from stand Pt Will Transfer to Toilet: with min guard assist;ambulating;regular height toilet;bedside commode;grab bars Pt Will Perform Toileting - Clothing Manipulation and hygiene: with min guard assist;sit to/from stand Pt Will Perform Tub/Shower Transfer: Shower transfer;with min guard assist;ambulating;tub bench;rolling walker;grab bars  OT Frequency: Min 2X/week   Barriers to D/C:            Co-evaluation PT/OT/SLP Co-Evaluation/Treatment: Yes Reason for Co-Treatment: For patient/therapist safety;To address functional/ADL transfers   OT goals addressed during session: ADL's and self-care      AM-PAC OT "6 Clicks" Daily Activity     Outcome Measure Help from another person eating meals?: A Little Help from another person taking care of personal grooming?: A Little Help from another person toileting, which includes using toliet, bedpan, or urinal?: A Lot Help from another person bathing (including washing, rinsing, drying)?: A Lot Help from another person to put on and taking off regular upper body clothing?: A Lot Help from another person to put on and taking off regular lower body clothing?: Total 6 Click Score: 13   End of Session Equipment Utilized During Treatment: Rolling walker;Back brace Nurse Communication: Mobility status  Activity Tolerance:  Patient tolerated treatment well Patient left: in chair;with call bell/phone within reach;with family/visitor present  OT Visit Diagnosis: Pain;Unsteadiness on feet  (R26.81) Pain - part of body: (back)                Time: [id number] OT Time Calculation (min): 37 min Charges:  OT General Charges $OT Visit: 1 Visit OT Evaluation $OT Eval Moderate Complexity: 1 Mod  Nilsa Nutting., OTR/L Acute Rehabilitation Services Pager [phone] Office [place], [place] 09/16/2019, 3:07 PM

## 2019-09-16 NOTE — Progress Notes (Signed)
  NEUROSURGERY PROGRESS NOTE   No issues overnight.  Pain slightly improved compared to yesterday Will try to work with PT with slight improvement in pain No new N/T/W  EXAM:  BP 139/77   Pulse 76   Temp 97.8 F (36.6 C)   Resp 12   Ht 6' (1.829 m)   Wt (!) 136.6 kg   SpO2 98%   BMI 40.84 kg/m   Awake, alert, oriented  Speech fluent, appropriate  CN grossly intact  MAEW Mild abd distention and tenderness   IMPRESSION/PLAN 69 y.o. male POD# L5-S1 ALIF, L3-4, L4-5 XLIF, L3-S1 PSI/PLA. Significant back/hip pain although improved compared to yesterday - continue supportive care - he will try working with PT/OT today

## 2019-09-16 NOTE — Evaluation (Signed)
Physical Therapy Evaluation Patient Details Name: Darin Gomez MRN: [id number] DOB: April 13, 1951 Today's Date: 09/16/2019   History of Present Illness  69 y.o. male, with history of hyperlipidemia and chronic back pain and bil lower extremity radiculopathy that presents 09/14/19 for L5-S1 ALIF, L3-4, L4-5 XLIF, L3-S1 PSI/PLA.  PMH includes: vertigo, neuropathy, HTN, blance disorder, s/p TKA, s/p spinal cord stimulator, s/p lumbar laminectomy   Clinical Impression   Pt admitted with above diagnosis and s/p back surgery. Patient was able to decrease pain from 7/10 at beginning of session to 4/10 (was given IV pain meds just prior to start of session). He reports he has suffered from vertigo his entire life (has not ever seen an ENT or PT to address vertigo). Due to his size and dizziness, he required 2 person assist to walk 25 ft with RW. Pt currently with functional limitations due to the deficits listed below (see PT Problem List). Pt will benefit from skilled PT to increase their independence and safety with mobility to allow discharge to the venue listed below.       Follow Up Recommendations Outpatient PT(for vertigo; once cleared by neurosurgery)    Equipment Recommendations  Other (comment)(pt wants new/specific cane; rec get Rx from MD to take to DM)    Recommendations for Other Services       Precautions / Restrictions Precautions Precautions: Fall;Back Precaution Booklet Issued: Yes (comment) Precaution Comments: pt able to state back precautions; tactile cues x2 while rolling to prevent twisting Required Braces or Orthoses: Spinal Brace Spinal Brace: Lumbar corset;Applied in sitting position      Mobility  Bed Mobility Overal bed mobility: Needs Assistance Bed Mobility: Sidelying to Sit   Sidelying to sit: Mod assist;+2 for safety/equipment;+2 for physical assistance       General bed mobility comments: assist to move LEs off bed and assist to lift trunk    Transfers Overall transfer level: Needs assistance Equipment used: Rolling walker (2 wheeled) Transfers: Sit to/from Stand Sit to Stand: Min assist;+2 physical assistance;+2 safety/equipment;From elevated surface(bed set at approximate height of home setup) Stand pivot transfers: Min assist;+2 physical assistance;+2 safety/equipment       General transfer comment: Pt requires cues for hand placement and proper technique as well as assist to move into standing   Ambulation/Gait Ambulation/Gait assistance: Min assist;+2 physical assistance;+2 safety/equipment Gait Distance (Feet): 25 Feet Assistive device: Rolling walker (2 wheeled) Gait Pattern/deviations: Step-to pattern;Decreased stride length;Shuffle     General Gait Details: painful short, shuffling steps; vc for safety over sloped thrreshold; vc for approach to sit on recliner and completing turn before reaching to armrest  Stairs            Wheelchair Mobility    Modified Rankin (Stroke Patients Only)       Balance Overall balance assessment: Needs assistance Sitting-balance support: Feet supported Sitting balance-Leahy Scale: Fair     Standing balance support: During functional activity;Single extremity supported Standing balance-Leahy Scale: Poor Standing balance comment: reliant on UE support                              Pertinent Vitals/Pain Pain Assessment: 0-10 Pain Score: 4  Pain Location: back  Pain Descriptors / Indicators: Operative site guarding;Grimacing;Guarding Pain Intervention(s): Limited activity within patient's tolerance;Monitored during session;Premedicated before session;Repositioned    Home Living Family/patient expects to be discharged to:: Private residence Living Arrangements: Spouse/significant other Available Help at Discharge: Family;Available 24  hours/day Type of Home: House Home Access: Stairs to enter;Ramped entrance Entrance Stairs-Rails: Left;Right;Can  reach both Entrance Stairs-Number of Steps: 3 Home Layout: One level Home Equipment: Walker - 2 wheels;Cane - single point;Grab bars - toilet;Grab bars - tub/shower Additional Comments: has been using cane since 2012, needs a new one    Prior Function Level of Independence: Needs assistance   Gait / Transfers Assistance Needed: uses SPC for ambulation   ADL's / Homemaking Assistance Needed: Pt occasionally requires dressing help, otherwise pt independent with ADLs.        Hand Dominance   Dominant Hand: Right    Extremity/Trunk Assessment   Upper Extremity Assessment Upper Extremity Assessment: Defer to OT evaluation    Lower Extremity Assessment Lower Extremity Assessment: (+radiculopathy)    Cervical / Trunk Assessment Cervical / Trunk Assessment: Other exceptions Cervical / Trunk Exceptions: s/p anterior-posterior lumbar fusion   Communication   Communication: No difficulties  Cognition Arousal/Alertness: Awake/alert Behavior During Therapy: WFL for tasks assessed/performed Overall Cognitive Status: Within Functional Limits for tasks assessed                                        General Comments General comments (skin integrity, edema, etc.): Daughter present; wife arrived during session    Exercises     Assessment/Plan    PT Assessment Patient needs continued PT services  PT Problem List Decreased activity tolerance;Decreased balance;Decreased mobility;Decreased knowledge of use of DME;Decreased knowledge of precautions;Obesity;Pain       PT Treatment Interventions DME instruction;Gait training;Functional mobility training;Therapeutic activities;Patient/family education    PT Goals (Current goals can be found in the Care Plan section)  Acute Rehab PT Goals Patient Stated Goal: to have less pain PT Goal Formulation: With patient/family Time For Goal Achievement: 09/30/19 Potential to Achieve Goals: Good    Frequency Min 5X/week    Barriers to discharge        Co-evaluation PT/OT/SLP Co-Evaluation/Treatment: Yes Reason for Co-Treatment: For patient/therapist safety;To address functional/ADL transfers PT goals addressed during session: Mobility/safety with mobility;Proper use of DME OT goals addressed during session: ADL's and self-care       AM-PAC PT "6 Clicks" Mobility  Outcome Measure Help needed turning from your back to your side while in a flat bed without using bedrails?: A Little Help needed moving from lying on your back to sitting on the side of a flat bed without using bedrails?: A Lot Help needed moving to and from a bed to a chair (including a wheelchair)?: A Little Help needed standing up from a chair using your arms (e.g., wheelchair or bedside chair)?: A Little Help needed to walk in hospital room?: A Lot Help needed climbing 3-5 steps with a railing? : A Lot 6 Click Score: 15    End of Session Equipment Utilized During Treatment: Back brace Activity Tolerance: Patient tolerated treatment well Patient left: in chair;with call bell/phone within reach(RN reported no chair alarm needed; pt a&ox4) Nurse Communication: Mobility status PT Visit Diagnosis: Other abnormalities of gait and mobility (R26.89);Pain Pain - Right/Left: (central) Pain - part of body: (back)    Time: 1400-1444 PT Time Calculation (min) (ACUTE ONLY): 44 min   Charges:   PT Evaluation $PT Eval Low Complexity: 1 Low           Arby Barrette, PT Pager [phone]   Rexanne Mano 09/16/2019, 4:20 PM

## 2019-09-16 NOTE — Progress Notes (Signed)
[facility] [place]  Subjective  -back and hip pain better controlled today.  Tolerating some clear liquids.  Passing gas.  No abdominal pain.   Objective 139/77 76 97.8 F (36.6 C) 12 98%  Intake/Output Summary (Last 24 hours) at 09/16/2019 0957 Last data filed at 09/16/2019 0700 Gross per 24 hour  Intake 2326.5 ml  Output 525 ml  Net 1801.5 ml    Probable left DP pulse Abdominal distention but soft no rebound or guarding  Laboratory Lab Results: Recent Labs    09/15/19 0531  WBC 11.1*  HGB 12.5*  HCT 39.9  PLT 254   BMET Recent Labs    09/15/19 0531  NA 137  K 3.5  CL 101  CO2 27  GLUCOSE 137*  BUN 14  CREATININE 1.09  CALCIUM 7.6*    COAG Lab Results  Component Value Date   INR 1.1 03/01/2014   INR 1.06 12/05/2010   No results found for: PTT  Assessment/Planning: POD #2 status post L5-S1 ALIF.  Pain appears improved today and no significant abdominal pain although he remains distended.  He is passing gas and hopeful this will improve as his mobility improves.  Nice palpable pulse in the left foot.  Vascular surgery will continue to follow.  [name] 09/16/2019 9:57 AM --

## 2019-09-17 ENCOUNTER — Encounter: Payer: Self-pay | Admitting: *Deleted

## 2019-09-17 ENCOUNTER — Inpatient Hospital Stay (HOSPITAL_COMMUNITY): Payer: Medicare Other

## 2019-09-17 MED ORDER — METHYLNALTREXONE BROMIDE 12 MG/0.6ML ~~LOC~~ SOLN
12.0000 mg | Freq: Once | SUBCUTANEOUS | Status: AC
  Administered 2019-09-17: 12 mg via SUBCUTANEOUS
  Filled 2019-09-17: qty 0.6

## 2019-09-17 NOTE — Progress Notes (Signed)
  NEUROSURGERY PROGRESS NOTE   No issues overnight.  Pain continues to slowly improve No new N/T/W Getting ready to use restroom to have BM  EXAM:  BP (!) 145/80 (BP Location: Right Arm)   Pulse 71   Temp 98.2 F (36.8 C) (Oral)   Resp 17   Ht 6' (1.829 m)   Wt (!) 136.6 kg   SpO2 99%   BMI 40.84 kg/m   Awake, alert, oriented  Speech fluent, appropriate  CN grossly intact  MAEW Mild abd distention and tenderness Incisions: c/d/i  IMPRESSION/PLAN 69 y.o. male POD#3 L5-S1 ALIF, L3-4, L4-5 XLIF, L3-S1 PSI/PLA. Significant back/hip pain although improved compared to yesterday - continue supportive care, PT/OT - bowel regimen started by vascular. Actually up to use restroom this am when I walked in. Will monitor. KUB ordered.

## 2019-09-17 NOTE — Progress Notes (Signed)
I discussed patient's current clinical presentation of post-op ileus as well as history of bowel dysmotility secondary to medications with hospital pharmacist.  The patient states he has used Relistor orally in the past.  Oral form not available in hospital pharmacy. Recommended dose of 12 mg subcutaneously. Will order  One dose this morning

## 2019-09-17 NOTE — Progress Notes (Signed)
Physical Therapy Treatment Patient Details Name: Darin Gomez MRN: [id number] DOB: Oct 18, 1950 Today's Date: 09/17/2019    History of Present Illness 69 y.o. male, with history of hyperlipidemia and chronic back pain and bil lower extremity radiculopathy that presents 09/14/19 for L5-S1 ALIF, L3-4, L4-5 XLIF, L3-S1 PSI/PLA.  PMH includes: vertigo, neuropathy, HTN, blance disorder, s/p TKA, s/p spinal cord stimulator, s/p lumbar laminectomy     PT Comments    Pt was up in the chair already this AM, brace not donned.  He was ready to return to bed due to pain from being up when I came in, declined ambulation.  Pt assisted pt back to bed in comfortable L side lying position.  Pillows for comfort, reviewed brace use and back precautions verbally with pt, has handout in room.  O2 sats 80% on RA during transfers (removed for transfers) re-applied [place] after transfer. PT will continue to follow acutely for safe mobility progression   Follow Up Recommendations  Home health PT;Supervision for mobility/OOB     Equipment Recommendations  Other (comment)(pt wants new/specific cane, rec MD write prescription )    Recommendations for Other Services   NA     Precautions / Restrictions Precautions Precautions: Fall;Back Precaution Booklet Issued: Yes (comment) Precaution Comments: pt able to report 3/3 back precautions, reinforced back brace use as he did not have it on when he got up earlier today.  Required Braces or Orthoses: Spinal Brace Spinal Brace: Lumbar corset;Applied in sitting position    Mobility  Bed Mobility Overal bed mobility: Needs Assistance Bed Mobility: Sit to Sidelying;Rolling Rolling: Min assist       Sit to sidelying: Mod assist General bed mobility comments: Mod assist to help lift both legs back into bed with reverse log roll technique.  Min assit to roll to left side (more comfortable side) for comfort.   Transfers Overall transfer level: Needs  assistance Equipment used: Rolling walker (2 wheeled) Transfers: Sit to/from Stand Sit to Stand: Min assist Stand pivot transfers: Min assist       General transfer comment: Heavy min assist to stand from low recliner chair, coming up over flexed knees relying heavily on hands to support legs.  Pt reports bil feet numbness. Lighter min assist for pivotal steps around to the bed.  Declined walking due to pain.       Balance Overall balance assessment: Needs assistance Sitting-balance support: Feet supported;Bilateral upper extremity supported Sitting balance-Leahy Scale: Fair Sitting balance - Comments: using bil UEs to unweight back on RW or bed   Standing balance support: Bilateral upper extremity supported Standing balance-Leahy Scale: Poor Standing balance comment: reliant on UE support stood >5 mins to attempt to urinate.                            Cognition Arousal/Alertness: Awake/alert Behavior During Therapy: WFL for tasks assessed/performed Overall Cognitive Status: Within Functional Limits for tasks assessed                                           General Comments General comments (skin integrity, edema, etc.): Pt desaturating on RA to 80%, so O2 re applied after transfer back to bed.       Pertinent Vitals/Pain Pain Assessment: Faces Faces Pain Scale: Hurts worst Pain Location: back  Pain Descriptors / Indicators: Operative  site guarding;Grimacing;Guarding Pain Intervention(s): Limited activity within patient's tolerance;Monitored during session;Repositioned        PT Goals (current goals can now be found in the care plan section) Acute Rehab PT Goals Patient Stated Goal: to have less pain Progress towards PT goals: Not progressing toward goals - comment(limited by pain after being up in the chair a while)    Frequency    Min 5X/week      PT Plan Current plan remains appropriate       AM-PAC PT "6 Clicks" Mobility    Outcome Measure  Help needed turning from your back to your side while in a flat bed without using bedrails?: A Little Help needed moving from lying on your back to sitting on the side of a flat bed without using bedrails?: A Lot Help needed moving to and from a bed to a chair (including a wheelchair)?: A Little Help needed standing up from a chair using your arms (e.g., wheelchair or bedside chair)?: A Little Help needed to walk in hospital room?: A Lot Help needed climbing 3-5 steps with a railing? : A Lot 6 Click Score: 15    End of Session Equipment Utilized During Treatment: Oxygen Activity Tolerance: Patient limited by pain Patient left: in bed;with call bell/phone within reach   PT Visit Diagnosis: Other abnormalities of gait and mobility (R26.89);Pain Pain - Right/Left: (incisional) Pain - part of body: (back)     Time: [id number] PT Time Calculation (min) (ACUTE ONLY): 20 min  Charges:  $Therapeutic Activity: 8-22 mins            Verdene Lennert, PT, DPT  Acute Rehabilitation [phone] pager #[phone] office               09/17/2019, 10:24 AM

## 2019-09-17 NOTE — Progress Notes (Addendum)
Progress Note    09/17/2019 7:15 AM 3 Days Post-Op  Subjective:  Difficulty sleeping due to post-op pain.  "Felt sick all night", but no vomiting. Says he is no longer passing gas. Denies abd pain.    Vitals:   09/17/19 0025 09/17/19 0425  BP: 131/61 134/61  Pulse: 79 81  Resp: 16 16  Temp: 97.9 F (36.6 C) 98.3 F (36.8 C)  SpO2: 96% 96%    Physical Exam: Cardiac:  RRR Lungs:  CTA bil Incisions:  Dressings dry and intact Extremities:  2+ DP pulses  bil Abdomen:  Distended but pliable.  Bowels sound c/w gurgles and rushes  UOP: 950cc  CBC    Component Value Date/Time   WBC 11.1 (H) 09/15/2019 0531   RBC 4.20 (L) 09/15/2019 0531   HGB 12.5 (L) 09/15/2019 0531   HCT 39.9 09/15/2019 0531   PLT 254 09/15/2019 0531   MCV 95.0 09/15/2019 0531   MCH 29.8 09/15/2019 0531   MCHC 31.3 09/15/2019 0531   RDW 13.5 09/15/2019 0531   RDW 15.0 03/01/2014 1452   LYMPHSABS 2.1 03/01/2014 1452   MONOABS 1.1 (H) 12/05/2010 1524   EOSABS 0.1 03/01/2014 1452   BASOSABS 0.0 03/01/2014 1452    BMET    Component Value Date/Time   NA 137 09/15/2019 0531   NA 139 03/01/2014 1452   K 3.5 09/15/2019 0531   CL 101 09/15/2019 0531   CO2 27 09/15/2019 0531   GLUCOSE 137 (H) 09/15/2019 0531   BUN 14 09/15/2019 0531   BUN 9 03/01/2014 1452   CREATININE 1.09 09/15/2019 0531   CALCIUM 7.6 (L) 09/15/2019 0531   GFRNONAA >60 09/15/2019 0531   GFRAA >60 09/15/2019 0531     Intake/Output Summary (Last 24 hours) at 09/17/2019 0715 Last data filed at 09/17/2019 0519 Gross per 24 hour  Intake --  Output 700 ml  Net -700 ml    HOSPITAL MEDICATIONS Scheduled Meds: . baclofen  20 mg Oral TID  . docusate sodium  100 mg Oral BID  . lisinopril  10 mg Oral Daily   And  . hydrochlorothiazide  12.5 mg Oral Daily  . loratadine  10 mg Oral Daily  . melatonin  9 mg Oral QHS  . morphine  30 mg Oral Q12H  . multivitamin  1 tablet Oral Daily  . pantoprazole (PROTONIX) IV  40 mg  Intravenous QHS  . polyethylene glycol  17 g Oral BID  . pravastatin  80 mg Oral QHS  . pregabalin  150 mg Oral BID  . pregabalin  75 mg Oral q morning - 10a  . sodium chloride flush  3 mL Intravenous Q12H  . Vitamin D (Ergocalciferol)  50,000 Units Oral Once per day on Mon Thu   Continuous Infusions: . sodium chloride    . dextrose 5 % and 0.45 % NaCl with KCl 20 mEq/L 75 mL/hr at 09/17/19 0102  . methocarbamol (ROBAXIN) IV     PRN Meds:.acetaminophen, alum & mag hydroxide-simeth, bisacodyl, HYDROcodone-acetaminophen, HYDROmorphone (DILAUDID) injection, meclizine, menthol-cetylpyridinium **OR** phenol, methocarbamol **OR** methocarbamol (ROBAXIN) IV, nitroGLYCERIN, nystatin cream, ondansetron **OR** ondansetron (ZOFRAN) IV, oxyCODONE, polyethylene glycol, promethazine, simethicone, sodium chloride flush, sodium phosphate, traMADol, zolpidem  Assessment:  69 y.o. male is s/p: L5-SI ALIF exposure. Post-operative ileus VSS, afebrile. H and H stable and normal electrolytes    3 Days Post-Op  Plan: -Consider Relistor.  He has had to use this in the past secondary to medication side effects. Continue to be OOB  and mobilize as much as possible -DVT prophylaxis:  SCDs   Risa Grill, PA-C [facility] [phone] 09/17/2019  7:15 AM   I have seen and evaluated the patient. I agree with the PA note as documented above.  Postop day 3 status post anterior spine exposure at L5-S1.  He has significant abdominal distention and is very tympanic on exam.  No significant abdominal pain.  Looks well overall.  Vitals are stable.  He is on Relistor  at home for chronic constipation from narcotics and that has not been restarted.  Will discuss with pharmacy to get his Relistor restarted.  We will also give him suppository.  I have order upright KUB but expect this is just expected postop course given his chronic dysmotility from narcotic use and not using his Relistor.  [name], MD [facility] [place] Office: [phone]

## 2019-09-18 MED ORDER — MORPHINE SULFATE ER 15 MG PO TBCR
30.0000 mg | EXTENDED_RELEASE_TABLET | Freq: Two times a day (BID) | ORAL | Status: DC
  Administered 2019-09-18 – 2019-09-23 (×10): 30 mg via ORAL
  Filled 2019-09-18 (×10): qty 2

## 2019-09-18 MED ORDER — DEXAMETHASONE SODIUM PHOSPHATE 4 MG/ML IJ SOLN
4.0000 mg | Freq: Four times a day (QID) | INTRAMUSCULAR | Status: DC
  Administered 2019-09-18 – 2019-09-22 (×16): 4 mg via INTRAVENOUS
  Filled 2019-09-18 (×16): qty 1

## 2019-09-18 MED ORDER — PANTOPRAZOLE SODIUM 40 MG IV SOLR
40.0000 mg | Freq: Every day | INTRAVENOUS | Status: DC
  Administered 2019-09-18 – 2019-09-19 (×2): 40 mg via INTRAVENOUS
  Filled 2019-09-18 (×2): qty 40

## 2019-09-18 MED ORDER — METHYLNALTREXONE BROMIDE 12 MG/0.6ML ~~LOC~~ SOLN
12.0000 mg | Freq: Once | SUBCUTANEOUS | Status: DC
  Filled 2019-09-18: qty 0.6

## 2019-09-18 MED ORDER — BACLOFEN 10 MG PO TABS
20.0000 mg | ORAL_TABLET | Freq: Three times a day (TID) | ORAL | Status: DC
  Administered 2019-09-18 – 2019-09-23 (×14): 20 mg via ORAL
  Filled 2019-09-18 (×14): qty 2

## 2019-09-18 MED ORDER — LISINOPRIL 10 MG PO TABS
10.0000 mg | ORAL_TABLET | Freq: Every day | ORAL | Status: DC
  Administered 2019-09-19 – 2019-09-23 (×5): 10 mg via ORAL
  Filled 2019-09-18 (×5): qty 1

## 2019-09-18 MED ORDER — PREGABALIN 75 MG PO CAPS
75.0000 mg | ORAL_CAPSULE | Freq: Every morning | ORAL | Status: DC
  Administered 2019-09-19 – 2019-09-23 (×5): 75 mg via ORAL
  Filled 2019-09-18 (×6): qty 1

## 2019-09-18 MED ORDER — MELATONIN 3 MG PO TABS
9.0000 mg | ORAL_TABLET | Freq: Every day | ORAL | Status: DC
  Administered 2019-09-18 – 2019-09-23 (×5): 9 mg via ORAL
  Filled 2019-09-18 (×5): qty 3

## 2019-09-18 MED ORDER — PROSIGHT PO TABS
1.0000 | ORAL_TABLET | Freq: Every day | ORAL | Status: DC
  Administered 2019-09-19 – 2019-09-23 (×5): 1 via ORAL
  Filled 2019-09-18 (×5): qty 1

## 2019-09-18 MED ORDER — LORATADINE 10 MG PO TABS
10.0000 mg | ORAL_TABLET | Freq: Every day | ORAL | Status: DC
  Administered 2019-09-19 – 2019-09-23 (×5): 10 mg via ORAL
  Filled 2019-09-18 (×5): qty 1

## 2019-09-18 MED ORDER — HYDROCHLOROTHIAZIDE 12.5 MG PO CAPS
12.5000 mg | ORAL_CAPSULE | Freq: Every day | ORAL | Status: DC
  Administered 2019-09-19 – 2019-09-23 (×5): 12.5 mg via ORAL
  Filled 2019-09-18 (×5): qty 1

## 2019-09-18 NOTE — Progress Notes (Signed)
Physical Therapy Treatment Patient Details Name: Darin Gomez MRN: [id number] DOB: 21-Apr-1951 Today's Date: 09/18/2019    History of Present Illness 69 y.o. male, with history of hyperlipidemia and chronic back pain and bil lower extremity radiculopathy that presents 09/14/19 for L5-S1 ALIF, L3-4, L4-5 XLIF, L3-S1 PSI/PLA.  PMH includes: vertigo, neuropathy, HTN, blance disorder, s/p TKA, s/p spinal cord stimulator, s/p lumbar laminectomy     PT Comments    Patient progressing with ambulation in hallway and able to stand to brush his teeth with one UE supported.  Still with LE weakness and long history of decreased sensation per pt.  He will continue to benefit from skilled PT in the acute setting and from follow up [place] at d/c.    Follow Up Recommendations  Home health PT;Supervision for mobility/OOB     Equipment Recommendations  Other (comment)(cane, specifics per pt)    Recommendations for Other Services       Precautions / Restrictions Precautions Precautions: Fall;Back Required Braces or Orthoses: Spinal Brace Spinal Brace: Lumbar corset;Applied in sitting position    Mobility  Bed Mobility Overal bed mobility: Needs Assistance Bed Mobility: Sidelying to Sit Rolling: Supervision         General bed mobility comments: able to come upright with rail and S, already on L side in bed  Transfers Overall transfer level: Needs assistance Equipment used: Rolling walker (2 wheeled) Transfers: Sit to/from Stand Sit to Stand: Min guard         General transfer comment: for balance from bed with one hand on footboard and one on walker  Ambulation/Gait Ambulation/Gait assistance: Supervision;Min guard Gait Distance (Feet): 100 Feet Assistive device: Rolling walker (2 wheeled) Gait Pattern/deviations: Step-to pattern;Decreased stride length;Step-through pattern;Shuffle;Trunk flexed     General Gait Details: short shuffling steps with heavy UE support on  walker   Stairs             Wheelchair Mobility    Modified Rankin (Stroke Patients Only)       Balance Overall balance assessment: Needs assistance Sitting-balance support: Feet supported Sitting balance-Leahy Scale: Good     Standing balance support: Bilateral upper extremity supported;Single extremity supported Standing balance-Leahy Scale: Poor Standing balance comment: UE support at least one hand while brushing teeth at sink, assist to spit in cup                            Cognition Arousal/Alertness: Awake/alert Behavior During Therapy: WFL for tasks assessed/performed Overall Cognitive Status: Within Functional Limits for tasks assessed                                        Exercises      General Comments General comments (skin integrity, edema, etc.): SpO2 89-90% on RA after ambulation up in chair, left off O2 but sensor on      Pertinent Vitals/Pain Pain Score: 5  Pain Location: back  Pain Descriptors / Indicators: Operative site guarding;Grimacing;Guarding Pain Intervention(s): Monitored during session;Repositioned;Limited activity within patient's tolerance    Home Living                      Prior Function            PT Goals (current goals can now be found in the care plan section) Progress towards PT goals: Progressing toward  goals    Frequency    Min 5X/week      PT Plan Current plan remains appropriate    Co-evaluation              AM-PAC PT "6 Clicks" Mobility   Outcome Measure  Help needed turning from your back to your side while in a flat bed without using bedrails?: None Help needed moving from lying on your back to sitting on the side of a flat bed without using bedrails?: None Help needed moving to and from a bed to a chair (including a wheelchair)?: A Little Help needed standing up from a chair using your arms (e.g., wheelchair or bedside chair)?: A Little Help needed to  walk in hospital room?: A Little Help needed climbing 3-5 steps with a railing? : A Lot 6 Click Score: 19    End of Session Equipment Utilized During Treatment: Back brace Activity Tolerance: Patient tolerated treatment well Patient left: in chair;with call bell/phone within reach;with family/visitor present   PT Visit Diagnosis: Other abnormalities of gait and mobility (R26.89);Pain Pain - part of body: (back)     Time: 1515-1540 PT Time Calculation (min) (ACUTE ONLY): 25 min  Charges:  $Gait Training: 8-22 mins $Therapeutic Activity: 8-22 mins                     Darin Gomez, [place] [facility] [phone] 09/18/2019    Darin Gomez 09/18/2019, 5:09 PM

## 2019-09-18 NOTE — Progress Notes (Addendum)
Pt and daughter concerned about current medication regimen and POC. Primary MD office notified, spoke with MA who will pass onto MD covering.

## 2019-09-18 NOTE — Progress Notes (Signed)
Pt's nausea and dizziness appears to be more controlled after administering prn meclizine x2 today. Pt reports it "being non-existent." Pain is also more controlled without using IV dilaudid. Patient rating 4/10 since this afternoon.

## 2019-09-18 NOTE — Progress Notes (Addendum)
  Progress Note    09/18/2019 7:03 AM 4 Days Post-Op  Subjective:  Kept drifting off to sleep while trying to talk with him this morning. Says he slept better last night then he has. Says he is passing gas but no BM. Not complaining of any abdominal pain. Says he did have some vomiting but not able to tell me when  Vitals:   09/17/19 2349 09/18/19 0341  BP: (!) 160/73 (!) 120/56  Pulse: 87 60  Resp: 20 20  Temp: 98.1 F (36.7 C) 98.2 F (36.8 C)  SpO2: 93% 98%   Physical Exam: General: very somnolent, not in any acute distress Cardiac:  Regular rate and rhythm Lungs:  Non labored Incisions: dry and intact, a lot of ecchymosis around lumbar incisions Extremities:  Moving all extremities,2+ distal pulses bilaterally Abdomen:  Distended, minimal tenderness Neurologic: somnolent but awakes to verbal stimulus   CBC    Component Value Date/Time   WBC 11.1 (H) 09/15/2019 0531   RBC 4.20 (L) 09/15/2019 0531   HGB 12.5 (L) 09/15/2019 0531   HCT 39.9 09/15/2019 0531   PLT 254 09/15/2019 0531   MCV 95.0 09/15/2019 0531   MCH 29.8 09/15/2019 0531   MCHC 31.3 09/15/2019 0531   RDW 13.5 09/15/2019 0531   RDW 15.0 03/01/2014 1452   LYMPHSABS 2.1 03/01/2014 1452   MONOABS 1.1 (H) 12/05/2010 1524   EOSABS 0.1 03/01/2014 1452   BASOSABS 0.0 03/01/2014 1452    BMET    Component Value Date/Time   NA 137 09/15/2019 0531   NA 139 03/01/2014 1452   K 3.5 09/15/2019 0531   CL 101 09/15/2019 0531   CO2 27 09/15/2019 0531   GLUCOSE 137 (H) 09/15/2019 0531   BUN 14 09/15/2019 0531   BUN 9 03/01/2014 1452   CREATININE 1.09 09/15/2019 0531   CALCIUM 7.6 (L) 09/15/2019 0531   GFRNONAA >60 09/15/2019 0531   GFRAA >60 09/15/2019 0531    INR    Component Value Date/Time   INR 1.1 03/01/2014 1452     Intake/Output Summary (Last 24 hours) at 09/18/2019 0703 Last data filed at 09/17/2019 2300 Gross per 24 hour  Intake 2567.7 ml  Output 2300 ml  Net 267.7 ml      Assessment/Plan:  69 y.o. male is s/ L5-S1 ALIF exposure 4 Days Post-Op. Post op ileus. No significant abdominal pain but very distended. Afebrile. Relistor sq was ordered as well as suppository yesterday. Passing gas but no BM. KUB with minimal bowel gas seen, no acute findings. Continue oob and mobilize      Karoline Caldwell, [facility] [phone] 09/18/2019 7:03 AM   I have seen and evaluated the patient. I agree with the PA note as documented above. POD#3 s/p L5-S1 ALIF.  Had large BM today after relistor dose yesterday.  Takes this at home on ongoing basis.  Will order another dose today.  Distended, but no significant abdominal pain.  KUB yesterday with nothing concerning.  All back and hip pain now.  Hopefully continues to make improvement.  [name], MD [facility] [place] Office: [phone]

## 2019-09-18 NOTE — Plan of Care (Signed)
  Problem: Education: Goal: Knowledge of General Education information will improve Description: Including pain rating scale, medication(s)/side effects and non-pharmacologic comfort measures Outcome: Progressing   Problem: Clinical Measurements: Goal: Ability to maintain clinical measurements within normal limits will improve Outcome: Progressing Goal: Will remain free from infection Outcome: Progressing Goal: Diagnostic test results will improve Outcome: Progressing Goal: Respiratory complications will improve Outcome: Progressing Note: Titrated O2 down    Problem: Elimination: Goal: Will not experience complications related to bowel motility Outcome: Progressing Goal: Will not experience complications related to urinary retention Outcome: Progressing

## 2019-09-18 NOTE — Progress Notes (Signed)
RN received list of medications and times that patient likes to take medications per request earlier today. Copy placed in chart and sent down to pharmacy to change the times. RN called Pharmacy to update on POC.

## 2019-09-18 NOTE — Progress Notes (Addendum)
  NEUROSURGERY PROGRESS NOTE   Patient and wife feel as though his care last night was suboptimal. He is in severe pain today, although continues to report slow, gradual improvement. No new n/t/w in extremities. Has had multiple BMs.  EXAM:  BP (!) 147/70 (BP Location: Right Arm)   Pulse 73   Temp 98.3 F (36.8 C) (Oral)   Resp 16   Ht 6' (1.829 m)   Wt (!) 136.6 kg   SpO2 98%   BMI 40.84 kg/m   Awake, alert, oriented  Speech fluent, appropriate  CN grossly intact  MAEW Mild abd distention and tenderness Incisions: c/d/i  IMPRESSION/PLAN 69 y.o. male POD#4 L5-S1 ALIF, L3-4, L4-5 XLIF, L3-S1 PSI/PLA. Slow steady improvement in pain and mobility.  - continue supportive care, PT/OT - KUB negative for ileus. Has had multiple BMs - will allow wife to spend night to help patient. Communicated with nursing.  - I appreciate nursing's care of this patient. - added decadron

## 2019-09-18 NOTE — Progress Notes (Signed)
Attempted to see pt this am.  Pt and wife with multiple concerns regarding care in the middle of the night.  Pt in too much pain and too tired to participate this am despite encouragement to do so. Will see back as able. Jinger Neighbors, OtR/L [id number]

## 2019-09-19 LAB — CBC WITH DIFFERENTIAL/PLATELET
Abs Immature Granulocytes: 0.02 10*3/uL (ref 0.00–0.07)
Basophils Absolute: 0 10*3/uL (ref 0.0–0.1)
Basophils Relative: 0 %
Eosinophils Absolute: 0 10*3/uL (ref 0.0–0.5)
Eosinophils Relative: 0 %
HCT: 42.4 % (ref 39.0–52.0)
Hemoglobin: 13.4 g/dL (ref 13.0–17.0)
Immature Granulocytes: 0 %
Lymphocytes Relative: 6 %
Lymphs Abs: 0.4 10*3/uL — ABNORMAL LOW (ref 0.7–4.0)
MCH: 29.8 pg (ref 26.0–34.0)
MCHC: 31.6 g/dL (ref 30.0–36.0)
MCV: 94.2 fL (ref 80.0–100.0)
Monocytes Absolute: 0.4 10*3/uL (ref 0.1–1.0)
Monocytes Relative: 6 %
Neutro Abs: 6.4 10*3/uL (ref 1.7–7.7)
Neutrophils Relative %: 88 %
Platelets: 308 10*3/uL (ref 150–400)
RBC: 4.5 MIL/uL (ref 4.22–5.81)
RDW: 12.6 % (ref 11.5–15.5)
WBC: 7.2 10*3/uL (ref 4.0–10.5)
nRBC: 0 % (ref 0.0–0.2)

## 2019-09-19 MED ORDER — OXYCODONE HCL 5 MG PO TABS
10.0000 mg | ORAL_TABLET | ORAL | Status: DC | PRN
  Administered 2019-09-19 – 2019-09-23 (×16): 10 mg via ORAL
  Filled 2019-09-19 (×17): qty 2

## 2019-09-19 MED ORDER — METHYLNALTREXONE BROMIDE 12 MG/0.6ML ~~LOC~~ SOLN
12.0000 mg | Freq: Once | SUBCUTANEOUS | Status: DC
  Filled 2019-09-19: qty 0.6

## 2019-09-19 MED ORDER — CEFAZOLIN SODIUM-DEXTROSE 2-4 GM/100ML-% IV SOLN
2.0000 g | Freq: Three times a day (TID) | INTRAVENOUS | Status: DC
  Administered 2019-09-19 – 2019-09-23 (×12): 2 g via INTRAVENOUS
  Filled 2019-09-19 (×14): qty 100

## 2019-09-19 NOTE — Progress Notes (Addendum)
  NEUROSURGERY PROGRESS NOTE   No issues overnight.  Pain continues to improve. No longer requiring IV pain meds No new N/T/W Would like to shower  EXAM:  BP (!) 152/85 (BP Location: Right Arm)   Pulse 62   Temp 98.2 F (36.8 C) (Oral)   Resp 18   Ht 6' (1.829 m)   Wt (!) 136.6 kg   SpO2 99%   BMI 40.84 kg/m   Awake, alert, oriented  Speech fluent, appropriate  CN grossly intact  MAEW, nonfocal Xlif/posterior incision: c/d/i abd incision: c/d/i, surrounding redness to umbilicus. no tenderness to palpation.  IMPRESSION/PLAN 69 y.o. male POD#5L5-S1 ALIF, L3-4, L4-5 XLIF, L3-S1 PSI/PLA. Slow steady improvement in pain and mobility.   - abdominal redness: CBC will not be helpful as patient has been on steroids for the past 24 hours. Would be unusual for cellulitis this early on. Will monitor closely. Vascular to start abx. - continue supportive care, PT/OT - continue decadron, avoid IV pain meds - Hopeful for discharge end of week

## 2019-09-19 NOTE — Progress Notes (Signed)
Occupational Therapy Treatment Patient Details Name: Darin Gomez MRN: [id number] DOB: [birth year] Today's Date: 09/19/2019    History of present illness 69 y.o. male, with history of hyperlipidemia and chronic back pain and bil lower extremity radiculopathy that presents 09/14/19 for L5-S1 ALIF, L3-4, L4-5 XLIF, L3-S1 PSI/PLA.  PMH includes: vertigo, neuropathy, HTN, blance disorder, s/p TKA, s/p spinal cord stimulator, s/p lumbar laminectomy    OT comments  Pt up in bathroom upon arrival to room with NT assist. Pt able to perform room level mobility using RW and standing grooming ADL with minA. Pt requiring cues for carryover of back precautions during standing ADL tasks. Additional focus on educating both pt/pt's spouse re: use of AE for increasing safety and independence with ADL and functional transfers (including LB ADL and toilieting). Pt verbalizing/return demonstrating understanding throughout; spouse present for education and supportive during session. Pt returning to sidelying in bed end of session due to increased lethargy (suspect from pain meds). Will continue per POC at this time.   Follow Up Recommendations  Home health OT;Supervision/Assistance - 24 hour    Equipment Recommendations  3 in 1 bedside commode;Tub/shower bench          Precautions / Restrictions Precautions Precautions: Fall;Back Precaution Comments: pt able to verbalize 3/3 precautions Required Braces or Orthoses: Spinal Brace Spinal Brace: Lumbar corset;Applied in sitting position Restrictions Weight Bearing Restrictions: No       Mobility Bed Mobility Overal bed mobility: Needs Assistance Bed Mobility: Sit to Sidelying   Sidelying to sit: Supervision     Sit to sidelying: Mod assist General bed mobility comments: requires assist for LEs onto EOB, pt requesting to stay in sidelying   Transfers Overall transfer level: Needs assistance Equipment used: Rolling walker (2 wheeled) Transfers:  Sit to/from Stand Sit to Stand: Min guard         General transfer comment: VCs for hand placement and minguard for safety; increased effort from lower surface heights    Balance Overall balance assessment: Needs assistance Sitting-balance support: Feet supported;Bilateral upper extremity supported Sitting balance-Leahy Scale: Good     Standing balance support: Bilateral upper extremity supported Standing balance-Leahy Scale: Poor Standing balance comment: needs external assist in standing from AD or hands.                            ADL either performed or assessed with clinical judgement   ADL Overall ADL's : Needs assistance/impaired     Grooming: Minimal assistance;Standing;Wash/dry face;Oral care Grooming Details (indicate cue type and reason): assist for balance and cues for maintaining upright position, compensatory technique for oral care (pt initially attempting to lean over and support forearms on RW)               Lower Body Dressing Details (indicate cue type and reason): educated in use of reacher and sock aide for LB dressing tasks, pt return demonstrating  Toilet Transfer: Minimal assistance;Ambulation;BSC Toilet Transfer Details (indicate cue type and reason): BSC over toilet Toileting- Clothing Manipulation and Hygiene: Maximal assistance;Sit to/from stand Toileting - Clothing Manipulation Details (indicate cue type and reason): NT assisting with pericare upon arrival; educated in [place] for increased independence during pericare      Functional mobility during ADLs: Minimal assistance;Rolling walker General ADL Comments: educated in use of AE for ADL, pt/pt's spouse present and pt verbalizing/return demonstrating understanding. Educated on resources for obtaining AE  Cognition Arousal/Alertness: Awake/alert Behavior During Therapy: WFL for tasks assessed/performed Overall Cognitive Status: Within Functional Limits  for tasks assessed                                 General Comments: sleepy towards end of session        Exercises     Shoulder Instructions       General Comments      Pertinent Vitals/ Pain       Pain Assessment: Faces Faces Pain Scale: Hurts even more Pain Location: back  Pain Descriptors / Indicators: Operative site guarding;Grimacing;Guarding Pain Intervention(s): Limited activity within patient's tolerance;Monitored during session;Repositioned  Home Living                                          Prior Functioning/Environment              Frequency  Min 2X/week        Progress Toward Goals  OT Goals(current goals can now be found in the care plan section)  Progress towards OT goals: Progressing toward goals  Acute Rehab OT Goals Patient Stated Goal: to have less pain, decrease dizziness OT Goal Formulation: With patient/family Time For Goal Achievement: 09/30/19 Potential to Achieve Goals: Good ADL Goals Pt Will Perform Grooming: with supervision;standing Pt Will Perform Upper Body Bathing: with set-up;sitting Pt Will Perform Lower Body Bathing: with min assist;with adaptive equipment;sit to/from stand Pt Will Perform Upper Body Dressing: with set-up;sitting Pt Will Perform Lower Body Dressing: with min assist;with adaptive equipment;sit to/from stand Pt Will Transfer to Toilet: with min guard assist;ambulating;regular height toilet;bedside commode;grab bars Pt Will Perform Toileting - Clothing Manipulation and hygiene: with min guard assist;sit to/from stand Pt Will Perform Tub/Shower Transfer: Shower transfer;with min guard assist;ambulating;tub bench;rolling walker;grab bars  Plan Discharge plan remains appropriate    Co-evaluation                 AM-PAC OT "6 Clicks" Daily Activity     Outcome Measure   Help from another person eating meals?: A Little Help from another person taking care of personal  grooming?: A Little Help from another person toileting, which includes using toliet, bedpan, or urinal?: A Lot Help from another person bathing (including washing, rinsing, drying)?: A Lot Help from another person to put on and taking off regular upper body clothing?: A Little Help from another person to put on and taking off regular lower body clothing?: A Lot 6 Click Score: 15    End of Session Equipment Utilized During Treatment: Rolling walker;Back brace  OT Visit Diagnosis: Pain;Unsteadiness on feet (R26.81) Pain - part of body: (back)   Activity Tolerance Patient tolerated treatment well   Patient Left in bed;with call bell/phone within reach;with family/visitor present   Nurse Communication Mobility status        Time: [id number] OT Time Calculation (min): 34 min  Charges: OT General Charges $OT Visit: 1 Visit OT Treatments $Self Care/Home Management : 23-37 mins  Lou Cal, OT Acute Rehabilitation Services Pager [phone] Office [place] 09/19/2019, 1:52 PM

## 2019-09-19 NOTE — Progress Notes (Signed)
Physical Therapy Treatment Patient Details Name: Darin Gomez MRN: [id number] DOB: 09-08-1950 Today's Date: 09/19/2019    History of Present Illness 69 y.o. male, with history of hyperlipidemia and chronic back pain and bil lower extremity radiculopathy that presents 09/14/19 for L5-S1 ALIF, L3-4, L4-5 XLIF, L3-S1 PSI/PLA.  PMH includes: vertigo, neuropathy, HTN, blance disorder, s/p TKA, s/p spinal cord stimulator, s/p lumbar laminectomy     PT Comments    Pt was able to progress gait further down the hallway despite increased vertiginous dizziness today (pt using targeting as compensation strategy in the hallway).  Pt reports long standing h/o dizziness takes meclazine at home.  Wife followed with chair today for increased safety and we sat for a few mins half way through our walk before continuing again.  Wife, Jeani Hawking, is very supportive and helpful.  May start stair training tomorrow to see if pt wants to walk further and go in at his ramp, or walk a shorter distance and go up the stairs with railing when going in the house.    Follow Up Recommendations  Home health PT;Supervision for mobility/OOB     Equipment Recommendations  Other (comment)(cane, specifics per pt)    Recommendations for Other Services   NA     Precautions / Restrictions Precautions Precautions: Fall;Back Required Braces or Orthoses: Spinal Brace Spinal Brace: Lumbar corset;Applied in sitting position    Mobility  Bed Mobility Overal bed mobility: Needs Assistance Bed Mobility: Sidelying to Sit   Sidelying to sit: Supervision       General bed mobility comments: Pt already in side lying when PT entered the room, able to come up to sitting with extra time and use of rail, but unassisted physically by PT  Transfers Overall transfer level: Needs assistance Equipment used: Rolling walker (2 wheeled) Transfers: Sit to/from Stand Sit to Stand: Min guard         General transfer comment: cues for safe  hand placement, harder from lower recliner chair than higher bed, but no more assist needed, just slower and more effortful from lower surface  Ambulation/Gait Ambulation/Gait assistance: Min assist;+2 safety/equipment(chair to follow, wife, Jeani Hawking, pushing chair) Gait Distance (Feet): 100 Feet(x2 with seated rest) Assistive device: Rolling walker (2 wheeled) Gait Pattern/deviations: Step-through pattern;Trunk flexed Gait velocity: decreased Gait velocity interpretation: <1.8 ft/sec, indicate of risk for recurrent falls General Gait Details: Pt with less reliance on hands during gait today, but reporting vertigo.  He is self cueing to target in the hallway, and RN bringing meclazine at end of session.  Pt says he has h/o vertigo, always has meclazine, and has seen an ENT in the past for it.     Stairs Stairs: Yes       General stair comments: Discussed home entry steps.  He says if he has to he can go up a ramp and into the kitchen with no stairs, but the walk is longer, or he can go up 3 stairs with a rail if he feels up to it.  May start practicing stairs tomorrow to see if he will be safer going the longer distance and up the ramp (especially given his current vertigo).           Balance Overall balance assessment: Needs assistance Sitting-balance support: Feet supported;Bilateral upper extremity supported Sitting balance-Leahy Scale: Good     Standing balance support: Bilateral upper extremity supported Standing balance-Leahy Scale: Poor Standing balance comment: needs external assist in standing from AD or hands.  Cognition Arousal/Alertness: Awake/alert Behavior During Therapy: WFL for tasks assessed/performed Overall Cognitive Status: Within Functional Limits for tasks assessed                                               Pertinent Vitals/Pain Pain Assessment: Faces Faces Pain Scale: Hurts even more Pain  Location: back  Pain Descriptors / Indicators: Operative site guarding;Grimacing;Guarding Pain Intervention(s): Limited activity within patient's tolerance;Monitored during session;Repositioned           PT Goals (current goals can now be found in the care plan section) Acute Rehab PT Goals Patient Stated Goal: to have less pain, decrease dizziness Progress towards PT goals: Progressing toward goals    Frequency    Min 5X/week      PT Plan Current plan remains appropriate       AM-PAC PT "6 Clicks" Mobility   Outcome Measure  Help needed turning from your back to your side while in a flat bed without using bedrails?: A Little Help needed moving from lying on your back to sitting on the side of a flat bed without using bedrails?: A Little Help needed moving to and from a bed to a chair (including a wheelchair)?: A Little Help needed standing up from a chair using your arms (e.g., wheelchair or bedside chair)?: A Little Help needed to walk in hospital room?: A Little Help needed climbing 3-5 steps with a railing? : A Lot 6 Click Score: 17    End of Session Equipment Utilized During Treatment: Back brace Activity Tolerance: Other (comment);Patient limited by pain(limited by pain and dizziness) Patient left: in chair;with call bell/phone within reach;with family/visitor present(wife present)   PT Visit Diagnosis: Other abnormalities of gait and mobility (R26.89);Pain Pain - Right/Left: (central ) Pain - part of body: (back)     Time: [id number] PT Time Calculation (min) (ACUTE ONLY): 19 min  Charges:  $Gait Training: 8-22 mins                    Verdene Lennert, PT, DPT  Acute Rehabilitation [phone] pager #[phone] office     09/19/2019, 12:43 PM

## 2019-09-19 NOTE — Progress Notes (Addendum)
Progress Note    09/19/2019 7:35 AM 5 Days Post-Op  Subjective: Wife is at bedside.  Patient is awake and alert.  He is tolerating his diet and is being advanced.  Less nausea now that he is eating more.  No abdominal pain.  Denies fever or chills   Vitals:   09/18/19 2330 09/19/19 0353  BP: (!) 171/95 (!) 148/71  Pulse: 76 77  Resp: 20 18  Temp: 97.7 F (36.5 C) 98.2 F (36.8 C)  SpO2: 97% 97%    Physical Exam: Cardiac: Rate and rhythm are regular Lungs: Nonlabored Incisions: Incision is well approximated.  There is no drainage Extremities: Moves all extremities well Abdomen: Soft with active bowel sounds.  There is mild erythema extending across the lower abdomen.  No increased warmth.  There is mild ecchymosis of the mons pubis.  CBC    Component Value Date/Time   WBC 11.1 (H) 09/15/2019 0531   RBC 4.20 (L) 09/15/2019 0531   HGB 12.5 (L) 09/15/2019 0531   HCT 39.9 09/15/2019 0531   PLT 254 09/15/2019 0531   MCV 95.0 09/15/2019 0531   MCH 29.8 09/15/2019 0531   MCHC 31.3 09/15/2019 0531   RDW 13.5 09/15/2019 0531   RDW 15.0 03/01/2014 1452   LYMPHSABS 2.1 03/01/2014 1452   MONOABS 1.1 (H) 12/05/2010 1524   EOSABS 0.1 03/01/2014 1452   BASOSABS 0.0 03/01/2014 1452    BMET    Component Value Date/Time   NA 137 09/15/2019 0531   NA 139 03/01/2014 1452   K 3.5 09/15/2019 0531   CL 101 09/15/2019 0531   CO2 27 09/15/2019 0531   GLUCOSE 137 (H) 09/15/2019 0531   BUN 14 09/15/2019 0531   BUN 9 03/01/2014 1452   CREATININE 1.09 09/15/2019 0531   CALCIUM 7.6 (L) 09/15/2019 0531   GFRNONAA >60 09/15/2019 0531   GFRAA >60 09/15/2019 0531     Intake/Output Summary (Last 24 hours) at 09/19/2019 0735 Last data filed at 09/18/2019 1900 Gross per 24 hour  Intake 3223.17 ml  Output 700 ml  Net 2523.17 ml    HOSPITAL MEDICATIONS Scheduled Meds: . baclofen  20 mg Oral TID  . dexamethasone (DECADRON) injection  4 mg Intravenous Q6H  . docusate sodium  100 mg  Oral BID  . lisinopril  10 mg Oral Daily   And  . hydrochlorothiazide  12.5 mg Oral Daily  . loratadine  10 mg Oral Daily  . melatonin  9 mg Oral QHS  . methylnaltrexone  12 mg Subcutaneous Once  . morphine  30 mg Oral Q12H  . multivitamin  1 tablet Oral Daily  . pantoprazole (PROTONIX) IV  40 mg Intravenous QHS  . polyethylene glycol  17 g Oral BID  . pravastatin  80 mg Oral QHS  . pregabalin  150 mg Oral BID  . pregabalin  75 mg Oral q morning - 10a  . sodium chloride flush  3 mL Intravenous Q12H  . Vitamin D (Ergocalciferol)  50,000 Units Oral Once per day on Mon Thu   Continuous Infusions: . sodium chloride    . dextrose 5 % and 0.45 % NaCl with KCl 20 mEq/L Stopped (09/18/19 1011)  . methocarbamol (ROBAXIN) IV     PRN Meds:.acetaminophen, alum & mag hydroxide-simeth, bisacodyl, HYDROcodone-acetaminophen, HYDROmorphone (DILAUDID) injection, meclizine, menthol-cetylpyridinium **OR** phenol, methocarbamol **OR** methocarbamol (ROBAXIN) IV, nitroGLYCERIN, nystatin cream, ondansetron **OR** ondansetron (ZOFRAN) IV, oxyCODONE, polyethylene glycol, promethazine, simethicone, sodium chloride flush, sodium phosphate, traMADol, zolpidem  Assessment:  69 y.o. male is s/p:  ALIF.  Bowel function has resumed.  Erythema noted on the lower abdomen. 5 Days Post-Op  Plan: -Erythema of lower abdomen does not appear to be florid cellulitis, however will defer coverage with antibiotics to primary team.  He is afebrile, check CBC continue to monitor. -DVT prophylaxis: SCDs   Risa Grill, PA-C [facility] [phone] 09/19/2019  7:35 AM   I have seen and evaluated the patient. I agree with the PA note as documented above. POD#5 s/p L5-S1 ALIF.  Abdominal distension much improved and having bowel movements.  Afebrile.  Looks much better.  Palpable pulse in left foot.  Would start ancef for cellulitis of abdominal wall around incision - blanching erythema and warm to touch.     [name], MD [facility] [place] Office: [phone]

## 2019-09-20 ENCOUNTER — Encounter (HOSPITAL_COMMUNITY): Payer: Self-pay | Admitting: Neurosurgery

## 2019-09-20 MED ORDER — PANTOPRAZOLE SODIUM 40 MG PO TBEC
40.0000 mg | DELAYED_RELEASE_TABLET | Freq: Every day | ORAL | Status: DC
  Administered 2019-09-20 – 2019-09-22 (×3): 40 mg via ORAL
  Filled 2019-09-20 (×3): qty 1

## 2019-09-20 MED FILL — Heparin Sodium (Porcine) Inj 1000 Unit/ML: INTRAMUSCULAR | Qty: 30 | Status: AC

## 2019-09-20 MED FILL — Sodium Chloride IV Soln 0.9%: INTRAVENOUS | Qty: 1000 | Status: AC

## 2019-09-20 NOTE — Plan of Care (Signed)
  Problem: Clinical Measurements: Goal: Ability to maintain clinical measurements within normal limits will improve Outcome: Progressing   Problem: Coping: Goal: Level of anxiety will decrease Outcome: Progressing   Problem: Pain Managment: Goal: General experience of comfort will improve Outcome: Progressing   

## 2019-09-20 NOTE — Progress Notes (Addendum)
Progress Note    09/20/2019 8:53 AM 6 Days Post-Op  Subjective: Sitting on side of bed, comfortable.  Daughter and wife at bedside.  He just finished all his breakfast without nausea or vomiting   Vitals:   09/20/19 0323 09/20/19 0747  BP:  (!) 151/70  Pulse:  84  Resp:  15  Temp: 98 F (36.7 C) 97.9 F (36.6 C)  SpO2:  92%    Physical Exam: Cardiac: Rate and rhythm are regular Respiratory: Nonlabored Incisions: Significant ecchymosis of spine incision.  Abdominal incision well approximated without signs of infection.  Very mild erythema of the lower abdominal wall Extremities: Moves all extremities well Abdomen: Nondistended, soft, active bowel sounds  CBC    Component Value Date/Time   WBC 7.2 09/19/2019 0842   RBC 4.50 09/19/2019 0842   HGB 13.4 09/19/2019 0842   HCT 42.4 09/19/2019 0842   PLT 308 09/19/2019 0842   MCV 94.2 09/19/2019 0842   MCH 29.8 09/19/2019 0842   MCHC 31.6 09/19/2019 0842   RDW 12.6 09/19/2019 0842   RDW 15.0 03/01/2014 1452   LYMPHSABS 0.4 (L) 09/19/2019 0842   LYMPHSABS 2.1 03/01/2014 1452   MONOABS 0.4 09/19/2019 0842   EOSABS 0.0 09/19/2019 0842   EOSABS 0.1 03/01/2014 1452   BASOSABS 0.0 09/19/2019 0842   BASOSABS 0.0 03/01/2014 1452    BMET    Component Value Date/Time   NA 137 09/15/2019 0531   NA 139 03/01/2014 1452   K 3.5 09/15/2019 0531   CL 101 09/15/2019 0531   CO2 27 09/15/2019 0531   GLUCOSE 137 (H) 09/15/2019 0531   BUN 14 09/15/2019 0531   BUN 9 03/01/2014 1452   CREATININE 1.09 09/15/2019 0531   CALCIUM 7.6 (L) 09/15/2019 0531   GFRNONAA >60 09/15/2019 0531   GFRAA >60 09/15/2019 0531     Intake/Output Summary (Last 24 hours) at 09/20/2019 0853 Last data filed at 09/20/2019 0320 Gross per 24 hour  Intake 793 ml  Output --  Net 793 ml    HOSPITAL MEDICATIONS Scheduled Meds: . baclofen  20 mg Oral TID  . dexamethasone (DECADRON) injection  4 mg Intravenous Q6H  . docusate sodium  100 mg Oral BID   . lisinopril  10 mg Oral Daily   And  . hydrochlorothiazide  12.5 mg Oral Daily  . loratadine  10 mg Oral Daily  . melatonin  9 mg Oral QHS  . methylnaltrexone  12 mg Subcutaneous Once  . methylnaltrexone  12 mg Subcutaneous Once  . morphine  30 mg Oral Q12H  . multivitamin  1 tablet Oral Daily  . pantoprazole  40 mg Oral QHS  . polyethylene glycol  17 g Oral BID  . pravastatin  80 mg Oral QHS  . pregabalin  150 mg Oral BID  . pregabalin  75 mg Oral q morning - 10a  . sodium chloride flush  3 mL Intravenous Q12H  . Vitamin D (Ergocalciferol)  50,000 Units Oral Once per day on Mon Thu   Continuous Infusions: . sodium chloride    .  ceFAZolin (ANCEF) IV 2 g (09/20/19 [id number])  . dextrose 5 % and 0.45 % NaCl with KCl 20 mEq/L Stopped (09/18/19 1011)  . methocarbamol (ROBAXIN) IV 500 mg (09/19/19 1846)   PRN Meds:.acetaminophen, alum & mag hydroxide-simeth, bisacodyl, HYDROmorphone (DILAUDID) injection, meclizine, menthol-cetylpyridinium **OR** phenol, methocarbamol **OR** methocarbamol (ROBAXIN) IV, nitroGLYCERIN, nystatin cream, ondansetron **OR** ondansetron (ZOFRAN) IV, oxyCODONE, polyethylene glycol, promethazine, simethicone, sodium chloride flush, sodium  phosphate, traMADol, zolpidem  Assessment:  69 y.o. male is s/p:  ALIF.  Developed erythema of lower abdominal wall erythema 3 days ago.  Ancef initiated yesterday.  He has remained afebrile without leukocytosis or left shift.  Improvement in abdominal wall erythema today.  Postoperative ileus resolved 6 Days Post-Op  Plan: We will plan to continue antibiotics another 24 hours    Risa Grill, PA-C [facility] [phone] 09/20/2019  8:53 AM   I have seen and evaluated the patient. I agree with the PA note as documented above. Doing much better.  Having bowel movements.  Abdomen soft although obese.  Pain is improving.  Overall continues to look better.  Cellulitis of abdominal wall improving with Ancef.   Would continue IV antibiotics for another 24 hours.  [name], MD [facility] [place] [facility] Office: [phone]

## 2019-09-20 NOTE — Progress Notes (Signed)
Physical Therapy Treatment Patient Details Name: Darin Gomez MRN: [id number] DOB: 1951/04/10 Today's Date: 09/20/2019    History of Present Illness 69 y.o. male, with history of hyperlipidemia and chronic back pain and bil lower extremity radiculopathy that presents 09/14/19 for L5-S1 ALIF, L3-4, L4-5 XLIF, L3-S1 PSI/PLA.  PMH includes: vertigo, neuropathy, HTN, blance disorder, s/p TKA, s/p spinal cord stimulator, s/p lumbar laminectomy. Pt developed post-op ileus.     PT Comments    Pt wanting to try steps today. Ambulated 100' with min A and RW, took seated rest break, practiced 2 steps and 2 step ups with min A and returned to seated rest, ambulated 100' again. Pt with lower back and L hip discomfort last few feet of ambulation. Pt returned to SL position in bed, sit>SL caused vertigo symptoms. Pt progressing with mobility. PT will continue to follow.    Follow Up Recommendations  Home health PT;Supervision for mobility/OOB     Equipment Recommendations  Other (comment)(cane, specifics per pt)    Recommendations for Other Services       Precautions / Restrictions Precautions Precautions: Fall;Back Precaution Comments: pt able to verbalize 3/3 precautions Required Braces or Orthoses: Spinal Brace Spinal Brace: Lumbar corset;Applied in sitting position Restrictions Weight Bearing Restrictions: No    Mobility  Bed Mobility Overal bed mobility: Needs Assistance Bed Mobility: Sit to Sidelying         Sit to sidelying: Supervision General bed mobility comments: pt able to go sit>SL without physical assist but moved quickly and became very dizzy and needed to remain in SL  Transfers Overall transfer level: Needs assistance Equipment used: Rolling walker (2 wheeled) Transfers: Sit to/from Stand Sit to Stand: Min guard         General transfer comment: min-guard for safety and it takes a few seconds for pt to gain balance with initial  standing  Ambulation/Gait Ambulation/Gait assistance: Min assist;+2 safety/equipment(chair to follow, wife, [employer], pushing chair) Gait Distance (Feet): 200 Feet(2 seated rest breaks taken) Assistive device: Rolling walker (2 wheeled) Gait Pattern/deviations: Step-through pattern;Trunk flexed Gait velocity: decreased Gait velocity interpretation: <1.8 ft/sec, indicate of risk for recurrent falls General Gait Details: pt with discomfort low back and L hip by end of ambulation.    Stairs Stairs: Yes Stairs assistance: Min assist Stair Management: One rail Right;Step to pattern;Forwards Number of Stairs: 2 General stair comments: practiced 2 steps with turn around and descend. Also practiced step up and down bkwds 2x for strengthening but pt heavily reliant on UE's for this and cued that it will benefit him more after more healing   Wheelchair Mobility    Modified Rankin (Stroke Patients Only)       Balance Overall balance assessment: Needs assistance Sitting-balance support: Feet supported;Bilateral upper extremity supported Sitting balance-Leahy Scale: Good Sitting balance - Comments: using bil UEs to unweight back on RW or bed   Standing balance support: Bilateral upper extremity supported Standing balance-Leahy Scale: Poor Standing balance comment: unsteady without support               High Level Balance Comments: worked on standing within [place] and letting go with both hands with min A for safety and engaging abdominal muscles to maintain balance, could hold 5-10 sec            Cognition Arousal/Alertness: Awake/alert Behavior During Therapy: WFL for tasks assessed/performed Overall Cognitive Status: Within Functional Limits for tasks assessed  Exercises Other Exercises Other Exercises: resisted df, 10x bilateral    General Comments General comments (skin integrity, edema, etc.): discussed activity level  for home and cues that he needs to take a rest      Pertinent Vitals/Pain Pain Assessment: 0-10 Pain Score: 4  Pain Location: back  Pain Descriptors / Indicators: Operative site guarding;Grimacing;Guarding Pain Intervention(s): Limited activity within patient's tolerance;Monitored during session    Home Living Family/patient expects to be discharged to:: Private residence Living Arrangements: Spouse/significant other                  Prior Function            PT Goals (current goals can now be found in the care plan section) Acute Rehab PT Goals Patient Stated Goal: to have less pain, decrease dizziness PT Goal Formulation: With patient/family Time For Goal Achievement: 09/30/19 Potential to Achieve Goals: Good Progress towards PT goals: Progressing toward goals    Frequency    Min 5X/week      PT Plan Current plan remains appropriate    Co-evaluation              AM-PAC PT "6 Clicks" Mobility   Outcome Measure  Help needed turning from your back to your side while in a flat bed without using bedrails?: A Little Help needed moving from lying on your back to sitting on the side of a flat bed without using bedrails?: A Little Help needed moving to and from a bed to a chair (including a wheelchair)?: A Little Help needed standing up from a chair using your arms (e.g., wheelchair or bedside chair)?: A Little Help needed to walk in hospital room?: A Little Help needed climbing 3-5 steps with a railing? : A Lot 6 Click Score: 17    End of Session Equipment Utilized During Treatment: Back brace;Gait belt Activity Tolerance: Patient tolerated treatment well Patient left: with call bell/phone within reach;with family/visitor present;in bed;with bed alarm set(wife present) Nurse Communication: Mobility status PT Visit Diagnosis: Other abnormalities of gait and mobility (R26.89);Pain Pain - Right/Left: Left(central ) Pain - part of body: Hip(back)      Time: [id number] PT Time Calculation (min) (ACUTE ONLY): 36 min  Charges:  $Gait Training: 23-37 mins                     [place]  Pager [phone] Office [place] 09/20/2019, 2:21 PM

## 2019-09-20 NOTE — Progress Notes (Signed)
  NEUROSURGERY PROGRESS NOTE   No issues overnight.  Back pain continues to improved. Did require 1 dose of dilaudid last night. Per wife, has already been seen by vascular this am. Plan for one more day of abx.  EXAM:  BP (!) 151/70 (BP Location: Right Arm)   Pulse 84   Temp 97.9 F (36.6 C) (Axillary)   Resp 15   Ht 6' (1.829 m)   Wt (!) 136.6 kg   SpO2 92%   BMI 40.84 kg/m   Awake, alert, oriented  Speech fluent, appropriate  CN grossly intact  MAEW, stable motor  Xlif/posterior incision: c/d/i abd incision: c/d/i, improved redness  IMPRESSION/PLAN 69 y.o. male POD#6L5-S1 ALIF, L3-4, L4-5 XLIF, L3-S1 PSI/PLA.Slow steady improvement in pain and mobility.  - abdominal wal cellulitis: abx her vascular - continue supportive care, PT/OT - continue decadron, avoid IV pain meds - Hopeful for discharge this weekend per patient and wife

## 2019-09-21 NOTE — Progress Notes (Signed)
Physical Therapy Treatment Patient Details Name: Darin Gomez MRN: [id number] DOB: November 19, 1950 Today's Date: 09/21/2019    History of Present Illness 69 y.o. male, with history of hyperlipidemia and chronic back pain and bil lower extremity radiculopathy that presents 09/14/19 for L5-S1 ALIF, L3-4, L4-5 XLIF, L3-S1 PSI/PLA.  PMH includes: vertigo, neuropathy, HTN, blance disorder, s/p TKA, s/p spinal cord stimulator, s/p lumbar laminectomy. Pt developed post-op ileus.     PT Comments    Pt is brighter, and in less pain making it easier to progress his mobility further down the hallway and to practice more steps today simulating his home entry (he does have the option to use his ramp if he prefers, but the walk to the ramp from the car is further than the walk from the car to the 3 stairs).  He is hopeful to d/c home over the weekend and his family (today his daughter) is very helpful during our sessions.  PT will continue to follow acutely for safe mobility progression.   Follow Up Recommendations  Home health PT;Supervision for mobility/OOB     Equipment Recommendations  Other (comment)(cane, pt wants a specific type)    Recommendations for Other Services   NA     Precautions / Restrictions Precautions Precautions: Fall;Back Required Braces or Orthoses: Spinal Brace Spinal Brace: Lumbar corset;Applied in sitting position    Mobility  Bed Mobility Overal bed mobility: Needs Assistance Bed Mobility: Sit to Sidelying         Sit to sidelying: Min guard General bed mobility comments: Min guard assist for safety to ensure he got his legs up and in the bed on his frist try. Heavy reliance on bed rail.   Transfers Overall transfer level: Needs assistance Equipment used: Rolling walker (2 wheeled) Transfers: Sit to/from Stand Sit to Stand: Min guard         General transfer comment: Min guard assist to stabilize RW during transitions.   Ambulation/Gait Ambulation/Gait  assistance: Min guard;+2 safety/equipment Gait Distance (Feet): 100 Feet(x2 with seated rest after stair training. ) Assistive device: Rolling walker (2 wheeled) Gait Pattern/deviations: Step-through pattern;Shuffle;Trunk flexed Gait velocity: decreased Gait velocity interpretation: 1.31 - 2.62 ft/sec, indicative of limited community ambulator General Gait Details: Pt self cueing for upright posture and lighter hands on RW handles.  He does tend to buckle a bit more when he lightens his hands on RW at the end of gait.    Stairs Stairs: Yes Stairs assistance: Min assist Stair Management: One rail Right;Step to pattern;Forwards Number of Stairs: 3 General stair comments: practiced three steps forwards with R rail and hand held assist, then turned sideways to come down so that he could have both hands on railing for support during descent.           Balance Overall balance assessment: Needs assistance Sitting-balance support: Feet supported;Bilateral upper extremity supported;No upper extremity supported Sitting balance-Leahy Scale: Good     Standing balance support: Bilateral upper extremity supported Standing balance-Leahy Scale: Poor Standing balance comment: needs external support in standing.                             Cognition Arousal/Alertness: Awake/alert Behavior During Therapy: WFL for tasks assessed/performed Overall Cognitive Status: Within Functional Limits for tasks assessed  Pertinent Vitals/Pain Pain Assessment: Faces Faces Pain Scale: Hurts little more Pain Location: back  Pain Descriptors / Indicators: Operative site guarding;Grimacing;Guarding Pain Intervention(s): Limited activity within patient's tolerance;Monitored during session;Repositioned           PT Goals (current goals can now be found in the care plan section) Acute Rehab PT Goals Patient Stated Goal: to go home  this weekend.  Progress towards PT goals: Progressing toward goals    Frequency    Min 5X/week      PT Plan Current plan remains appropriate       AM-PAC PT "6 Clicks" Mobility   Outcome Measure  Help needed turning from your back to your side while in a flat bed without using bedrails?: A Little Help needed moving from lying on your back to sitting on the side of a flat bed without using bedrails?: A Little Help needed moving to and from a bed to a chair (including a wheelchair)?: A Little Help needed standing up from a chair using your arms (e.g., wheelchair or bedside chair)?: A Little Help needed to walk in hospital room?: A Little Help needed climbing 3-5 steps with a railing? : A Little 6 Click Score: 18    End of Session Equipment Utilized During Treatment: Back brace Activity Tolerance: Patient limited by fatigue Patient left: in bed;with call bell/phone within reach;with family/visitor present   PT Visit Diagnosis: Other abnormalities of gait and mobility (R26.89);Pain Pain - Right/Left: Left Pain - part of body: Hip     Time: [id number] PT Time Calculation (min) (ACUTE ONLY): 32 min  Charges:  $Gait Training: 23-37 mins                    Verdene Lennert, PT, DPT  Acute Rehabilitation [phone] pager [phone]) [phone] office

## 2019-09-21 NOTE — Progress Notes (Signed)
Occupational Therapy Treatment Patient Details Name: Darin Gomez MRN: [id number] DOB: 04-Dec-1950 Today's Date: 09/21/2019    History of present illness 69 y.o. male, with history of hyperlipidemia and chronic back pain and bil lower extremity radiculopathy that presents 09/14/19 for L5-S1 ALIF, L3-4, L4-5 XLIF, L3-S1 PSI/PLA.  PMH includes: vertigo, neuropathy, HTN, blance disorder, s/p TKA, s/p spinal cord stimulator, s/p lumbar laminectomy. Pt developed post-op ileus.    OT comments  Pt progressing towards established OT goals and very motivated to participate in therapy. Pt verbalizing understanding with use of AE for LB ADLs and toileting. Pt performing oral care at sink with Min guard A. Pt performing functional mobility in hallway with Min Guard A and RW. Pt performing sit<>stand at shower seat in bathroom with Min guard A for set up to shower. Wife present for pt to shower and NT/RN notified. Continue to recommend dc to home with HHOT and will continue to follow acutely as admitted.    Follow Up Recommendations  Home health OT;Supervision/Assistance - 24 hour    Equipment Recommendations  Tub/shower bench    Recommendations for Other Services      Precautions / Restrictions Precautions Precautions: Fall;Back Precaution Comments: Reviewed back precautions Required Braces or Orthoses: Spinal Brace Spinal Brace: Lumbar corset;Applied in sitting position       Mobility Bed Mobility Overal bed mobility: Needs Assistance Bed Mobility: Sit to Sidelying         Sit to sidelying: Min guard General bed mobility comments: In recliner upon arrival  Transfers Overall transfer level: Needs assistance Equipment used: Rolling walker (2 wheeled) Transfers: Sit to/from Stand Sit to Stand: Min guard         General transfer comment: Min guard assist to stabilize RW during transitions.     Balance Overall balance assessment: Needs assistance Sitting-balance support: Feet  supported;Bilateral upper extremity supported;No upper extremity supported Sitting balance-Leahy Scale: Good     Standing balance support: Bilateral upper extremity supported;During functional activity Standing balance-Leahy Scale: Fair Standing balance comment: Able to perform grooming at sink                           ADL either performed or assessed with clinical judgement   ADL Overall ADL's : Needs assistance/impaired     Grooming: Oral care;Min guard;Standing Grooming Details (indicate cue type and reason): Cues for compensatory techniques. Min Guard A for Administrator Dressing : Supervision/safety;Set up;Sitting Upper Body Dressing Details (indicate cue type and reason): Pt donning/doffing brace   Lower Body Dressing Details (indicate cue type and reason): Pt verbalizing use of AE for LB dressing. Stating, "I like that sock thing!" Toilet Transfer: Min guard;Ambulation;RW;BSC     Toileting - Clothing Manipulation Details (indicate cue type and reason): Reviewed bending at knees for peri care as well as use of toilet aide     Functional mobility during ADLs: Min guard;Rolling walker General ADL Comments: Pt performing grooming, fucntional mobility, and then set up for bathing with wife. Pt demosntrating understanding of education     Vision       Perception     Praxis      Cognition Arousal/Alertness: Awake/alert Behavior During Therapy: WFL for tasks assessed/performed Overall Cognitive Status: Within Functional Limits for tasks assessed  Exercises     Shoulder Instructions       General Comments Wife arriving at end of session    Pertinent Vitals/ Pain       Pain Assessment: Faces Faces Pain Scale: Hurts little more Pain Location: back  Pain Descriptors / Indicators: Operative site guarding;Grimacing;Guarding Pain Intervention(s): Monitored during session;Limited  activity within patient's tolerance;Repositioned  Home Living                                          Prior Functioning/Environment              Frequency  Min 2X/week        Progress Toward Goals  OT Goals(current goals can now be found in the care plan section)  Progress towards OT goals: Progressing toward goals  Acute Rehab OT Goals Patient Stated Goal: to go home this weekend.  OT Goal Formulation: With patient/family Time For Goal Achievement: 09/30/19 Potential to Achieve Goals: Good ADL Goals Pt Will Perform Grooming: with supervision;standing Pt Will Perform Upper Body Bathing: with set-up;sitting Pt Will Perform Lower Body Bathing: with min assist;with adaptive equipment;sit to/from stand Pt Will Perform Upper Body Dressing: with set-up;sitting Pt Will Perform Lower Body Dressing: with min assist;with adaptive equipment;sit to/from stand Pt Will Transfer to Toilet: with min guard assist;ambulating;regular height toilet;bedside commode;grab bars Pt Will Perform Toileting - Clothing Manipulation and hygiene: with min guard assist;sit to/from stand Pt Will Perform Tub/Shower Transfer: Shower transfer;with min guard assist;ambulating;tub bench;rolling walker;grab bars  Plan Discharge plan remains appropriate    Co-evaluation                 AM-PAC OT "6 Clicks" Daily Activity     Outcome Measure   Help from another person eating meals?: A Little Help from another person taking care of personal grooming?: A Little Help from another person toileting, which includes using toliet, bedpan, or urinal?: A Little Help from another person bathing (including washing, rinsing, drying)?: A Lot Help from another person to put on and taking off regular upper body clothing?: A Little Help from another person to put on and taking off regular lower body clothing?: A Lot 6 Click Score: 16    End of Session Equipment Utilized During Treatment:  Rolling walker;Back brace  OT Visit Diagnosis: Pain;Unsteadiness on feet (R26.81) Pain - part of body: (Back)   Activity Tolerance Patient tolerated treatment well   Patient Left in bed;with call bell/phone within reach;with family/visitor present   Nurse Communication Mobility status        Time: 1331-1404 OT Time Calculation (min): 33 min  Charges: OT General Charges $OT Visit: 1 Visit OT Treatments $Self Care/Home Management : 23-37 mins  [place], OTR/L Acute Rehab Pager: [phone] Office: [place] 09/21/2019, 5:39 PM

## 2019-09-21 NOTE — Progress Notes (Signed)
  NEUROSURGERY PROGRESS NOTE   No issues overnight.  Continues to improve No real concerns this am  EXAM:  BP (!) 146/78 (BP Location: Left Arm)   Pulse 60   Temp 97.7 F (36.5 C) (Oral)   Resp 20   Ht 6' (1.829 m)   Wt (!) 136.6 kg   SpO2 96%   BMI 40.84 kg/m   Awake, alert, oriented  Speech fluent, appropriate  CN grossly intact  MAEW, stable motor Incisions: c/d/i, receeding redness  IMPRESSION/PLAN 69 y.o. male POD#7L5-S1 ALIF, L3-4, L4-5 XLIF, L3-S1 PSI/PLA.continues to improve. - abdominal wal cellulitis: abx her vascular - continue supportive care, PT/OT -continuedecadron, avoid IV pain meds. Will send home on medrol dose pack - Hopeful for discharge tomorrow or Sunday

## 2019-09-21 NOTE — Plan of Care (Signed)
Continue to monitor

## 2019-09-21 NOTE — Progress Notes (Signed)
[facility] [place]  Subjective  - no complaints.  Tolerating PO.  Some pain with walking yesterday.   Objective (!) 154/72 (!) 43 97.7 F (36.5 C) (Oral) 15 92%  Intake/Output Summary (Last 24 hours) at 09/21/2019 0930 Last data filed at 09/21/2019 0843 Gross per 24 hour  Intake 720 ml  Output --  Net 720 ml    Erythema on abdominal wall improving No significant abdominal pain Palpable left DP pulse  Laboratory Lab Results: Recent Labs    09/19/19 0842  WBC 7.2  HGB 13.4  HCT 42.4  PLT 308   BMET No results for input(s): NA, K, CL, CO2, GLUCOSE, BUN, CREATININE, CALCIUM in the last 72 hours.  COAG Lab Results  Component Value Date   INR 1.1 03/01/2014   INR 1.06 12/05/2010   No results found for: PTT  Assessment/Planning:  S/P L5-S1 ALIF.  Continues to do much better.  Having bowel movements.  Abdomen soft although obese.  Pain is improving. Cellulitis versus reaction to abdominal wall improving with Ancef.  Would continue IV antibiotics until plan for discharge.  Dispo per neurosurgery.   [name] 09/21/2019 9:30 AM --

## 2019-09-22 MED ORDER — DEXAMETHASONE SODIUM PHOSPHATE 4 MG/ML IJ SOLN
2.0000 mg | Freq: Four times a day (QID) | INTRAMUSCULAR | Status: AC
  Administered 2019-09-22 – 2019-09-23 (×4): 2 mg via INTRAVENOUS
  Filled 2019-09-22 (×4): qty 1

## 2019-09-22 NOTE — Progress Notes (Signed)
   Providing Compassionate, Quality Care - Together   Subjective: Patient reports intermittent muscle spasms. Denies incisional pain. Feels like redness on abdomen has improved tremendously.  Objective: Vital signs in last 24 hours: Temp:  [97.5 F (36.4 C)-98.2 F (36.8 C)] 98 F (36.7 C) (04/24 0722) Pulse Rate:  [50-59] 55 (04/24 0722) Resp:  [17-20] 20 (04/24 0722) BP: (139-162)/(70-82) 162/78 (04/24 0722) SpO2:  [89 %-97 %] 97 % (04/24 0722)  Intake/Output from previous day: 04/23 0701 - 04/24 0700 In: 300 [P.O.:300] Out: 450 [Urine:450] Intake/Output this shift: Total I/O In: 240 [P.O.:240] Out: -   Alert and oriented x 4 PERRLA CN II-XII grossly intact MAE, Strength and sensation intact Incisions with Dermabond.Sites are clean, dry, and intact; Some bruising Abdomen without erythema   Lab Results: No results for input(s): WBC, HGB, HCT, PLT in the last 72 hours. BMET No results for input(s): NA, K, CL, CO2, GLUCOSE, BUN, CREATININE, CALCIUM in the last 72 hours.  Studies/Results: No results found.  Assessment/Plan: [name] is eight days status post L5-S1 ALIF by Dr. Vertell Limber. His postoperative course has been complicated by pain control and muscle spasms. I am switching his pain medications and Robaxin to PO. Plan is for discharge home tomorrow.   LOS: 8 days    Viona Gilmore, DNP, AGNP-C Nurse Practitioner  [facility] [place] [address], [place], [place], [place] [address] P: [phone]    F: [phone]  09/22/2019, 10:47 AM

## 2019-09-22 NOTE — TOC Transition Note (Addendum)
Transition of Care [facility]) - CM/SW Discharge Note   Patient Details  Name: Darin Gomez MRN: [id number] Date of Birth: March 10, 1951  [facility] of [facility]) CM/SW Contact:  Claudie Leach, RN 09/22/2019, 12:18 PM   Clinical Narrative:    Patient to dc home tomorrow.  HHPT/OT recommended and patient agrees. Patient has no preference of agency.  Patient agreeable to [facility].  Referral accepted by Sharmon Revere. Patient's physical address is [place], [place] [address].  Patient requests DME bariatric 3n1 and quad-cane.  DME ordered from [employer] to be delivered to room today.  Patient will use 3n1 in the stand up shower and says he has no need of a tub bench.   Final next level of care: [place] Barriers to Discharge: No Barriers Identified   Patient Goals and CMS Choice Patient states their goals for this hospitalization and ongoing recovery are:: to get home CMS Medicare.gov Compare Post Acute Care list provided to:: Patient Choice offered to / list presented to : Patient   Discharge Plan and Services                DME Arranged: 3-N-1, Kasandra Knudsen DME Agency: [facility] Date DME Agency Contacted: 09/22/19 Time DME Agency Contacted: 1216 Representative spoke with at DME Agency: [place]: PT, OT [facility] Agency: [place] Date [place]: 09/22/19 Time [place]: 1216 Representative spoke with at [place]: Sharmon Revere

## 2019-09-22 NOTE — Progress Notes (Signed)
Physical Therapy Treatment Patient Details Name: Darin Gomez MRN: [id number] DOB: 18-Nov-1950 Today's Date: 09/22/2019    History of Present Illness 69 y.o. male, with history of hyperlipidemia and chronic back pain and bil lower extremity radiculopathy that presents 09/14/19 for L5-S1 ALIF, L3-4, L4-5 XLIF, L3-S1 PSI/PLA.  PMH includes: vertigo, neuropathy, HTN, blance disorder, s/p TKA, s/p spinal cord stimulator, s/p lumbar laminectomy. Pt developed post-op ileus.     PT Comments    PT tolerated treatment well despite reports of fatigue during session. Pt continues to require minG-close supervision for transfers and stair negotiation due to lack of LE power. Pt reports fatigue with activity at this time, PT educates on endurance deficits in postural muscles after spinal surgery which can cause significant fatigue with activity. Pt dons and doffs brace independently and verbalizes spinal precautions. Pt will benefit from continued PT POC to continue improving LE power and activity tolerance.  Follow Up Recommendations  Home health PT;Supervision for mobility/OOB     Equipment Recommendations  Other (comment)(cane, pt wants a specific type)    Recommendations for Other Services       Precautions / Restrictions Precautions Precautions: Fall;Back Precaution Comments: reviewed back precautions during session Required Braces or Orthoses: Spinal Brace Spinal Brace: Lumbar corset;Applied in sitting position Restrictions Weight Bearing Restrictions: No    Mobility  Bed Mobility Overal bed mobility: Needs Assistance Bed Mobility: Sidelying to Sit;Rolling Rolling: Min assist Sidelying to sit: Min guard       General bed mobility comments: pt visitor assisting with bed mobility  Transfers Overall transfer level: Needs assistance Equipment used: Rolling walker (2 wheeled) Transfers: Sit to/from Stand Sit to Stand: Min guard            Ambulation/Gait Ambulation/Gait  assistance: Supervision Gait Distance (Feet): 200 Feet Assistive device: Rolling walker (2 wheeled) Gait Pattern/deviations: Step-to pattern Gait velocity: decreased Gait velocity interpretation: 1.31 - 2.62 ft/sec, indicative of limited community ambulator General Gait Details: pt with short step to gait, erect trunk posture   Stairs Stairs: Yes Stairs assistance: Min guard Stair Management: One rail Right;Forwards(descends sideways) Number of Stairs: 3     Wheelchair Mobility    Modified Rankin (Stroke Patients Only)       Balance Overall balance assessment: Needs assistance Sitting-balance support: No upper extremity supported;Feet supported Sitting balance-Leahy Scale: Good Sitting balance - Comments: supervision   Standing balance support: Single extremity supported Standing balance-Leahy Scale: Fair Standing balance comment: minG donning mask, adjusting brace                            Cognition Arousal/Alertness: Awake/alert Behavior During Therapy: WFL for tasks assessed/performed Overall Cognitive Status: Within Functional Limits for tasks assessed                                        Exercises      General Comments General comments (skin integrity, edema, etc.): VSS on RA      Pertinent Vitals/Pain Pain Assessment: Faces Faces Pain Scale: Hurts little more Pain Location: back Pain Descriptors / Indicators: Grimacing Pain Intervention(s): Monitored during session    Home Living                      Prior Function            PT Goals (  current goals can now be found in the care plan section) Acute Rehab PT Goals Patient Stated Goal: to go home this weekend.  Progress towards PT goals: Progressing toward goals    Frequency    Min 5X/week      PT Plan Current plan remains appropriate    Co-evaluation              AM-PAC PT "6 Clicks" Mobility   Outcome Measure  Help needed turning from  your back to your side while in a flat bed without using bedrails?: A Little Help needed moving from lying on your back to sitting on the side of a flat bed without using bedrails?: A Little Help needed moving to and from a bed to a chair (including a wheelchair)?: A Little Help needed standing up from a chair using your arms (e.g., wheelchair or bedside chair)?: A Little Help needed to walk in hospital room?: A Little Help needed climbing 3-5 steps with a railing? : A Little 6 Click Score: 18    End of Session Equipment Utilized During Treatment: Back brace Activity Tolerance: Patient tolerated treatment well Patient left: in bed;with call bell/phone within reach;with family/visitor present Nurse Communication: Mobility status PT Visit Diagnosis: Other abnormalities of gait and mobility (R26.89);Pain Pain - part of body: (back)     Time: [id number] PT Time Calculation (min) (ACUTE ONLY): 24 min  Charges:  $Gait Training: 8-22 mins $Therapeutic Activity: 8-22 mins                     Zenaida Niece, PT, DPT Acute Rehabilitation Pager: [phone]    Zenaida Niece 09/22/2019, 4:44 PM

## 2019-09-22 NOTE — Progress Notes (Addendum)
  Progress Note    09/22/2019 10:17 AM 8 Days Post-Op  Subjective:  Says he's getting a little bit better everyday.  Wife says redness on his belly is better.  Afebrile   Vitals:   09/22/19 0308 09/22/19 0722  BP: 139/70 (!) 162/78  Pulse: (!) 55 (!) 55  Resp:  20  Temp: (!) 97.5 F (36.4 C) 98 F (36.7 C)  SpO2: 97% 97%    Physical Exam: General:  No distress Lungs:  Non labored Incisions:  Clean and dry without drainage Abdomen:  Soft; slightly distended; +flatus  CBC    Component Value Date/Time   WBC 7.2 09/19/2019 0842   RBC 4.50 09/19/2019 0842   HGB 13.4 09/19/2019 0842   HCT 42.4 09/19/2019 0842   PLT 308 09/19/2019 0842   MCV 94.2 09/19/2019 0842   MCH 29.8 09/19/2019 0842   MCHC 31.6 09/19/2019 0842   RDW 12.6 09/19/2019 0842   RDW 15.0 03/01/2014 1452   LYMPHSABS 0.4 (L) 09/19/2019 0842   LYMPHSABS 2.1 03/01/2014 1452   MONOABS 0.4 09/19/2019 0842   EOSABS 0.0 09/19/2019 0842   EOSABS 0.1 03/01/2014 1452   BASOSABS 0.0 09/19/2019 0842   BASOSABS 0.0 03/01/2014 1452    BMET    Component Value Date/Time   NA 137 09/15/2019 0531   NA 139 03/01/2014 1452   K 3.5 09/15/2019 0531   CL 101 09/15/2019 0531   CO2 27 09/15/2019 0531   GLUCOSE 137 (H) 09/15/2019 0531   BUN 14 09/15/2019 0531   BUN 9 03/01/2014 1452   CREATININE 1.09 09/15/2019 0531   CALCIUM 7.6 (L) 09/15/2019 0531   GFRNONAA >60 09/15/2019 0531   GFRAA >60 09/15/2019 0531    INR    Component Value Date/Time   INR 1.1 03/01/2014 1452     Intake/Output Summary (Last 24 hours) at 09/22/2019 1017 Last data filed at 09/22/2019 0900 Gross per 24 hour  Intake 240 ml  Output 450 ml  Net -210 ml     Assessment:  69 y.o. male is s/p:  S/p ALIF  8 Days Post-Op  Plan: -incision healing nicely; erythema on abdomen improving -continue IV abx while in the hospital -will continue to follow   Leontine Locket, PA-C [facility] [phone] 09/22/2019 10:17  AM  I agree with the above.  I have seen and evaluated the patient.  The redness on his abdomen has significantly decreased.  His incision is without drainage.  We have recommended continuing antibiotics while in the hospital and likely transition to oral antibiotics for 1 week post discharge.  Annamarie Major

## 2019-09-23 MED ORDER — OXYCODONE HCL 10 MG PO TABS: 10.0000 mg | ORAL_TABLET | ORAL | 0 refills | Status: DC | PRN

## 2019-09-23 MED ORDER — METHOCARBAMOL 500 MG PO TABS: 500.0000 mg | ORAL_TABLET | Freq: Four times a day (QID) | ORAL | 1 refills | Status: DC | PRN

## 2019-09-23 MED ORDER — CEPHALEXIN 500 MG PO CAPS: 500.0000 mg | ORAL_CAPSULE | Freq: Four times a day (QID) | ORAL | 0 refills | Status: AC

## 2019-09-23 NOTE — Progress Notes (Signed)
Pt being discharged home with home health. IV removed and paperwork and medications gone over with pt and his wife with no questions. Pt escorted out in wheelchair with all personal belongings to personal vehicle.

## 2019-09-23 NOTE — Discharge Instructions (Signed)
Wound Care Keep incision covered and dry for two days.    Do not put any creams, lotions, or ointments on incision. Leave steri-strips on back.  They will fall off by themselves.  Activity Walk each and every day, increasing distance each day. No lifting greater than 5 lbs.  Avoid excessive neck motion. No driving for 2 weeks; may ride as a passenger locally.  Diet Resume your normal diet.   Return to Work Will be discussed at your follow up appointment.  Call Your Doctor If Any of These Occur Redness, drainage, or swelling at the wound.  Temperature greater than 101 degrees. Severe pain not relieved by pain medication. Incision starts to come apart.  Follow Up Appt Call today for appointment in 1-2 weeks ([phone]) or for problems.  If you have any hardware placed in your spine, you will need an x-ray before your appointment.  

## 2019-09-23 NOTE — Discharge Summary (Signed)
Physician Discharge Summary     Providing Compassionate, Quality Care - Together   [name] MRN: [id number] DOB/AGE: [birth year] 69 y.o.  Admit date: 09/14/2019 Discharge date: 09/23/2019  Admission Diagnoses: Spinal stenosis of lumbar region with radiculopathy  Discharge Diagnoses:  Active Problems:   Spinal stenosis of lumbar region with radiculopathy   Discharged Condition: good  Hospital Course: Patient was admitted to 4NP01 following L5-S1 ALIF by Dr. Vertell Limber on 09/14/2019. His postoperative course was complicated by significant back and hip pain, along with constipation, and questionable cellulitis at his abdomen. His constipation has since resolved. His pain is well-controlled with oral analgesics. He was started on IV antibiotics for the cellulitis and will be transitioned to Keflex for 10 days at discharge. PT and OT are recommending Home Health. The patient and his wife feel he is ready for discharge home today.  Consults: rehabilitation medicine and vascular surgery  Significant Diagnostic Studies: radiology: DG Lumbar Spine Complete  Result Date: 09/14/2019 CLINICAL DATA:  L3 through S1 fusion. EXAM: DG C-ARM 1-60 MIN; LUMBAR SPINE - COMPLETE 4+ VIEW COMPARISON:  Lumbar CT myelogram dated 05/07/2019. FINDINGS: Six C-arm views of the lumbar spine demonstrate interbody and posterior pedicle screw and rod fixation at the L3 through S1 with partial lumbarization of the S1 vertebra as previously noted. Normal alignment on these images. IMPRESSION: Operative changes, as described above. Electronically Signed   By: Claudie Revering M.D.   On: 09/14/2019 17:41   DG Abd 1 View - KUB  Result Date: 09/17/2019 CLINICAL DATA:  Abdominal distension. EXAM: ABDOMEN - 1 VIEW COMPARISON:  Lumbar spine x-rays dated September 14, 2019. FINDINGS: Paucity of bowel gas. No radio-opaque calculi or other significant radiographic abnormality are seen. Prior L3-S1 lumbar fusion. Spinal cord  stimulator noted. IMPRESSION: No acute findings. Electronically Signed   By: Titus Dubin M.D.   On: 09/17/2019 13:23   DG C-Arm 1-60 Min  Result Date: 09/14/2019 CLINICAL DATA:  L3 through S1 fusion. EXAM: DG C-ARM 1-60 MIN; LUMBAR SPINE - COMPLETE 4+ VIEW COMPARISON:  Lumbar CT myelogram dated 05/07/2019. FINDINGS: Six C-arm views of the lumbar spine demonstrate interbody and posterior pedicle screw and rod fixation at the L3 through S1 with partial lumbarization of the S1 vertebra as previously noted. Normal alignment on these images. IMPRESSION: Operative changes, as described above. Electronically Signed   By: Claudie Revering M.D.   On: 09/14/2019 17:41   DG OR LOCAL ABDOMEN  Result Date: 09/14/2019 CLINICAL DATA:  Protocol for abdominal exposure. No missing instruments EXAM: OR LOCAL ABDOMEN COMPARISON:  Lumbar myelogram 05/07/2019 FINDINGS: Anterior lumbar interbody fusion cage. There could be a vascular clip along the left hemipelvis. Spinal stimulator in place. No unexpected foreign body. Findings called to OR team at [phone]. IMPRESSION: No unexpected foreign body. Electronically Signed   By: Monte Fantasia M.D.   On: 09/14/2019 11:19     Treatments: surgery:  1) L5-S1 Anterior lumbar interbody fusion 2) Right L3-4, L4-5  anterolateral lumbar interbody fusion (Right) 3) L3-S1 posterior pedicle screw fixation  Discharge Exam: Blood pressure (!) 157/81, pulse 72, temperature 98.5 F (36.9 C), temperature source Oral, resp. rate 18, height 6' (1.829 m), weight (!) 136.6 kg, SpO2 98 %.  Alert and oriented x 4 PERRLA CN II-XII grossly intact MAE, Strength and sensation intact Incisions with Dermabond.Sites are clean, dry, and intact; Some bruising at posterior surgical site Abdomen without erythema   Disposition: Discharge disposition: 06-Home-Health Care Svc  Discharge Instructions    Face-to-face encounter (required for Medicare/Medicaid patients)   Complete by: As  directed    I Patricia Nettle certify that this patient is under my care and that I, or a nurse practitioner or physician's assistant working with me, had a face-to-face encounter that meets the physician face-to-face encounter requirements with this patient on 09/23/2019. The encounter with the patient was in whole, or in part for the following medical condition(s) which is the primary reason for home health care (List medical condition): Sttaus post lumbar fusion   The encounter with the patient was in whole, or in part, for the following medical condition, which is the primary reason for home health care: Status post l;umbar fusion   I certify that, based on my findings, the following services are medically necessary home health services: Physical therapy   Reason for Medically Necessary Home Health Services: Therapy- Therapeutic Exercises to Increase Strength and Endurance   My clinical findings support the need for the above services: Unable to leave home safely without assistance and/or assistive device   Further, I certify that my clinical findings support that this patient is homebound due to: Unable to leave home safely without assistance   Home Health   Complete by: As directed    To provide the following care/treatments:  PT OT       Allergies as of 09/23/2019      Reactions   Gabapentin Swelling   Keppra [levetiracetam] Nausea And Vomiting   Other Rash   Plastic tape: Paper tape ONLY!   Valium [diazepam] Other (See Comments)   Significant depression; "a different person"      Medication List    TAKE these medications   acetaminophen 500 MG tablet Commonly known as: TYLENOL Take 2 tablets (1,000 mg total) by mouth every 8 (eight) hours. What changed:   when to take this  reasons to take this   baclofen 20 MG tablet Commonly known as: LIORESAL Take 1 tablet (20 mg total) by mouth 3 (three) times daily. What changed: additional instructions   cephALEXin 500 MG  capsule Commonly known as: KEFLEX Take 1 capsule (500 mg total) by mouth 4 (four) times daily for 10 days.   cetirizine 10 MG tablet Commonly known as: ZYRTEC Take 10 mg by mouth daily.   docusate sodium 100 MG capsule Commonly known as: Colace Take 1 capsule (100 mg total) by mouth 2 (two) times daily.   lisinopril-hydrochlorothiazide 10-12.5 MG tablet Commonly known as: ZESTORETIC Take 1 tablet by mouth daily.   meclizine 25 MG tablet Commonly known as: ANTIVERT Take 25 mg by mouth 3 (three) times daily as needed for dizziness.   Melatonin 10 MG Caps Take 10 mg by mouth at bedtime.   methocarbamol 500 MG tablet Commonly known as: ROBAXIN Take 1 tablet (500 mg total) by mouth every 6 (six) hours as needed for muscle spasms.   morphine 30 MG 12 hr tablet Commonly known as: MS CONTIN Take 30 mg by mouth every 12 (twelve) hours. Take 8am and 8pm   multivitamin with minerals tablet Take 1 tablet by mouth daily. Spectravite   nitroGLYCERIN 0.4 MG SL tablet Commonly known as: NITROSTAT Place 1 tablet (0.4 mg total) under the tongue every 5 (five) minutes as needed for chest pain.   nystatin cream Commonly known as: MYCOSTATIN Apply 1 application topically daily as needed for dry skin.   Oxycodone HCl 10 MG Tabs Take 1 tablet (10 mg total) by mouth  every 4 (four) hours as needed for severe pain ((score 4 to 6)).   polyethylene glycol 17 g packet Commonly known as: MIRALAX / GLYCOLAX Take 17 g by mouth 2 (two) times daily.   pravastatin 80 MG tablet Commonly known as: PRAVACHOL Take 80 mg by mouth daily.   pregabalin 75 MG capsule Commonly known as: LYRICA TAKE ONE CAPSULE BY MOUTH THREE TIMES DAILY What changed:   how much to take  when to take this  additional instructions   promethazine 12.5 MG tablet Commonly known as: PHENERGAN Take 2 tablets (25 mg total) by mouth every 6 (six) hours as needed for nausea.   sildenafil 100 MG tablet Commonly known  as: VIAGRA Take 100 mg by mouth daily as needed for erectile dysfunction.   testosterone cypionate 200 MG/ML injection Commonly known as: DEPOTESTOSTERONE CYPIONATE Inject 200 mg into the muscle every 14 (fourteen) days.   traMADol 50 MG tablet Commonly known as: ULTRAM Take 1-2 tablets (50-100 mg total) by mouth every 6 (six) hours as needed for moderate pain or severe pain.   Vitamin D (Ergocalciferol) 1.25 MG (50000 UNIT) Caps capsule Commonly known as: DRISDOL Take 50,000 Units by mouth 2 (two) times a week.            Durable Medical Equipment  (From admission, onward)         Start     Ordered   09/22/19 1204  For home use only DME Cane  Once     09/22/19 1203   09/22/19 1203  For home use only DME 3 n 1  Once    Comments: Bariatric   09/22/19 1203         Follow-up Information    [facility] Follow up.   Why: Agency will contact you to arrange visits.  Contact information: [place] [phone]        Erline Levine, MD. Schedule an appointment as soon as possible for a visit in 2 week(s).   Specialty: Neurosurgery Contact information: [address] [place] [address] [phone]           Signed: Patricia Nettle 09/23/2019, 10:59 AM

## 2019-10-04 ENCOUNTER — Telehealth: Payer: Self-pay | Admitting: *Deleted

## 2019-10-04 NOTE — Telephone Encounter (Signed)
Spoke with patient wife she states patients incision from his surgery with Dr Vertell Limber assisted by Dr Carlis Abbott is red on one side and slightly distended she was questioning if he needed antibiotic. Advised her to contact Dr Vertell Limber to follow up. Advised her if patient has any drainage, or fever go to ER. She verbalized understanding she will contact Dr Melven Sartorius office now.

## 2020-06-19 ENCOUNTER — Other Ambulatory Visit: Payer: Self-pay | Admitting: Neurosurgery

## 2020-06-19 DIAGNOSIS — M5416 Radiculopathy, lumbar region: Secondary | ICD-10-CM

## 2020-06-25 ENCOUNTER — Ambulatory Visit
Admission: RE | Admit: 2020-06-25 | Discharge: 2020-06-25 | Disposition: A | Discharge status: Home / Self Care | Payer: Medicare Other | Source: Ambulatory Visit | Attending: Neurosurgery | Admitting: Neurosurgery

## 2020-06-25 DIAGNOSIS — M5416 Radiculopathy, lumbar region: Secondary | ICD-10-CM

## 2020-09-13 ENCOUNTER — Emergency Department
Admission: EM | Admit: 2020-09-13 | Discharge: 2020-09-13 | Disposition: A | Discharge status: Home / Self Care | Payer: Medicare Other | Attending: Emergency Medicine | Admitting: Emergency Medicine

## 2020-09-13 ENCOUNTER — Other Ambulatory Visit: Payer: Self-pay

## 2020-09-13 ENCOUNTER — Emergency Department: Payer: Medicare Other

## 2020-09-13 DIAGNOSIS — Z96651 Presence of right artificial knee joint: Secondary | ICD-10-CM | POA: Diagnosis not present

## 2020-09-13 DIAGNOSIS — R42 Dizziness and giddiness: Secondary | ICD-10-CM | POA: Diagnosis not present

## 2020-09-13 DIAGNOSIS — Z79899 Other long term (current) drug therapy: Secondary | ICD-10-CM | POA: Insufficient documentation

## 2020-09-13 DIAGNOSIS — Z87891 Personal history of nicotine dependence: Secondary | ICD-10-CM | POA: Insufficient documentation

## 2020-09-13 DIAGNOSIS — S20212A Contusion of left front wall of thorax, initial encounter: Secondary | ICD-10-CM | POA: Diagnosis not present

## 2020-09-13 DIAGNOSIS — R1012 Left upper quadrant pain: Secondary | ICD-10-CM | POA: Diagnosis not present

## 2020-09-13 DIAGNOSIS — W01190A Fall on same level from slipping, tripping and stumbling with subsequent striking against furniture, initial encounter: Secondary | ICD-10-CM | POA: Insufficient documentation

## 2020-09-13 DIAGNOSIS — S299XXA Unspecified injury of thorax, initial encounter: Secondary | ICD-10-CM | POA: Diagnosis present

## 2020-09-13 DIAGNOSIS — R109 Unspecified abdominal pain: Secondary | ICD-10-CM

## 2020-09-13 DIAGNOSIS — I10 Essential (primary) hypertension: Secondary | ICD-10-CM | POA: Diagnosis not present

## 2020-09-13 DIAGNOSIS — W19XXXA Unspecified fall, initial encounter: Secondary | ICD-10-CM

## 2020-09-13 LAB — COMPREHENSIVE METABOLIC PANEL
ALT: 19 U/L (ref 0–44)
AST: 22 U/L (ref 15–41)
Albumin: 3.9 g/dL (ref 3.5–5.0)
Alkaline Phosphatase: 43 U/L (ref 38–126)
Anion gap: 7 (ref 5–15)
BUN: 12 mg/dL (ref 8–23)
CO2: 28 mmol/L (ref 22–32)
Calcium: 8.8 mg/dL — ABNORMAL LOW (ref 8.9–10.3)
Chloride: 101 mmol/L (ref 98–111)
Creatinine, Ser: 0.97 mg/dL (ref 0.61–1.24)
GFR, Estimated: >60 mL/min (ref >60)
Glucose, Bld: 96 mg/dL (ref 70–99)
Potassium: 3.9 mmol/L (ref 3.5–5.1)
Sodium: 136 mmol/L (ref 135–145)
Total Bilirubin: 0.9 mg/dL (ref 0.3–1.2)
Total Protein: 6.4 g/dL — ABNORMAL LOW (ref 6.5–8.1)

## 2020-09-13 LAB — URINALYSIS, COMPLETE (UACMP) WITH MICROSCOPIC
Bacteria, UA: NONE SEEN
Bilirubin Urine: NEGATIVE
Glucose, UA: NEGATIVE mg/dL
Hgb urine dipstick: NEGATIVE
Ketones, ur: NEGATIVE mg/dL
Leukocytes,Ua: NEGATIVE
Nitrite: NEGATIVE
Protein, ur: NEGATIVE mg/dL
Specific Gravity, Urine: 1.02 (ref 1.005–1.030)
Squamous Epithelial / HPF: NONE SEEN (ref 0–5)
pH: 6 (ref 5.0–8.0)

## 2020-09-13 LAB — CBC WITH DIFFERENTIAL/PLATELET
Abs Immature Granulocytes: 0.06 10*3/uL (ref 0.00–0.07)
Basophils Absolute: 0 10*3/uL (ref 0.0–0.1)
Basophils Relative: 1 %
Eosinophils Absolute: 0.1 10*3/uL (ref 0.0–0.5)
Eosinophils Relative: 2 %
HCT: 54.1 % — ABNORMAL HIGH (ref 39.0–52.0)
Hemoglobin: 18.2 g/dL — ABNORMAL HIGH (ref 13.0–17.0)
Immature Granulocytes: 1 %
Lymphocytes Relative: 15 %
Lymphs Abs: 1.1 10*3/uL (ref 0.7–4.0)
MCH: 31.4 pg (ref 26.0–34.0)
MCHC: 33.6 g/dL (ref 30.0–36.0)
MCV: 93.3 fL (ref 80.0–100.0)
Monocytes Absolute: 1 10*3/uL (ref 0.1–1.0)
Monocytes Relative: 14 %
Neutro Abs: 5.1 10*3/uL (ref 1.7–7.7)
Neutrophils Relative %: 67 %
Platelets: 232 10*3/uL (ref 150–400)
RBC: 5.8 MIL/uL (ref 4.22–5.81)
RDW: 13.7 % (ref 11.5–15.5)
WBC: 7.4 10*3/uL (ref 4.0–10.5)
nRBC: 0 % (ref 0.0–0.2)

## 2020-09-13 LAB — TROPONIN I (HIGH SENSITIVITY): Troponin I (High Sensitivity): 5 ng/L (ref <18)

## 2020-09-13 MED ORDER — LIDOCAINE 5 % EX PTCH: 1.0000 | MEDICATED_PATCH | Freq: Two times a day (BID) | CUTANEOUS | 0 refills | Status: AC

## 2020-09-13 MED ORDER — HYDROMORPHONE HCL 1 MG/ML IJ SOLN
1.0000 mg | Freq: Once | INTRAMUSCULAR | Status: AC
  Administered 2020-09-13: 1 mg via INTRAVENOUS
  Filled 2020-09-13: qty 1

## 2020-09-13 MED ORDER — IOHEXOL 300 MG/ML  SOLN
125.0000 mL | Freq: Once | INTRAMUSCULAR | Status: AC | PRN
  Administered 2020-09-13: 125 mL via INTRAVENOUS
  Filled 2020-09-13: qty 125

## 2020-09-13 MED ORDER — ONDANSETRON HCL 4 MG/2ML IJ SOLN
4.0000 mg | Freq: Once | INTRAMUSCULAR | Status: AC
  Administered 2020-09-13: 4 mg via INTRAVENOUS
  Filled 2020-09-13: qty 2

## 2020-09-13 MED ORDER — LIDOCAINE 5 % EX PTCH
1.0000 | MEDICATED_PATCH | CUTANEOUS | Status: DC
  Administered 2020-09-13: 1 via TRANSDERMAL
  Filled 2020-09-13 (×2): qty 1

## 2020-09-13 MED ORDER — LORAZEPAM 2 MG/ML IJ SOLN
0.5000 mg | Freq: Once | INTRAMUSCULAR | Status: AC
  Administered 2020-09-13: 0.5 mg via INTRAVENOUS
  Filled 2020-09-13: qty 1

## 2020-09-13 MED ORDER — METAXALONE 800 MG PO TABS: 800.0000 mg | ORAL_TABLET | Freq: Three times a day (TID) | ORAL | 1 refills | Status: AC

## 2020-09-13 NOTE — ED Notes (Signed)
2nd RN at bedside signed as d/c witness under this RN's screen. This RN at bedside to assist with d/c. Pt wheeled out to vehicle.

## 2020-09-13 NOTE — ED Triage Notes (Signed)
Pt comes pov after a mechanical fall last Saturday. Pt fell and landed on right ribs and c/o lower back pain.

## 2020-09-13 NOTE — ED Provider Notes (Signed)
[facility] Emergency Department Provider Note  ____________________________________________   Event Date/Time   First MD Initiated Contact with Patient 09/13/20 1138     (approximate)  I have reviewed the triage vital signs and the nursing notes.   HISTORY  Chief Complaint Fall    HPI Darin Gomez is a 70 y.o. male presents emergency department complaining of left rib and left upper quadrant pain.  Patient was in standing position and fell on the deck landing directly on the left side approximately 1 week ago.  States he takes chronic pain medications which are not helping with his pain.  States increased pain over the last few days.  States he cannot take a deep breath and the pain is radiating into his back.  Has not noticed blood in his urine.  He denies any vomiting or diarrhea.    Past Medical History:  Diagnosis Date  . Allergy   . Arthritis    mainly in back  . Back pain   . Balance disorder   . Blood in urine   . Chest pain   . Constipation   . Diverticulosis 02/14/2018   noted on colonoscopy  . Environmental allergies   . Foot swelling    Bilateral  . GERD (gastroesophageal reflux disease)   . Headache(784.0)    occas.  migraines  (from last surgery)  . Heart murmur    as child....Marland Kitchenno problems now  . High cholesterol   . History of colon polyps 02/14/2018  . Hyperlipidemia   . Hypertension    dx  10 yrs or more  ago  . Insomnia   . Neck pain   . Neuropathy   . Night sweats   . Painful swelling of joint   . PONV (postoperative nausea and vomiting)   . Poor circulation of extremity    legs  . Shortness of breath   . Shortness of breath dyspnea    occasional  . Sinus disorder   . Spinal headache   . Vertigo     Patient Active Problem List   Diagnosis Date Noted  . Spinal stenosis of lumbar region with radiculopathy 09/14/2019  . Trochanteric bursitis of left hip 04/12/2019  . S/P right TKA 02/08/2019  . History of  total knee arthroplasty 02/08/2019  . Chronic pain syndrome 08/28/2018  . History of lumbar laminectomy (2012, 2013) 08/28/2018  . Spinal cord stimulator status (2015, [place]) 08/28/2018  . Chronic lumbar radiculopathy 08/28/2018  . Lumbar degenerative disc disease 08/28/2018  . Lumbar radiculopathy 08/28/2018  . Degeneration of lumbar intervertebral disc 08/28/2018  . Pain in right knee 12/05/2017  . Chest discomfort 03/01/2014    Past Surgical History:  Procedure Laterality Date  . ABDOMINAL EXPOSURE N/A 09/14/2019   Procedure: ABDOMINAL EXPOSURE;  Surgeon: [name], MD;  Location: [place];  Service: Vascular;  Laterality: N/A;  . ANTERIOR LAT LUMBAR FUSION Right 09/14/2019   Procedure: Right Lumbar three-four Lumbar four-five  Anterolateral lumbar interbody fusion;  Surgeon: Erline Levine, MD;  Location: [place];  Service: Neurosurgery;  Laterality: Right;  . ANTERIOR LUMBAR FUSION N/A 09/14/2019   Procedure: Lumbar five Sacral one Anterior lumbar interbody fusion;  Surgeon: Erline Levine, MD;  Location: [place];  Service: Neurosurgery;  Laterality: N/A;  . BACK SURGERY    . CARDIAC CATHETERIZATION     03/04/14  . COLONOSCOPY    . FRACTURE SURGERY     left arm  . KNEE SURGERY  Right 01/02/2018   torn minicus  . LEFT HEART CATHETERIZATION WITH CORONARY ANGIOGRAM N/A 03/04/2014   Procedure: LEFT HEART CATHETERIZATION WITH CORONARY ANGIOGRAM;  Surgeon: Peter M Martinique, MD;  Location: [facility] CATH LAB;  Service: Cardiovascular;  Laterality: N/A;  . LUMBAR LAMINECTOMY/DECOMPRESSION MICRODISCECTOMY  03/15/2012   Procedure: LUMBAR LAMINECTOMY/DECOMPRESSION MICRODISCECTOMY 2 LEVELS;  Surgeon: Floyce Stakes, MD;  Location: [place] NEURO ORS;  Service: Neurosurgery;  Laterality: Bilateral;  Bilateral Lumbar three, lumbar four, lumbar five Laminectomy  . LUMBAR PERCUTANEOUS PEDICLE SCREW 3 LEVEL N/A 09/14/2019   Procedure: Lumbar three to Sacral one posterior pedicle screw  fixation;  Surgeon: Erline Levine, MD;  Location: [place];  Service: Neurosurgery;  Laterality: N/A;  . POLYPECTOMY    . SPINAL CORD STIMULATOR INSERTION N/A 05/29/2014   Procedure: LUMBAR SPINAL CORD STIMULATOR INSERTION;  Surgeon: Bonna Gains, MD;  Location: [facility]UR[facility];  Service: Neurosurgery;  Laterality: N/A;  . TOTAL KNEE ARTHROPLASTY Right 02/08/2019   Procedure: TOTAL KNEE ARTHROPLASTY;  Surgeon: Paralee Cancel, MD;  Location: [facility];  Service: Orthopedics;  Laterality: Right;  70 mins    Prior to Admission medications   Medication Sig Start Date End Date Taking? Authorizing Provider  lidocaine (LIDODERM) 5 % Place 1 patch onto the skin every 12 (twelve) hours. Remove & Discard patch within 12 hours or as directed by MD 09/13/20 09/13/21 Yes Artice Bergerson, Linden Dolin, PA-C  metaxalone (SKELAXIN) 800 MG tablet Take 1 tablet (800 mg total) by mouth 3 (three) times daily. 09/13/20 09/13/21 Yes Jalaila Caradonna, Linden Dolin, PA-C  acetaminophen (TYLENOL) 500 MG tablet Take 2 tablets (1,000 mg total) by mouth every 8 (eight) hours. Patient taking differently: Take 1,000 mg by mouth every 8 (eight) hours as needed for moderate pain.  02/09/19   Danae Orleans, PA-C  cetirizine (ZYRTEC) 10 MG tablet Take 10 mg by mouth daily.     [provider]  docusate sodium (COLACE) 100 MG capsule Take 1 capsule (100 mg total) by mouth 2 (two) times daily. 02/09/19   Danae Orleans, PA-C  lisinopril-hydrochlorothiazide (PRINZIDE,ZESTORETIC) 10-12.5 MG per tablet Take 1 tablet by mouth daily.    [provider]  meclizine (ANTIVERT) 25 MG tablet Take 25 mg by mouth 3 (three) times daily as needed for dizziness.     [provider]  Melatonin 10 MG CAPS Take 10 mg by mouth at bedtime.    [provider]  methocarbamol (ROBAXIN) 500 MG tablet Take 1 tablet (500 mg total) by mouth every 6 (six) hours as needed for muscle spasms. 09/23/19   Viona Gilmore D, NP  morphine (MS CONTIN) 30 MG 12 hr tablet  Take 30 mg by mouth every 12 (twelve) hours. Take 8am and 8pm    [provider]  Multiple Vitamins-Minerals (MULTIVITAMIN WITH MINERALS) tablet Take 1 tablet by mouth daily. Spectravite    [provider]  nitroGLYCERIN (NITROSTAT) 0.4 MG SL tablet Place 1 tablet (0.4 mg total) under the tongue every 5 (five) minutes as needed for chest pain. 03/01/14   Nahser, Wonda Cheng, MD  nystatin cream (MYCOSTATIN) Apply 1 application topically daily as needed for dry skin.     [provider]  oxyCODONE 10 MG TABS Take 1 tablet (10 mg total) by mouth every 4 (four) hours as needed for severe pain ((score 4 to 6)). 09/23/19   Viona Gilmore D, NP  polyethylene glycol (MIRALAX / GLYCOLAX) 17 g packet Take 17 g by mouth 2 (two) times daily. 02/09/19  Danae Orleans, PA-C  pravastatin (PRAVACHOL) 80 MG tablet Take 80 mg by mouth daily.    [provider]  pregabalin (LYRICA) 75 MG capsule TAKE ONE CAPSULE BY MOUTH THREE TIMES DAILY  Patient taking differently: Take 75-150 mg by mouth See admin instructions. Takes at [place], 8PM,m 75 mg in the morning, 150 mg twice daily 12/13/18   Gillis Santa, MD  promethazine (PHENERGAN) 12.5 MG tablet Take 2 tablets (25 mg total) by mouth every 6 (six) hours as needed for nausea. 03/26/12   Newman Pies, MD  sildenafil (VIAGRA) 100 MG tablet Take 100 mg by mouth daily as needed for erectile dysfunction.    [provider]  testosterone cypionate (DEPOTESTOSTERONE CYPIONATE) 200 MG/ML injection Inject 200 mg into the muscle every 14 (fourteen) days. 08/21/19   [provider]  traMADol (ULTRAM) 50 MG tablet Take 1-2 tablets (50-100 mg total) by mouth every 6 (six) hours as needed for moderate pain or severe pain. 02/09/19   Danae Orleans, PA-C  Vitamin D, Ergocalciferol, (DRISDOL) 50000 UNITS CAPS capsule Take 50,000 Units by mouth 2 (two) times a week.     [provider]    Allergies Gabapentin, Keppra  [levetiracetam], Other, and Valium [diazepam]  Family History  Problem Relation Age of Onset  . Heart attack Mother   . Hypertension Mother   . Hyperlipidemia Mother   . Diabetes Mother   . Cancer Father   . Heart attack Brother 24  . Breast cancer Neg Hx   . Colon polyps Neg Hx   . Colon cancer Neg Hx   . Esophageal cancer Neg Hx   . Liver cancer Neg Hx   . Ovarian cancer Neg Hx   . Pancreatic cancer Neg Hx   . Prostate cancer Neg Hx   . Rectal cancer Neg Hx   . Stomach cancer Neg Hx     Social History Social History   Tobacco Use  . Smoking status: Former Smoker    Years: 45.00    Types: Cigarettes  . Smokeless tobacco: Former Systems developer    Types: Chew  . Tobacco comment: quit smoking cigarettes > 30 years ago  Vaping Use  . Vaping Use: Never used  Substance Use Topics  . Alcohol use: Yes    Alcohol/week: 5.0 - 9.0 standard drinks    Types: 3 - 4 Glasses of wine, 2 Shots of liquor per week    Comment: occasional beer  . Drug use: Never    Review of Systems  Constitutional: No fever/chills Eyes: No visual changes. ENT: No sore throat. Respiratory: Denies cough Cardiovascular: Denies chest pain Gastrointestinal: Positive for left upper quadrant abdominal pain Genitourinary: Negative for dysuria. Musculoskeletal: Positive for back pain.  And left rib pain Skin: Negative for rash. Psychiatric: no mood changes,     ____________________________________________   PHYSICAL EXAM:  VITAL SIGNS: ED Triage Vitals  Enc Vitals Group     BP 09/13/20 1139 (!) 157/98     Pulse Rate 09/13/20 1139 70     Resp 09/13/20 1139 18     Temp 09/13/20 1139 98.2 F (36.8 C)     Temp Source 09/13/20 1139 Oral     SpO2 09/13/20 1139 91 %     Weight 09/13/20 1138 298 lb (135.2 kg)     Height 09/13/20 1138 6' (1.829 m)     Head Circumference --      Peak Flow --      Pain Score 09/13/20 1137  7     Pain Loc --      Pain Edu? --      Excl. in [place]? --     Constitutional:  Alert and oriented. Well appearing and in no acute distress. Eyes: Conjunctivae are normal.  Head: Atraumatic. Nose: No congestion/rhinnorhea. Mouth/Throat: Mucous membranes are moist.   Neck:  supple no lymphadenopathy noted Cardiovascular: Normal rate, regular rhythm. Heart sounds are normal Respiratory: Normal respiratory effort.  No retractions, lungs c t a, left ribs tender to palpation in the upper aspect Abd: soft tender in the left upper quadrant bs normal all 4 quad GU: deferred Musculoskeletal: FROM all extremities, warm and well perfused Neurologic:  Normal speech and language.  Skin:  Skin is warm, dry and intact. No rash noted. Psychiatric: Mood and affect are normal. Speech and behavior are normal.  ____________________________________________   LABS (all labs ordered are listed, but only abnormal results are displayed)  Labs Reviewed  COMPREHENSIVE METABOLIC PANEL - Abnormal; Notable for the following components:      Result Value   Calcium 8.8 (*)    Total Protein 6.4 (*)    All other components within normal limits  CBC WITH DIFFERENTIAL/PLATELET - Abnormal; Notable for the following components:   Hemoglobin 18.2 (*)    HCT 54.1 (*)    All other components within normal limits  URINALYSIS, COMPLETE (UACMP) WITH MICROSCOPIC - Abnormal; Notable for the following components:   Color, Urine YELLOW (*)    APPearance CLEAR (*)    All other components within normal limits  TROPONIN I (HIGH SENSITIVITY)   ____________________________________________   ____________________________________________  RADIOLOGY  CT chest abdomen pelvis  ____________________________________________   PROCEDURES  Procedure(s) performed: No  Procedures    ____________________________________________   INITIAL IMPRESSION / ASSESSMENT AND PLAN / ED COURSE  Pertinent labs & imaging results that were available during my care of the patient were reviewed by me and considered  in my medical decision making (see chart for details).   Patient is a 70 year old male presents after a fall.  Patient also has severe vertigo.  States feels like the room is spinning, denies head injury, has chronic history of vertigo, see HPI.  Physical exam shows patient appears stable at this time  DDx: Left rib fracture, splenic laceration, kidney laceration, MI, pneumothorax  CBC, metabolic panel, troponin, UA, CT chest abdomen pelvis ordered   Will treat his pain with Dilaudid 1 mg IV, Zofran IV, will wait to see if these medications help with pain and then discuss options for vertigo  Ativan 0.5 mg for vertigo  Labs are reassuring, appear to be stable, CBC, urinalysis, troponin and comprehensive metabolic panel basically normal, patient may have a small amount of dehydration due to the hemoglobin and hematocrit being elevated  CT reviewed by me, confirmed by radiology to be in negative for any acute abnormality EKG showed normal sinus rhythm, see physician read  The patient is much more comfortable after being given the Dilaudid and the vertigo has improved with the Ativan.  I feel he is stable to go home.  We discussed pain and chronic pain control.  He will be given a Lidoderm patch here in the ED and prescription for 1.  Trial of Skelaxin as he states he is on Robaxin at this time.  He is to follow-up with his chronic pain clinic physician.  Return emergency department if worsening.  He and his wife both state they understand.  He was discharged in  stable condition.  Darin Gomez was evaluated in Emergency Department on 09/13/2020 for the symptoms described in the history of present illness. He was evaluated in the context of the global COVID-19 pandemic, which necessitated consideration that the patient might be at risk for infection with the SARS-CoV-2 virus that causes COVID-19. Institutional protocols and algorithms that pertain to the evaluation of patients at risk for COVID-19  are in a state of rapid change based on information released by regulatory bodies including the CDC and federal and state organizations. These policies and algorithms were followed during the patient's care in the ED.    As part of my medical decision making, I reviewed the following data within the [place] notes reviewed and incorporated, Labs reviewed , EKG interpreted NSR, Old chart reviewed, Radiograph reviewed , Notes from prior ED visits and [place] Controlled Substance Database  ____________________________________________   FINAL CLINICAL IMPRESSION(S) / ED DIAGNOSES  Final diagnoses:  Abdominal pain  Fall, initial encounter  Chest wall contusion, left, initial encounter      NEW MEDICATIONS STARTED DURING THIS VISIT:  Discharge Medication List as of 09/13/2020  3:12 PM    START taking these medications   Details  lidocaine (LIDODERM) 5 % Place 1 patch onto the skin every 12 (twelve) hours. Remove & Discard patch within 12 hours or as directed by MD, Starting Sat 09/13/2020, Until Sun 09/13/2021, Normal    metaxalone (SKELAXIN) 800 MG tablet Take 1 tablet (800 mg total) by mouth 3 (three) times daily., Starting Sat 09/13/2020, Until Sun 09/13/2021, Normal         Note:  This document was prepared using Dragon voice recognition software and may include unintentional dictation errors.    Versie Starks, PA-C 09/13/20 1707    Naaman Plummer, MD 09/14/20 [phone]

## 2020-09-13 NOTE — ED Notes (Addendum)
Pt away at imaging. Wife remains at bedside.

## 2021-01-27 ENCOUNTER — Ambulatory Visit (INDEPENDENT_AMBULATORY_CARE_PROVIDER_SITE_OTHER): Payer: Medicare Other | Admitting: Cardiovascular Disease

## 2021-01-27 ENCOUNTER — Encounter: Payer: Self-pay | Admitting: Cardiovascular Disease

## 2021-01-27 ENCOUNTER — Other Ambulatory Visit: Payer: Self-pay

## 2021-01-27 VITALS — BP 160/70 | HR 76 | Ht 72.0 in | Wt 300.0 lb

## 2021-01-27 DIAGNOSIS — I700 Atherosclerosis of aorta: Secondary | ICD-10-CM | POA: Diagnosis not present

## 2021-01-27 DIAGNOSIS — R079 Chest pain, unspecified: Secondary | ICD-10-CM | POA: Diagnosis not present

## 2021-01-27 DIAGNOSIS — E782 Mixed hyperlipidemia: Secondary | ICD-10-CM | POA: Diagnosis not present

## 2021-01-27 DIAGNOSIS — I10 Essential (primary) hypertension: Secondary | ICD-10-CM

## 2021-01-27 MED ORDER — PRAVASTATIN SODIUM 80 MG PO TABS: 80.0000 mg | ORAL_TABLET | Freq: Every day | ORAL | 3 refills | Status: DC

## 2021-01-27 MED ORDER — NITROGLYCERIN 0.4 MG SL SUBL: 0.4000 mg | SUBLINGUAL_TABLET | SUBLINGUAL | 3 refills | Status: AC | PRN

## 2021-01-27 MED ORDER — LISINOPRIL-HYDROCHLOROTHIAZIDE 10-12.5 MG PO TABS: 1.0000 | ORAL_TABLET | Freq: Every day | ORAL | 3 refills | Status: AC

## 2021-01-27 NOTE — Patient Instructions (Addendum)
Medication Instructions:  No changes  If you need a refill on your cardiac medications before your next appointment, please call your pharmacy.   Lab work: No new labs needed  Testing/Procedures: No new testing needed  Follow-Up: At [facility], you and your health needs are our priority.  As part of our continuing mission to provide you with exceptional heart care, we have created designated Provider Care Teams.  These Care Teams include your primary Cardiologist (physician) and Advanced Practice Providers (APPs -  Physician Assistants and Nurse Practitioners) who all work together to provide you with the care you need, when you need it.  You will need a follow up appointment as needed  Providers on your designated Care Team:   Murray Hodgkins, NP Christell Faith, PA-C Marrianne Mood, PA-C Cadence Mcclenny, [place]  COVID-19 Vaccine Information can be found at: [email or web address] For questions related to vaccine distribution or appointments, please email [email or web address] or call [phone].

## 2021-01-27 NOTE — Progress Notes (Signed)
Cardiology Office Note  Date:  01/27/2021   ID:  Darin, Gomez March 01, 1951, MRN [id number]  PCP:  Cyndi Bender, PA-C   Chief Complaint  Patient presents with   New Patient (Initial Visit)    Establish care for CAD; s/p cardiac cath 2015. Patient c/o chest pain and shortness of breath. Medications reviewed by the patient verbally.     HPI:  Darin Gomez is a 70 year old gentleman with past medical history of Hypertension Chronic pain syndrome, cord stimulator neuropathy History of falls, April 2022, lumbar fusion May 2021 Previously evaluated 2015 for chest pain Who presents to establish care in the [place] office for consultation of his chest pain symptoms  Feet are numb Some sharp chest pains, pain seems to come around the side from his back Some SOB Sedentary, no exercise  Home blood pressure 130/70 Elevated in the office, having back pain after walking long way into the office  CT scan chest abdomen pelvis April 2022 Minimal ABD aorta calcification Images pulled up and reviewed, no carotid calcification, no coronary calcification  Work-up 2015 including stress test at [facility] revealed lateral ischemia, Cardiac catheterization at that time only minor coronary artery disease per the notes  02/2014 Left mainstem: Normal Left anterior descending (LAD): Normal Left circumflex (LCx): The LCx gives rise to 2 large OM branches. There are minor irregularities in the mid vessel less than 10%. Right coronary artery (RCA): There is mild ectasia in the mid vessel, otherwise normal. Left ventriculography: Left ventricular systolic function is normal, LVEF is estimated at 55-65%, there is no significant mitral regurgitation   1. No significant CAD 2. Normal LV function.   PMH:   has a past medical history of Allergy, Arthritis, Back pain, Balance disorder, Blood in urine, Chest pain, Constipation, Diverticulosis (02/14/2018), Environmental  allergies, Foot swelling, GERD (gastroesophageal reflux disease), Headache(784.0), Heart murmur, High cholesterol, History of colon polyps (02/14/2018), Hyperlipidemia, Hypertension, Insomnia, Neck pain, Neuropathy, Night sweats, Painful swelling of joint, PONV (postoperative nausea and vomiting), Poor circulation of extremity, Shortness of breath, Shortness of breath dyspnea, Sinus disorder, Spinal headache, and Vertigo.  PSH:    Past Surgical History:  Procedure Laterality Date   ABDOMINAL EXPOSURE N/A 09/14/2019   Procedure: ABDOMINAL EXPOSURE;  Surgeon: [name], MD;  Location: [place];  Service: Vascular;  Laterality: N/A;   ANTERIOR LAT LUMBAR FUSION Right 09/14/2019   Procedure: Right Lumbar three-four Lumbar four-five  Anterolateral lumbar interbody fusion;  Surgeon: Erline Levine, MD;  Location: [place];  Service: Neurosurgery;  Laterality: Right;   ANTERIOR LUMBAR FUSION N/A 09/14/2019   Procedure: Lumbar five Sacral one Anterior lumbar interbody fusion;  Surgeon: Erline Levine, MD;  Location: [place];  Service: Neurosurgery;  Laterality: N/A;   BACK SURGERY     CARDIAC CATHETERIZATION     03/04/14   COLONOSCOPY     FRACTURE SURGERY     left arm   KNEE SURGERY Right 01/02/2018   torn minicus   LEFT HEART CATHETERIZATION WITH CORONARY ANGIOGRAM N/A 03/04/2014   Procedure: LEFT HEART CATHETERIZATION WITH CORONARY ANGIOGRAM;  Surgeon: Peter M Martinique, MD;  Location: [facility] C[facility] LAB;  Service: Cardiovascular;  Laterality: N/A;   LUMBAR LAMINECTOMY/DECOMPRESSION MICRODISCECTOMY  03/15/2012   Procedure: LUMBAR LAMINECTOMY/DECOMPRESSION MICRODISCECTOMY 2 LEVELS;  Surgeon: Floyce Stakes, MD;  Location: [place] [facility];  Service: Neurosurgery;  Laterality: Bilateral;  Bilateral Lumbar three, lumbar four, lumbar five Laminectomy   LUMBAR PERCUTANEOUS PEDICLE SCREW 3 LEVEL N/A 09/14/2019  Procedure: Lumbar three to Sacral one posterior pedicle screw fixation;  Surgeon: Erline Levine, MD;   Location: [place];  Service: Neurosurgery;  Laterality: N/A;   POLYPECTOMY     SPINAL CORD STIMULATOR INSERTION N/A 05/29/2014   Procedure: LUMBAR SPINAL CORD STIMULATOR INSERTION;  Surgeon: Bonna Gains, MD;  Location: [place] [facility];  Service: Neurosurgery;  Laterality: N/A;   TOTAL KNEE ARTHROPLASTY Right 02/08/2019   Procedure: TOTAL KNEE ARTHROPLASTY;  Surgeon: Paralee Cancel, MD;  Location: [facility];  Service: Orthopedics;  Laterality: Right;  70 mins    Current Outpatient Medications  Medication Sig Dispense Refill   aspirin 81 MG chewable tablet Chew 81 mg by mouth daily.     cetirizine (ZYRTEC) 10 MG tablet Take 10 mg by mouth daily.      docusate sodium (COLACE) 100 MG capsule Take 1 capsule (100 mg total) by mouth 2 (two) times daily. 28 capsule 0   lidocaine (LIDODERM) 5 % Place 1 patch onto the skin every 12 (twelve) hours. Remove & Discard patch within 12 hours or as directed by MD 10 patch 0   lisinopril-hydrochlorothiazide (PRINZIDE,ZESTORETIC) 10-12.5 MG per tablet Take 1 tablet by mouth daily.     meclizine (ANTIVERT) 25 MG tablet Take 25 mg by mouth 3 (three) times daily as needed for dizziness.      Melatonin 10 MG CAPS Take 10 mg by mouth at bedtime.     metaxalone (SKELAXIN) 800 MG tablet Take 1 tablet (800 mg total) by mouth 3 (three) times daily. 90 tablet 1   methocarbamol (ROBAXIN) 750 MG tablet Take 750 mg by mouth 3 (three) times daily.     morphine (MS CONTIN) 30 MG 12 hr tablet Take 30 mg by mouth every 12 (twelve) hours. Take 8am and 8pm     Multiple Vitamins-Minerals (MULTIVITAMIN WITH MINERALS) tablet Take 1 tablet by mouth daily. Spectravite     nitroGLYCERIN (NITROSTAT) 0.4 MG SL tablet Place 1 tablet (0.4 mg total) under the tongue every 5 (five) minutes as needed for chest pain. 25 tablet 3   nystatin cream (MYCOSTATIN) Apply 1 application topically daily as needed for dry skin.      oxyCODONE 10 MG TABS Take 1 tablet (10 mg total) by mouth every 4 (four) hours  as needed for severe pain ((score 4 to 6)). 30 tablet 0   polyethylene glycol (MIRALAX / GLYCOLAX) 17 g packet Take 17 g by mouth 2 (two) times daily. 28 packet 0   polyethylene glycol powder (GLYCOLAX/MIRALAX) 17 GM/SCOOP powder Take 0.5 Containers by mouth once.     pravastatin (PRAVACHOL) 80 MG tablet Take 80 mg by mouth daily.     pregabalin (LYRICA) 75 MG capsule TAKE ONE CAPSULE BY MOUTH THREE TIMES DAILY  (Patient taking differently: Take 75-150 mg by mouth See admin instructions. Takes at [place], 8PM,m 75 mg in the morning, 150 mg twice daily) 90 capsule 0   promethazine (PHENERGAN) 12.5 MG tablet Take 2 tablets (25 mg total) by mouth every 6 (six) hours as needed for nausea. 30 tablet 1   sildenafil (VIAGRA) 100 MG tablet Take 100 mg by mouth daily as needed for erectile dysfunction.     testosterone cypionate (DEPOTESTOSTERONE CYPIONATE) 200 MG/ML injection Inject 200 mg into the muscle every 14 (fourteen) days.     Vitamin D, Ergocalciferol, (DRISDOL) 50000 UNITS CAPS capsule Take 50,000 Units by mouth 2 (two) times a week.      acetaminophen (TYLENOL) 500 MG tablet  Take 2 tablets (1,000 mg total) by mouth every 8 (eight) hours. (Patient not taking: Reported on 01/27/2021) 30 tablet 0   traMADol (ULTRAM) 50 MG tablet Take 1-2 tablets (50-100 mg total) by mouth every 6 (six) hours as needed for moderate pain or severe pain. (Patient not taking: Reported on 01/27/2021) 40 tablet 0   No current facility-administered medications for this visit.     Allergies:   Gabapentin, Keppra [levetiracetam], Other, and Valium [diazepam]   Social History:  The patient  reports that he has quit smoking. His smoking use included cigarettes. He has quit using smokeless tobacco.  His smokeless tobacco use included chew. He reports current alcohol use of about 5.0 - 9.0 standard drinks per week. He reports that he does not use drugs.   Family History:   family history includes Cancer in his father; Diabetes  in his mother; Heart attack in his mother; Heart attack (age of onset: 97) in his brother; Heart disease in his brother; Hyperlipidemia in his brother and mother; Hypertension in his brother and mother.    Review of Systems: Review of Systems  Constitutional: Negative.   HENT: Negative.    Respiratory:  Positive for shortness of breath.   Cardiovascular: Negative.   Gastrointestinal: Negative.   Musculoskeletal: Negative.   Neurological: Negative.   Psychiatric/Behavioral: Negative.    All other systems reviewed and are negative.   PHYSICAL EXAM: VS:  BP (!) 160/70 (BP Location: Right Arm, Patient Position: Sitting, Cuff Size: Normal)   Pulse 76   Ht 6' (1.829 m)   Wt 300 lb (136.1 kg)   SpO2 97%   BMI 40.69 kg/m  , BMI Body mass index is 40.69 kg/m. GEN: Well nourished, well developed, in no acute distress HEENT: normal Neck: no JVD, carotid bruits, or masses Cardiac: RRR; no murmurs, rubs, or gallops,no edema  Respiratory:  clear to auscultation bilaterally, normal work of breathing GI: soft, nontender, nondistended, + BS MS: no deformity or atrophy Skin: warm and dry, no rash Neuro:  Strength and sensation are intact Psych: euthymic mood, full affect  Recent Labs: 09/13/2020: ALT 19; BUN 12; Creatinine, Ser 0.97; Hemoglobin 18.2; Platelets 232; Potassium 3.9; Sodium 136    Lipid Panel No results found for: CHOL, HDL, LDLCALC, TRIG    Wt Readings from Last 3 Encounters:  01/27/21 300 lb (136.1 kg)  09/13/20 298 lb (135.2 kg)  09/14/19 (!) 301 lb 2.4 oz (136.6 kg)      ASSESSMENT AND PLAN:  Problem List Items Addressed This Visit   None Visit Diagnoses     Aortic atherosclerosis (HCC)    -  Primary   Relevant Medications   aspirin 81 MG chewable tablet   Mixed hyperlipidemia       Relevant Medications   aspirin 81 MG chewable tablet   Benign essential HTN       Relevant Medications   aspirin 81 MG chewable tablet   Chest pain of uncertain etiology           Aortic atherosclerosis Minimal noted in the abdominal aortic area, no change to his medications, no further work-up needed  Chest pain Atypical in nature, likely musculoskeletal No coronary calcification noted on recent CT scan chest Prior cardiac catheterization over 5 years ago no coronary disease Nondiabetic, no smoking, cholesterol at goal Long discussion with him, no further testing plan  Hyperlipidemia Cholesterol is at goal on the current lipid regimen. No changes to the medications were made.  Hypertension  Blood pressure elevated in the office but well controlled at home Recommend he monitor pressures at home for now, no medication changes made    Total encounter time more than 60 minutes  Greater than 50% was spent in counseling and coordination of care with the patient    Signed, Esmond Plants, M.D., Ph.D. [place], [place]

## 2021-07-28 ENCOUNTER — Other Ambulatory Visit: Payer: Self-pay | Admitting: Neurosurgery

## 2021-07-28 DIAGNOSIS — Z9689 Presence of other specified functional implants: Secondary | ICD-10-CM

## 2021-07-31 ENCOUNTER — Ambulatory Visit
Admission: RE | Admit: 2021-07-31 | Discharge: 2021-07-31 | Disposition: A | Discharge status: Home / Self Care | Payer: Medicare Other | Source: Ambulatory Visit | Attending: Neurosurgery | Admitting: Neurosurgery

## 2021-07-31 ENCOUNTER — Other Ambulatory Visit: Payer: Self-pay

## 2021-07-31 DIAGNOSIS — Z9689 Presence of other specified functional implants: Secondary | ICD-10-CM

## 2021-07-31 MED ORDER — MEPERIDINE HCL 50 MG/ML IJ SOLN
50.0000 mg | Freq: Once | INTRAMUSCULAR | Status: AC | PRN
  Administered 2021-07-31: 75 mg via INTRAMUSCULAR

## 2021-07-31 MED ORDER — IOPAMIDOL (ISOVUE-M 200) INJECTION 41%
20.0000 mL | Freq: Once | INTRAMUSCULAR | Status: AC
  Administered 2021-07-31: 20 mL via INTRATHECAL

## 2021-07-31 MED ORDER — ONDANSETRON HCL 4 MG/2ML IJ SOLN
4.0000 mg | Freq: Once | INTRAMUSCULAR | Status: AC | PRN
  Administered 2021-07-31: 4 mg via INTRAMUSCULAR

## 2021-07-31 NOTE — Discharge Instructions (Signed)

## 2021-07-31 NOTE — Discharge Instr - Other Orders (Addendum)
1020: Back pain 9/10 prior to myelogram procedure, see MAR. Pt has intolerance to Valium.  ?4461: Pt reports pain decreased, 5/10.  ?

## 2021-07-31 NOTE — Progress Notes (Signed)
Pt verbalized he turned off spinal cord stimulator w/remote prior to arrival for CT Myelogram procedure.  ?

## 2021-08-26 ENCOUNTER — Other Ambulatory Visit: Payer: Self-pay | Admitting: Neurological Surgery

## 2021-09-02 NOTE — Pre-Procedure Instructions (Signed)
Surgical Instructions ? ? ? Your procedure is scheduled on Monday, April 17th. ? Report to [facility] Main Entrance "A" at 08:00 A.M., then check in with the Admitting office. ? Call this number if you have problems the morning of surgery: ? [phone] ? ? If you have any questions prior to your surgery date call [phone]: Open Monday-Friday 8am-4pm ? ? ? Remember: ? Do not eat or drink after midnight the night before your surgery ? ?  ? Take these medicines the morning of surgery with A SIP OF WATER  ?cetirizine (ZYRTEC) ?famotidine (PEPCID)  ?methocarbamol (ROBAXIN)  ?morphine (MS CONTIN)  ?pravastatin ?pregabalin (LYRICA)  ? ? ?If needed: ?oxyCODONE  ?sodium chloride (OCEAN) 0.65 % SOLN nasal spray ? ?Follow your surgeon's instructions on when to stop Aspirin.  If no instructions were given by your surgeon then you will need to call the office to get those instructions.    ? ? ?As of today, STOP taking any Aleve, Naproxen, Ibuprofen, Motrin, Advil, Goody's, BC's, all herbal medications, fish oil, and all vitamins. ?         ?           ?Do NOT Smoke (Tobacco/Vaping) for 24 hours prior to your procedure. ? ?If you use a CPAP at night, you may bring your mask/headgear for your overnight stay. ?  ?Contacts, glasses, piercing's, hearing aid's, dentures or partials may not be worn into surgery, please bring cases for these belongings.  ?  ?For patients admitted to the hospital, discharge time will be determined by your treatment team. ?  ?Patients discharged the day of surgery will not be allowed to drive home, and someone needs to stay with them for 24 hours. ? ?SURGICAL WAITING ROOM VISITATION ?Patients having surgery or a procedure may have two support people in the waiting room. These visitors may be switched out with other visitors if needed. ?Children under the age of 85 must have an adult accompany them who is not the patient. ?If the patient needs to stay at the hospital during part of their recovery,  the visitor guidelines for inpatient rooms apply. ? ?Please refer to the [facility] website for the visitor guidelines for Inpatients (after your surgery is over and you are in a regular room).  ? ? ?Special instructions:   ?[facility]- Preparing For Surgery ? ?Before surgery, you can play an important role. Because skin is not sterile, your skin needs to be as free of germs as possible. You can reduce the number of germs on your skin by washing with CHG (chlorahexidine gluconate) Soap before surgery.  CHG is an antiseptic cleaner which kills germs and bonds with the skin to continue killing germs even after washing.   ? ?Oral Hygiene is also important to reduce your risk of infection.  Remember - BRUSH YOUR TEETH THE MORNING OF SURGERY WITH YOUR REGULAR TOOTHPASTE ? ?Please do not use if you have an allergy to CHG or antibacterial soaps. If your skin becomes reddened/irritated stop using the CHG.  ?Do not shave (including legs and underarms) for at least 48 hours prior to first CHG shower. It is OK to shave your face. ? ?Please follow these instructions carefully. ?  ?Shower the NIGHT BEFORE SURGERY and the MORNING OF SURGERY ? ?If you chose to wash your hair, wash your hair first as usual with your normal shampoo. ? ?After you shampoo, rinse your hair and body thoroughly to remove the shampoo. ? ?Use CHG Soap as  you would any other liquid soap. You can apply CHG directly to the skin and wash gently with a scrungie or a clean washcloth.  ? ?Apply the CHG Soap to your body ONLY FROM THE NECK DOWN.  Do not use on open wounds or open sores. Avoid contact with your eyes, ears, mouth and genitals (private parts). Wash Face and genitals (private parts)  with your normal soap.  ? ?Wash thoroughly, paying special attention to the area where your surgery will be performed. ? ?Thoroughly rinse your body with warm water from the neck down. ? ?DO NOT shower/wash with your normal soap after using and rinsing off the CHG  Soap. ? ?Pat yourself dry with a CLEAN TOWEL. ? ?Wear CLEAN PAJAMAS to bed the night before surgery ? ?Place CLEAN SHEETS on your bed the night before your surgery ? ?DO NOT SLEEP WITH PETS. ? ? ?Day of Surgery: ?Take a shower with CHG soap. ?Do not wear jewelry  ?Do not wear lotions, powders, colognes, or deodorant. ?Do not shave 48 hours prior to surgery.  Men may shave face and neck. ?Do not bring valuables to the hospital.  ?[facility] is not responsible for any belongings or valuables. ? ?Wear Clean/Comfortable clothing the morning of surgery ?Remember to brush your teeth WITH YOUR REGULAR TOOTHPASTE. ?  ?Please read over the following fact sheets that you were given. ? ? ? ?If you received a COVID test during your pre-op visit  it is requested that you wear a mask when out in public, stay away from anyone that may not be feeling well and notify your surgeon if you develop symptoms. If you have been in contact with anyone that has tested positive in the last 10 days please notify you surgeon.  ?

## 2021-09-04 ENCOUNTER — Encounter (HOSPITAL_COMMUNITY): Payer: Self-pay

## 2021-09-04 ENCOUNTER — Encounter (HOSPITAL_COMMUNITY)
Admission: RE | Admit: 2021-09-04 | Discharge: 2021-09-04 | Disposition: A | Discharge status: Home / Self Care | Payer: Medicare Other | Source: Ambulatory Visit | Attending: Neurological Surgery | Admitting: Neurological Surgery

## 2021-09-04 ENCOUNTER — Other Ambulatory Visit: Payer: Self-pay

## 2021-09-04 VITALS — BP 149/78 | HR 62 | Temp 98.1°F | Resp 17 | Ht 73.0 in | Wt 294.7 lb

## 2021-09-04 DIAGNOSIS — R0789 Other chest pain: Secondary | ICD-10-CM | POA: Insufficient documentation

## 2021-09-04 DIAGNOSIS — Z01818 Encounter for other preprocedural examination: Secondary | ICD-10-CM

## 2021-09-04 DIAGNOSIS — Z01812 Encounter for preprocedural laboratory examination: Secondary | ICD-10-CM | POA: Insufficient documentation

## 2021-09-04 HISTORY — DX: Myoneural disorder, unspecified: G70.9

## 2021-09-04 LAB — SURGICAL PCR SCREEN
MRSA, PCR: NEGATIVE
Staphylococcus aureus: NEGATIVE

## 2021-09-04 LAB — BASIC METABOLIC PANEL
Anion gap: 6 (ref 5–15)
BUN: 15 mg/dL (ref 8–23)
CO2: 28 mmol/L (ref 22–32)
Calcium: 8.9 mg/dL (ref 8.9–10.3)
Chloride: 103 mmol/L (ref 98–111)
Creatinine, Ser: 1.04 mg/dL (ref 0.61–1.24)
GFR, Estimated: >60 mL/min (ref >60)
Glucose, Bld: 97 mg/dL (ref 70–99)
Potassium: 4.3 mmol/L (ref 3.5–5.1)
Sodium: 137 mmol/L (ref 135–145)

## 2021-09-04 LAB — CBC
HCT: 53.2 % — ABNORMAL HIGH (ref 39.0–52.0)
Hemoglobin: 18.4 g/dL — ABNORMAL HIGH (ref 13.0–17.0)
MCH: 32.5 pg (ref 26.0–34.0)
MCHC: 34.6 g/dL (ref 30.0–36.0)
MCV: 93.8 fL (ref 80.0–100.0)
Platelets: 239 10*3/uL (ref 150–400)
RBC: 5.67 MIL/uL (ref 4.22–5.81)
RDW: 13.2 % (ref 11.5–15.5)
WBC: 7.1 10*3/uL (ref 4.0–10.5)
nRBC: 0 % (ref 0.0–0.2)

## 2021-09-04 LAB — PROTIME-INR
INR: 1 (ref 0.8–1.2)
Prothrombin Time: 13.1 seconds (ref 11.4–15.2)

## 2021-09-04 NOTE — Progress Notes (Signed)
Spoke with Lab regarding T&S.  Patient did not sign Patient Transfusion History Sheet at PAT appt.  Patient to sign form on DOS.  Per Blood Bank,  do not need another sample, just the form (to be sent on DOS). ?

## 2021-09-04 NOTE — Progress Notes (Signed)
DUE TO COVID-19 ONLY TWO VISITORS ARE ALLOWED TO COME WITH YOU AND STAY IN THE SURGICAL WAITING ROOM ONLY DURING PRE OP AND PROCEDURE DAY OF SURGERY.  ? ?Two VISITORS MAY VISIT WITH YOU AFTER SURGERY IN YOUR PRIVATE ROOM DURING VISITING HOURS ONLY! ? ?PCP - Cyndi Bender, PA-C ?Cardiologist - Dr Ida Rogue ? ?Chest x-ray - n/a ?EKG - 01/27/21 ?Stress Test - 04/10/14 ?ECHO - n/a ?Cardiac Cath - 03/04/14 ? ?ICD Pacemaker/Loop - n/a ? ?Sleep Study -  n/a ?CPAP - none ? ?Aspirin Instructions: Follow your surgeon's instructions on when to stop aspirin prior to surgery.  Patient to call MD's office for ASA instructions prior to surgery.  Patient agreed to call MD. ? ?Anesthesia review: Yes ? ?STOP now taking any Aspirin (unless otherwise instructed by your surgeon), Aleve, Naproxen, Ibuprofen, Motrin, Advil, Goody's, BC's, all herbal medications, fish oil, and all vitamins.  ? ?Coronavirus Screening ?Do you have any of the following symptoms:  ?Cough yes/no: No ?Fever (>100.42F)  yes/no: No ?Runny nose yes/no: No ?Sore throat yes/no: No ?Difficulty breathing/shortness of breath  yes/no: No ? ?Have you traveled in the last 14 days and where? yes/no: No ? ?Patient verbalized understanding of instructions that were given to them at the PAT appointment. Patient was also instructed that they will need to review over the PAT instructions again at home before surgery. ? ?

## 2021-09-07 NOTE — Anesthesia Preprocedure Evaluation (Addendum)
Anesthesia Evaluation  ?Patient identified by MRN, date of birth, ID band ?Patient awake ? ? ? ?Reviewed: ?Allergy & Precautions, H&P , NPO status , Patient's Chart, lab work & pertinent test results ? ?History of Anesthesia Complications ?(+) PONV and history of anesthetic complications ? ?Airway ?Mallampati: II ? ?TM Distance: >3 FB ?Neck ROM: full ? ? ? Dental ?no notable dental hx. ? ?  ?Pulmonary ?shortness of breath, former smoker,  ?  ?Pulmonary exam normal ?breath sounds clear to auscultation ? ? ? ? ? ? Cardiovascular ?hypertension, + Peripheral Vascular Disease  ?Normal cardiovascular exam ?Rhythm:regular Rate:Normal ? ? ?  ?Neuro/Psych ? Headaches,  Neuromuscular disease   ? GI/Hepatic ?GERD  ,  ?Endo/Other  ? ? Renal/GU ?  ? ?  ?Musculoskeletal ? ?(+) Arthritis , Osteoarthritis,   ? Abdominal ?  ?Peds ? Hematology ?  ?Anesthesia Other Findings ? ? Reproductive/Obstetrics ? ?  ? ? ? ? ? ? ? ? ? ? ? ? ? ?  ?  ? ? ? ? ? ? ?Anesthesia Physical ?Anesthesia Plan ? ?ASA: 2 ? ?Anesthesia Plan: General  ? ?Post-op Pain Management:   ? ?Induction: Intravenous ? ?PONV Risk Score and Plan: 3 and Ondansetron, Dexamethasone, Midazolam and Treatment may vary due to age or medical condition ? ?Airway Management Planned: Oral ETT ? ?Additional Equipment:  ? ?Intra-op Plan:  ? ?Post-operative Plan: Extubation in OR ? ?Informed Consent: I have reviewed the patients History and Physical, chart, labs and discussed the procedure including the risks, benefits and alternatives for the proposed anesthesia with the patient or authorized representative who has indicated his/her understanding and acceptance.  ? ? ? ?Dental advisory given ? ?Plan Discussed with: CRNA, Anesthesiologist and Surgeon ? ?Anesthesia Plan Comments: (PAT note by Karoline Caldwell, PA-C: ?Follows with cardiology for history of atypical chest pain.  Last seen by Dr. Rockey Situ 01/27/2021.  Per note, "Chest pain. Atypical in nature,  likely musculoskeletal. No coronary calcification noted on recent CT scan chest. Prior cardiac catheterization over 5 years ago no coronary disease ?Nondiabetic, no smoking, cholesterol at goal. Long discussion with him, no further testing plan." ? ?Preop labs reviewed, unremarkable. ? ?EKG 01/27/2021: NSR. Rate 76. ? ?Cath 03/04/2014: ?Final Conclusions: ? ?1. No significant CAD ?2. Normal LV function. ? ? ?)  ? ? ? ? ? ?Anesthesia Quick Evaluation ? ?

## 2021-09-07 NOTE — Progress Notes (Signed)
Anesthesia Chart Review: ? ?Follows with cardiology for history of atypical chest pain.  Last seen by Dr. Rockey Situ 01/27/2021.  Per note, "Chest pain. Atypical in nature, likely musculoskeletal. No coronary calcification noted on recent CT scan chest. Prior cardiac catheterization over 5 years ago no coronary disease ?Nondiabetic, no smoking, cholesterol at goal. Long discussion with him, no further testing plan." ? ?Preop labs reviewed, unremarkable. ? ?EKG 01/27/2021: NSR. Rate 76. ? ?Cath 03/04/2014: ?Final Conclusions:   ?1. No significant CAD ?2. Normal LV function. ? ? ? ?Karoline Caldwell, PA-C ?[facility] Short Stay Center/Anesthesiology ?Phone [phone] ?09/07/2021 1:39 PM ? ?

## 2021-09-14 ENCOUNTER — Other Ambulatory Visit: Payer: Self-pay

## 2021-09-14 ENCOUNTER — Inpatient Hospital Stay (HOSPITAL_COMMUNITY)
Admission: RE | Admit: 2021-09-14 | Discharge: 2021-09-16 | DRG: 455 | Disposition: A | Discharge status: Home / Self Care | Payer: Medicare Other | Source: Ambulatory Visit | Attending: Neurological Surgery | Admitting: Neurological Surgery

## 2021-09-14 ENCOUNTER — Inpatient Hospital Stay (HOSPITAL_COMMUNITY): Payer: Medicare Other | Admitting: Vascular Surgery

## 2021-09-14 ENCOUNTER — Inpatient Hospital Stay (HOSPITAL_COMMUNITY): Payer: Medicare Other | Admitting: Certified Registered"

## 2021-09-14 ENCOUNTER — Encounter (HOSPITAL_COMMUNITY): Payer: Self-pay | Admitting: Neurological Surgery

## 2021-09-14 ENCOUNTER — Inpatient Hospital Stay (HOSPITAL_COMMUNITY): Payer: Medicare Other

## 2021-09-14 ENCOUNTER — Inpatient Hospital Stay (HOSPITAL_COMMUNITY)
Admission: RE | Disposition: A | Discharge status: Home / Self Care | Payer: Self-pay | Source: Ambulatory Visit | Attending: Neurological Surgery

## 2021-09-14 DIAGNOSIS — Z981 Arthrodesis status: Secondary | ICD-10-CM | POA: Diagnosis not present

## 2021-09-14 DIAGNOSIS — M5416 Radiculopathy, lumbar region: Principal | ICD-10-CM | POA: Diagnosis present

## 2021-09-14 DIAGNOSIS — Z79899 Other long term (current) drug therapy: Secondary | ICD-10-CM | POA: Diagnosis not present

## 2021-09-14 DIAGNOSIS — Z8249 Family history of ischemic heart disease and other diseases of the circulatory system: Secondary | ICD-10-CM | POA: Diagnosis not present

## 2021-09-14 DIAGNOSIS — I10 Essential (primary) hypertension: Secondary | ICD-10-CM | POA: Diagnosis present

## 2021-09-14 DIAGNOSIS — Z96651 Presence of right artificial knee joint: Secondary | ICD-10-CM | POA: Diagnosis present

## 2021-09-14 DIAGNOSIS — Z885 Allergy status to narcotic agent status: Secondary | ICD-10-CM | POA: Diagnosis not present

## 2021-09-14 DIAGNOSIS — M48061 Spinal stenosis, lumbar region without neurogenic claudication: Secondary | ICD-10-CM

## 2021-09-14 DIAGNOSIS — Z87891 Personal history of nicotine dependence: Secondary | ICD-10-CM | POA: Diagnosis not present

## 2021-09-14 DIAGNOSIS — K219 Gastro-esophageal reflux disease without esophagitis: Secondary | ICD-10-CM | POA: Diagnosis present

## 2021-09-14 DIAGNOSIS — Z4542 Encounter for adjustment and management of neuropacemaker (brain) (peripheral nerve) (spinal cord): Secondary | ICD-10-CM

## 2021-09-14 DIAGNOSIS — G4700 Insomnia, unspecified: Secondary | ICD-10-CM | POA: Diagnosis present

## 2021-09-14 DIAGNOSIS — Z888 Allergy status to other drugs, medicaments and biological substances status: Secondary | ICD-10-CM

## 2021-09-14 DIAGNOSIS — Z833 Family history of diabetes mellitus: Secondary | ICD-10-CM | POA: Diagnosis not present

## 2021-09-14 DIAGNOSIS — Z809 Family history of malignant neoplasm, unspecified: Secondary | ICD-10-CM | POA: Diagnosis not present

## 2021-09-14 DIAGNOSIS — Z83438 Family history of other disorder of lipoprotein metabolism and other lipidemia: Secondary | ICD-10-CM | POA: Diagnosis not present

## 2021-09-14 DIAGNOSIS — Z7982 Long term (current) use of aspirin: Secondary | ICD-10-CM

## 2021-09-14 DIAGNOSIS — I739 Peripheral vascular disease, unspecified: Secondary | ICD-10-CM | POA: Diagnosis present

## 2021-09-14 HISTORY — PX: SPINAL CORD STIMULATOR BATTERY EXCHANGE: SHX6202

## 2021-09-14 LAB — TYPE AND SCREEN
ABO/RH(D): A POS
Antibody Screen: NEGATIVE

## 2021-09-14 SURGERY — POSTERIOR LUMBAR FUSION 1 LEVEL
Anesthesia: General | Site: Spine Lumbar

## 2021-09-14 MED ORDER — BUPIVACAINE HCL (PF) 0.25 % IJ SOLN
INTRAMUSCULAR | Status: AC
  Filled 2021-09-14: qty 30

## 2021-09-14 MED ORDER — CEFAZOLIN SODIUM-DEXTROSE 2-4 GM/100ML-% IV SOLN
2.0000 g | Freq: Three times a day (TID) | INTRAVENOUS | Status: AC
  Administered 2021-09-14 – 2021-09-15 (×2): 2 g via INTRAVENOUS
  Filled 2021-09-14 (×2): qty 100

## 2021-09-14 MED ORDER — DEXAMETHASONE SODIUM PHOSPHATE 10 MG/ML IJ SOLN
INTRAMUSCULAR | Status: DC | PRN
  Administered 2021-09-14: 10 mg via INTRAVENOUS

## 2021-09-14 MED ORDER — ACETAMINOPHEN 500 MG PO TABS
1000.0000 mg | ORAL_TABLET | Freq: Four times a day (QID) | ORAL | Status: AC
  Administered 2021-09-14 – 2021-09-15 (×4): 1000 mg via ORAL
  Filled 2021-09-14 (×4): qty 2

## 2021-09-14 MED ORDER — ROCURONIUM BROMIDE 10 MG/ML (PF) SYRINGE
PREFILLED_SYRINGE | INTRAVENOUS | Status: AC
  Filled 2021-09-14: qty 10

## 2021-09-14 MED ORDER — PHENOL 1.4 % MT LIQD: 1.0000 | OROMUCOSAL | Status: DC | PRN

## 2021-09-14 MED ORDER — SODIUM CHLORIDE 0.9 % IV SOLN
250.0000 mL | INTRAVENOUS | Status: DC
  Administered 2021-09-14: 250 mL via INTRAVENOUS

## 2021-09-14 MED ORDER — BUPIVACAINE HCL (PF) 0.25 % IJ SOLN
INTRAMUSCULAR | Status: DC | PRN
  Administered 2021-09-14: 4 mL

## 2021-09-14 MED ORDER — HYDROMORPHONE HCL 1 MG/ML IJ SOLN
INTRAMUSCULAR | Status: AC
  Filled 2021-09-14: qty 0.5

## 2021-09-14 MED ORDER — HYDROXYZINE HCL 25 MG PO TABS
25.0000 mg | ORAL_TABLET | Freq: Every day | ORAL | Status: DC
  Administered 2021-09-14 – 2021-09-15 (×2): 25 mg via ORAL
  Filled 2021-09-14 (×2): qty 1

## 2021-09-14 MED ORDER — PROPOFOL 500 MG/50ML IV EMUL
INTRAVENOUS | Status: DC | PRN
  Administered 2021-09-14: 50 ug/kg/min via INTRAVENOUS

## 2021-09-14 MED ORDER — THROMBIN 20000 UNITS EX SOLR
CUTANEOUS | Status: AC
  Filled 2021-09-14: qty 20000

## 2021-09-14 MED ORDER — ASPIRIN 81 MG PO CHEW
81.0000 mg | CHEWABLE_TABLET | Freq: Every day | ORAL | Status: DC
  Administered 2021-09-14 – 2021-09-15 (×2): 81 mg via ORAL
  Filled 2021-09-14 (×2): qty 1

## 2021-09-14 MED ORDER — FENTANYL CITRATE (PF) 250 MCG/5ML IJ SOLN
INTRAMUSCULAR | Status: AC
  Filled 2021-09-14: qty 5

## 2021-09-14 MED ORDER — ALBUMIN HUMAN 5 % IV SOLN: INTRAVENOUS | Status: DC | PRN

## 2021-09-14 MED ORDER — METHOCARBAMOL 750 MG PO TABS
750.0000 mg | ORAL_TABLET | Freq: Three times a day (TID) | ORAL | Status: DC
  Administered 2021-09-14 – 2021-09-16 (×6): 750 mg via ORAL
  Filled 2021-09-14 (×6): qty 1

## 2021-09-14 MED ORDER — OXYCODONE HCL 5 MG/5ML PO SOLN: 5.0000 mg | Freq: Once | ORAL | Status: DC | PRN

## 2021-09-14 MED ORDER — DIPHENHYDRAMINE HCL 50 MG/ML IJ SOLN
INTRAMUSCULAR | Status: DC | PRN
  Administered 2021-09-14: 12.5 mg via INTRAVENOUS

## 2021-09-14 MED ORDER — ROCURONIUM BROMIDE 10 MG/ML (PF) SYRINGE
PREFILLED_SYRINGE | INTRAVENOUS | Status: AC
  Filled 2021-09-14: qty 20

## 2021-09-14 MED ORDER — PREGABALIN 75 MG PO CAPS
150.0000 mg | ORAL_CAPSULE | Freq: Every day | ORAL | Status: DC
  Administered 2021-09-14 – 2021-09-15 (×2): 150 mg via ORAL
  Filled 2021-09-14 (×2): qty 2

## 2021-09-14 MED ORDER — LACTATED RINGERS IV SOLN: INTRAVENOUS | Status: DC

## 2021-09-14 MED ORDER — ACETAMINOPHEN 10 MG/ML IV SOLN
INTRAVENOUS | Status: AC
  Filled 2021-09-14: qty 100

## 2021-09-14 MED ORDER — LIDOCAINE 2% (20 MG/ML) 5 ML SYRINGE
INTRAMUSCULAR | Status: DC | PRN
  Administered 2021-09-14: 100 mg via INTRAVENOUS

## 2021-09-14 MED ORDER — CELECOXIB 200 MG PO CAPS
200.0000 mg | ORAL_CAPSULE | Freq: Two times a day (BID) | ORAL | Status: DC
  Administered 2021-09-14 – 2021-09-16 (×4): 200 mg via ORAL
  Filled 2021-09-14 (×4): qty 1

## 2021-09-14 MED ORDER — ONDANSETRON HCL 4 MG/2ML IJ SOLN: 4.0000 mg | Freq: Four times a day (QID) | INTRAMUSCULAR | Status: DC | PRN

## 2021-09-14 MED ORDER — FENTANYL CITRATE (PF) 100 MCG/2ML IJ SOLN
INTRAMUSCULAR | Status: AC
  Filled 2021-09-14: qty 2

## 2021-09-14 MED ORDER — OXYCODONE HCL 5 MG PO TABS: 5.0000 mg | ORAL_TABLET | Freq: Once | ORAL | Status: DC | PRN

## 2021-09-14 MED ORDER — ORAL CARE MOUTH RINSE: 15.0000 mL | Freq: Once | OROMUCOSAL | Status: AC

## 2021-09-14 MED ORDER — FENTANYL CITRATE (PF) 100 MCG/2ML IJ SOLN
25.0000 ug | INTRAMUSCULAR | Status: DC | PRN
  Administered 2021-09-14 (×3): 50 ug via INTRAVENOUS

## 2021-09-14 MED ORDER — PROPOFOL 10 MG/ML IV BOLUS
INTRAVENOUS | Status: DC | PRN
  Administered 2021-09-14: 200 mg via INTRAVENOUS

## 2021-09-14 MED ORDER — HYDROMORPHONE HCL 1 MG/ML IJ SOLN
1.0000 mg | INTRAMUSCULAR | Status: DC | PRN
  Administered 2021-09-14 – 2021-09-15 (×2): 1 mg via INTRAVENOUS
  Filled 2021-09-14 (×2): qty 1

## 2021-09-14 MED ORDER — LIDOCAINE 2% (20 MG/ML) 5 ML SYRINGE
INTRAMUSCULAR | Status: AC
  Filled 2021-09-14: qty 5

## 2021-09-14 MED ORDER — PANTOPRAZOLE SODIUM 40 MG IV SOLR
40.0000 mg | Freq: Every day | INTRAVENOUS | Status: DC
  Administered 2021-09-14: 40 mg via INTRAVENOUS
  Filled 2021-09-14: qty 10

## 2021-09-14 MED ORDER — LISINOPRIL 10 MG PO TABS
10.0000 mg | ORAL_TABLET | Freq: Every day | ORAL | Status: DC
  Administered 2021-09-14 – 2021-09-16 (×3): 10 mg via ORAL
  Filled 2021-09-14 (×3): qty 1

## 2021-09-14 MED ORDER — HYDROCHLOROTHIAZIDE 12.5 MG PO TABS
12.5000 mg | ORAL_TABLET | Freq: Every day | ORAL | Status: DC
  Administered 2021-09-14 – 2021-09-16 (×3): 12.5 mg via ORAL
  Filled 2021-09-14 (×3): qty 1

## 2021-09-14 MED ORDER — THROMBIN 5000 UNITS EX SOLR
CUTANEOUS | Status: AC
  Filled 2021-09-14: qty 5000

## 2021-09-14 MED ORDER — THROMBIN 5000 UNITS EX SOLR: OROMUCOSAL | Status: DC | PRN

## 2021-09-14 MED ORDER — KETAMINE HCL 50 MG/5ML IJ SOSY
PREFILLED_SYRINGE | INTRAMUSCULAR | Status: AC
  Filled 2021-09-14: qty 5

## 2021-09-14 MED ORDER — MENTHOL 3 MG MT LOZG
1.0000 | LOZENGE | OROMUCOSAL | Status: DC | PRN
  Filled 2021-09-14 (×2): qty 9

## 2021-09-14 MED ORDER — DEXAMETHASONE 4 MG PO TABS
4.0000 mg | ORAL_TABLET | Freq: Four times a day (QID) | ORAL | Status: DC
  Administered 2021-09-15 (×2): 4 mg via ORAL
  Filled 2021-09-14 (×2): qty 1

## 2021-09-14 MED ORDER — ONDANSETRON HCL 4 MG PO TABS: 4.0000 mg | ORAL_TABLET | Freq: Four times a day (QID) | ORAL | Status: DC | PRN

## 2021-09-14 MED ORDER — CHLORHEXIDINE GLUCONATE CLOTH 2 % EX PADS
6.0000 | MEDICATED_PAD | Freq: Once | CUTANEOUS | Status: DC
Start: 2021-09-14 — End: 2021-09-14

## 2021-09-14 MED ORDER — EPHEDRINE SULFATE-NACL 50-0.9 MG/10ML-% IV SOSY
PREFILLED_SYRINGE | INTRAVENOUS | Status: DC | PRN
  Administered 2021-09-14: 10 mg via INTRAVENOUS

## 2021-09-14 MED ORDER — 0.9 % SODIUM CHLORIDE (POUR BTL) OPTIME
TOPICAL | Status: DC | PRN
  Administered 2021-09-14: 1000 mL

## 2021-09-14 MED ORDER — SUGAMMADEX SODIUM 500 MG/5ML IV SOLN
INTRAVENOUS | Status: AC
  Filled 2021-09-14: qty 5

## 2021-09-14 MED ORDER — THROMBIN 20000 UNITS EX SOLR: CUTANEOUS | Status: DC | PRN

## 2021-09-14 MED ORDER — ONDANSETRON HCL 4 MG/2ML IJ SOLN
INTRAMUSCULAR | Status: DC | PRN
  Administered 2021-09-14: 4 mg via INTRAVENOUS

## 2021-09-14 MED ORDER — SUGAMMADEX SODIUM 200 MG/2ML IV SOLN
INTRAVENOUS | Status: DC | PRN
  Administered 2021-09-14: 500 mg via INTRAVENOUS

## 2021-09-14 MED ORDER — SODIUM CHLORIDE 0.9% FLUSH: 3.0000 mL | INTRAVENOUS | Status: DC | PRN

## 2021-09-14 MED ORDER — SODIUM CHLORIDE 0.9% FLUSH
3.0000 mL | Freq: Two times a day (BID) | INTRAVENOUS | Status: DC
  Administered 2021-09-15 – 2021-09-16 (×3): 3 mL via INTRAVENOUS

## 2021-09-14 MED ORDER — ACETAMINOPHEN 10 MG/ML IV SOLN
INTRAVENOUS | Status: DC | PRN
  Administered 2021-09-14: 1000 mg via INTRAVENOUS

## 2021-09-14 MED ORDER — OXYCODONE HCL 5 MG PO TABS
15.0000 mg | ORAL_TABLET | ORAL | Status: DC | PRN
  Administered 2021-09-14 – 2021-09-16 (×11): 15 mg via ORAL
  Filled 2021-09-14 (×11): qty 3

## 2021-09-14 MED ORDER — CHLORHEXIDINE GLUCONATE 0.12 % MT SOLN
15.0000 mL | Freq: Once | OROMUCOSAL | Status: AC
  Administered 2021-09-14: 15 mL via OROMUCOSAL
  Filled 2021-09-14: qty 15

## 2021-09-14 MED ORDER — PROPOFOL 10 MG/ML IV BOLUS
INTRAVENOUS | Status: AC
  Filled 2021-09-14: qty 20

## 2021-09-14 MED ORDER — POTASSIUM CHLORIDE IN NACL 20-0.9 MEQ/L-% IV SOLN: INTRAVENOUS | Status: DC

## 2021-09-14 MED ORDER — HYDROMORPHONE HCL 1 MG/ML IJ SOLN
INTRAMUSCULAR | Status: DC | PRN
  Administered 2021-09-14: .5 mg via INTRAVENOUS

## 2021-09-14 MED ORDER — HYDROMORPHONE HCL 1 MG/ML IJ SOLN
INTRAMUSCULAR | Status: AC
  Filled 2021-09-14: qty 1

## 2021-09-14 MED ORDER — PREGABALIN 75 MG PO CAPS: 75.0000 mg | ORAL_CAPSULE | ORAL | Status: DC

## 2021-09-14 MED ORDER — PREGABALIN 75 MG PO CAPS
75.0000 mg | ORAL_CAPSULE | Freq: Two times a day (BID) | ORAL | Status: DC
  Administered 2021-09-15 – 2021-09-16 (×3): 75 mg via ORAL
  Filled 2021-09-14 (×3): qty 1

## 2021-09-14 MED ORDER — DEXAMETHASONE SODIUM PHOSPHATE 4 MG/ML IJ SOLN
4.0000 mg | Freq: Four times a day (QID) | INTRAMUSCULAR | Status: DC
  Administered 2021-09-14 – 2021-09-16 (×6): 4 mg via INTRAVENOUS
  Filled 2021-09-14 (×6): qty 1

## 2021-09-14 MED ORDER — CEFAZOLIN IN SODIUM CHLORIDE 3-0.9 GM/100ML-% IV SOLN
3.0000 g | INTRAVENOUS | Status: AC
  Administered 2021-09-14: 3 g via INTRAVENOUS
  Filled 2021-09-14: qty 100

## 2021-09-14 MED ORDER — HYDROMORPHONE HCL 1 MG/ML IJ SOLN
0.5000 mg | INTRAMUSCULAR | Status: DC | PRN
  Administered 2021-09-14 (×2): 0.5 mg via INTRAVENOUS

## 2021-09-14 MED ORDER — KETAMINE HCL 10 MG/ML IJ SOLN
INTRAMUSCULAR | Status: DC | PRN
  Administered 2021-09-14 (×5): 10 mg via INTRAVENOUS

## 2021-09-14 MED ORDER — ROCURONIUM BROMIDE 100 MG/10ML IV SOLN
INTRAVENOUS | Status: DC | PRN
  Administered 2021-09-14: 20 mg via INTRAVENOUS
  Administered 2021-09-14: 30 mg via INTRAVENOUS
  Administered 2021-09-14: 20 mg via INTRAVENOUS
  Administered 2021-09-14: 100 mg via INTRAVENOUS
  Administered 2021-09-14: 30 mg via INTRAVENOUS

## 2021-09-14 MED ORDER — SENNA 8.6 MG PO TABS
1.0000 | ORAL_TABLET | Freq: Two times a day (BID) | ORAL | Status: DC
  Administered 2021-09-14 – 2021-09-15 (×2): 8.6 mg via ORAL
  Filled 2021-09-14 (×2): qty 1

## 2021-09-14 MED ORDER — FENTANYL CITRATE (PF) 100 MCG/2ML IJ SOLN
INTRAMUSCULAR | Status: DC | PRN
  Administered 2021-09-14 (×2): 20 ug via INTRAVENOUS
  Administered 2021-09-14: 50 ug via INTRAVENOUS
  Administered 2021-09-14: 20 ug via INTRAVENOUS
  Administered 2021-09-14 (×3): 100 ug via INTRAVENOUS

## 2021-09-14 MED ORDER — LISINOPRIL-HYDROCHLOROTHIAZIDE 10-12.5 MG PO TABS: 1.0000 | ORAL_TABLET | Freq: Every day | ORAL | Status: DC

## 2021-09-14 SURGICAL SUPPLY — 60 items
ADH SKN CLS APL DERMABOND .7 (GAUZE/BANDAGES/DRESSINGS) ×4
APL SKNCLS STERI-STRIP NONHPOA (GAUZE/BANDAGES/DRESSINGS) ×4
BAG COUNTER SPONGE SURGICOUNT (BAG) ×8 IMPLANT
BAG SPNG CNTER NS LX DISP (BAG) ×4
BASKET BONE COLLECTION (BASKET) ×3 IMPLANT
BENZOIN TINCTURE PRP APPL 2/3 (GAUZE/BANDAGES/DRESSINGS) ×8 IMPLANT
BONE MATRIX OSTEOCEL PRO MED (Bone Implant) ×1 IMPLANT
BUR CARBIDE MATCH 3.0 (BURR) ×4 IMPLANT
CAGE COROENT LRG MP 11X9X28-8 (Cage) ×2 IMPLANT
CANISTER SUCT 3000ML PPV (MISCELLANEOUS) ×8 IMPLANT
CNTNR URN SCR LID CUP LEK RST (MISCELLANEOUS) ×3 IMPLANT
CONT SPEC 4OZ STRL OR WHT (MISCELLANEOUS) ×3
COVER BACK TABLE 60X90IN (DRAPES) ×4 IMPLANT
DERMABOND ADVANCED (GAUZE/BANDAGES/DRESSINGS) ×2
DERMABOND ADVANCED .7 DNX12 (GAUZE/BANDAGES/DRESSINGS) ×3 IMPLANT
DRAPE C-ARM 42X72 X-RAY (DRAPES) ×8 IMPLANT
DRAPE LAPAROTOMY 100X72X124 (DRAPES) ×7 IMPLANT
DRSG OPSITE POSTOP 4X6 (GAUZE/BANDAGES/DRESSINGS) ×1 IMPLANT
DRSG OPSITE POSTOP 4X8 (GAUZE/BANDAGES/DRESSINGS) ×1 IMPLANT
DURAPREP 26ML APPLICATOR (WOUND CARE) ×7 IMPLANT
ELECT REM PT RETURN 9FT ADLT (ELECTROSURGICAL) ×3
ELECTRODE REM PT RTRN 9FT ADLT (ELECTROSURGICAL) ×6 IMPLANT
EVACUATOR 1/8 PVC DRAIN (DRAIN) ×3 IMPLANT
GLOVE BIO SURGEON STRL SZ7 (GLOVE) ×2 IMPLANT
GLOVE BIO SURGEON STRL SZ8 (GLOVE) ×12 IMPLANT
GLOVE BIOGEL PI IND STRL 7.0 (GLOVE) IMPLANT
GLOVE BIOGEL PI IND STRL 7.5 (GLOVE) IMPLANT
GLOVE BIOGEL PI INDICATOR 7.0 (GLOVE) ×2
GLOVE BIOGEL PI INDICATOR 7.5 (GLOVE) ×3
GLOVE ECLIPSE 7.0 STRL STRAW (GLOVE) ×4 IMPLANT
GLOVE ECLIPSE 7.5 STRL STRAW (GLOVE) ×2 IMPLANT
GOWN STRL REUS W/ TWL LRG LVL3 (GOWN DISPOSABLE) IMPLANT
GOWN STRL REUS W/ TWL XL LVL3 (GOWN DISPOSABLE) ×9 IMPLANT
GOWN STRL REUS W/TWL 2XL LVL3 (GOWN DISPOSABLE) IMPLANT
GOWN STRL REUS W/TWL LRG LVL3 (GOWN DISPOSABLE)
GOWN STRL REUS W/TWL XL LVL3 (GOWN DISPOSABLE) ×9
HEMOSTAT POWDER KIT SURGIFOAM (HEMOSTASIS) ×5 IMPLANT
KIT BASIN OR (CUSTOM PROCEDURE TRAY) ×7 IMPLANT
KIT TURNOVER KIT B (KITS) ×7 IMPLANT
MILL MEDIUM DISP (BLADE) ×4 IMPLANT
NDL HYPO 25X1 1.5 SAFETY (NEEDLE) ×6 IMPLANT
NEEDLE HYPO 25X1 1.5 SAFETY (NEEDLE) ×3 IMPLANT
NS IRRIG 1000ML POUR BTL (IV SOLUTION) ×7 IMPLANT
PACK LAMINECTOMY NEURO (CUSTOM PROCEDURE TRAY) ×7 IMPLANT
RASP 3.0MM (RASP) ×2 IMPLANT
ROD RELINE LOROTIC TI 5.5X55MM (Rod) ×1 IMPLANT
ROD RELINE-O LORD 5.5X50MM (Rod) ×1 IMPLANT
SCREW LOCK RELINE 5.5 TULIP (Screw) ×4 IMPLANT
SCREW RELINE-O POLY 6.5X50MM (Screw) ×2 IMPLANT
SPONGE SURGIFOAM ABS GEL 100 (HEMOSTASIS) ×1 IMPLANT
STRIP CLOSURE SKIN 1/2X4 (GAUZE/BANDAGES/DRESSINGS) ×11 IMPLANT
SUT SILK 3 0 SH 30 (SUTURE) IMPLANT
SUT VIC AB 0 CT1 18XCR BRD8 (SUTURE) ×6 IMPLANT
SUT VIC AB 0 CT1 8-18 (SUTURE) ×6
SUT VIC AB 2-0 CP2 18 (SUTURE) ×8 IMPLANT
SUT VIC AB 3-0 SH 8-18 (SUTURE) ×12 IMPLANT
TOWEL GREEN STERILE (TOWEL DISPOSABLE) ×7 IMPLANT
TOWEL GREEN STERILE FF (TOWEL DISPOSABLE) ×7 IMPLANT
TRAY FOLEY MTR SLVR 16FR STAT (SET/KITS/TRAYS/PACK) ×4 IMPLANT
WATER STERILE IRR 1000ML POUR (IV SOLUTION) ×7 IMPLANT

## 2021-09-14 NOTE — H&P (Signed)
Subjective: ?Patient is a 71 y.o. male admitted for PLIF L2-3. Onset of symptoms was several months ago, gradually worsening since that time.  The pain is rated severe, and is located at the across the lower back and radiates to legs. The pain is described as aching and occurs all day. The symptoms have been progressive. Symptoms are exacerbated by exercise, standing, and walking for more than a few minutes. MRI or CT showed adjacent ,level stenosis. Also has SCS with battery that is at the ned of life.  ? ?Past Medical History:  ?Diagnosis Date  ? Allergy   ? Arthritis   ? mainly in back  ? Back pain   ? Balance disorder   ? uses cane daily  ? Blood in urine   ? Chest pain   ? Constipation   ? Diverticulosis 02/14/2018  ? noted on colonoscopy  ? Environmental allergies   ? Foot swelling   ? Bilateral  ? GERD (gastroesophageal reflux disease)   ? Headache(784.0)   ? occas.  migraines  (from last surgery)  ? Heart murmur   ? as child....Marland Kitchenno problems as an adult  ? History of colon polyps 02/14/2018  ? Hyperlipidemia   ? Hypertension   ? dx  10 yrs or more  ago  ? Insomnia   ? Neck pain   ? Neuromuscular disorder ([place])   ? bilateral feet  ? Night sweats   ? Painful swelling of joint   ? PONV (postoperative nausea and vomiting)   ? Poor circulation of extremity   ? legs  ? Shortness of breath   ? with exertion  ? Sinus disorder   ? Spinal headache   ? Vertigo   ?  ?Past Surgical History:  ?Procedure Laterality Date  ? ABDOMINAL EXPOSURE N/A 09/14/2019  ? Procedure: ABDOMINAL EXPOSURE;  Surgeon: [name], MD;  Location: [place];  Service: Vascular;  Laterality: N/A;  ? ANTERIOR LAT LUMBAR FUSION Right 09/14/2019  ? Procedure: Right Lumbar three-four Lumbar four-five  Anterolateral lumbar interbody fusion;  Surgeon: Erline Levine, MD;  Location: [place];  Service: Neurosurgery;  Laterality: Right;  ? ANTERIOR LUMBAR FUSION N/A 09/14/2019  ? Procedure: Lumbar five Sacral one Anterior lumbar interbody fusion;   Surgeon: Erline Levine, MD;  Location: [place];  Service: Neurosurgery;  Laterality: N/A;  ? BACK SURGERY    ? CARDIAC CATHETERIZATION    ? 03/04/14  ? COLONOSCOPY    ? FRACTURE SURGERY    ? left arm  ? KNEE SURGERY Right 01/02/2018  ? torn minicus  ? LEFT HEART CATHETERIZATION WITH CORONARY ANGIOGRAM N/A 03/04/2014  ? Procedure: LEFT HEART CATHETERIZATION WITH CORONARY ANGIOGRAM;  Surgeon: Peter M Martinique, MD;  Location: [facility] C[facility];  Service: Cardiovascular;  Laterality: N/A;  ? LUMBAR LAMINECTOMY/DECOMPRESSION MICRODISCECTOMY  03/15/2012  ? Procedure: LUMBAR LAMINECTOMY/DECOMPRESSION MICRODISCECTOMY 2 LEVELS;  Surgeon: Floyce Stakes, MD;  Location: [place] [facility];  Service: Neurosurgery;  Laterality: Bilateral;  Bilateral Lumbar three, lumbar four, lumbar five Laminectomy  ? LUMBAR PERCUTANEOUS PEDICLE SCREW 3 LEVEL N/A 09/14/2019  ? Procedure: Lumbar three to Sacral one posterior pedicle screw fixation;  Surgeon: Erline Levine, MD;  Location: [place];  Service: Neurosurgery;  Laterality: N/A;  ? POLYPECTOMY    ? SPINAL CORD STIMULATOR INSERTION N/A 05/29/2014  ? Procedure: LUMBAR SPINAL CORD STIMULATOR INSERTION;  Surgeon: Bonna Gains, MD;  Location: [place] [facility];  Service: Neurosurgery;  Laterality: N/A;  ? TOTAL KNEE ARTHROPLASTY Right 02/08/2019  ?  Procedure: TOTAL KNEE ARTHROPLASTY;  Surgeon: Paralee Cancel, MD;  Location: [facility];  Service: Orthopedics;  Laterality: Right;  70 mins  ?  ?Prior to Admission medications   ?Medication Sig Start Date End Date Taking? Authorizing Provider  ?acetaminophen (TYLENOL) 500 MG tablet Take 2 tablets (1,000 mg total) by mouth every 8 (eight) hours. 02/09/19  Yes Danae Orleans, PA-C  ?aspirin 81 MG chewable tablet Chew 81 mg by mouth at bedtime.   Yes [provider]  ?cetirizine (ZYRTEC) 10 MG tablet Take 10 mg by mouth daily.    Yes [provider]  ?docusate sodium (COLACE) 100 MG capsule Take 1 capsule (100 mg total) by mouth 2 (two) times daily. ?Patient  taking differently: Take 100 mg by mouth daily. 02/09/19  Yes Babish, Rodman Key, PA-C  ?famotidine (PEPCID) 10 MG tablet Take 10 mg by mouth daily.   Yes [provider]  ?hydrOXYzine (ATARAX) 25 MG tablet Take 25 mg by mouth at bedtime.   Yes [provider]  ?lisinopril-hydrochlorothiazide (ZESTORETIC) 10-12.5 MG tablet Take 1 tablet by mouth daily. 01/27/21  Yes Minna Merritts, MD  ?methocarbamol (ROBAXIN) 750 MG tablet Take 750 mg by mouth 3 (three) times daily. 11/20/20  Yes [provider]  ?morphine (MS CONTIN) 30 MG 12 hr tablet Take 30 mg by mouth every 12 (twelve) hours.   Yes [provider]  ?nystatin cream (MYCOSTATIN) Apply 1 application. topically daily as needed (yeast).   Yes [provider]  ?oxyCODONE (ROXICODONE) 15 MG immediate release tablet Take 7.5-15 mg by mouth every 4 (four) hours as needed for pain.   Yes [provider]  ?polyethylene glycol (MIRALAX / GLYCOLAX) 17 g packet Take 17 g by mouth 2 (two) times daily. 02/09/19  Yes Babish, Rodman Key, PA-C  ?pravastatin (PRAVACHOL) 80 MG tablet Take 1 tablet (80 mg total) by mouth daily. 01/27/21  Yes Minna Merritts, MD  ?pregabalin (LYRICA) 75 MG capsule TAKE ONE CAPSULE BY MOUTH THREE TIMES DAILY  ?Patient taking differently: Take 75-150 mg by mouth See admin instructions. Take 75 mg by mouth in the morning and afternoon and 150 mg at bedtime 12/13/18  Yes Gillis Santa, MD  ?testosterone cypionate (DEPOTESTOSTERONE CYPIONATE) 200 MG/ML injection Inject 200 mg into the muscle every 14 (fourteen) days. 08/21/19  Yes [provider]  ?Vitamin D, Ergocalciferol, (DRISDOL) 50000 UNITS CAPS capsule Take 50,000 Units by mouth 2 (two) times a week.    Yes [provider]  ?zolpidem (AMBIEN) 10 MG tablet Take 10 mg by mouth at bedtime.   Yes [provider]  ?nitroGLYCERIN (NITROSTAT) 0.4 MG SL tablet Place 1 tablet (0.4 mg total) under the tongue every 5 (five) minutes as  needed for chest pain. 01/27/21   Minna Merritts, MD  ?oxyCODONE 10 MG TABS Take 1 tablet (10 mg total) by mouth every 4 (four) hours as needed for severe pain ((score 4 to 6)). ?Patient not taking: Reported on 09/01/2021 09/23/19   Viona Gilmore D, NP  ?promethazine (PHENERGAN) 12.5 MG tablet Take 2 tablets (25 mg total) by mouth every 6 (six) hours as needed for nausea. ?Patient not taking: Reported on 09/01/2021 03/26/12   Newman Pies, MD  ?sildenafil (VIAGRA) 100 MG tablet Take 100 mg by mouth daily as needed for erectile dysfunction.    [provider]  ?sodium chloride (OCEAN) 0.65 % SOLN nasal spray Place 1 spray into both nostrils as needed for congestion.    [provider]  ?  traMADol (ULTRAM) 50 MG tablet Take 1-2 tablets (50-100 mg total) by mouth every 6 (six) hours as needed for moderate pain or severe pain. ?Patient not taking: Reported on 01/27/2021 02/09/19   Danae Orleans, PA-C  ? ?Allergies  ?Allergen Reactions  ? Gabapentin Swelling  ? Keppra [Levetiracetam] Nausea And Vomiting  ? Other Rash  ?  Plastic tape: Paper tape ONLY! ?  ? Valium [Diazepam] Other (See Comments)  ?  Significant depression; "a different person"  ?  ?Social History  ? ?Tobacco Use  ? Smoking status: Former  ?  Years: 45.00  ?  Types: Cigarettes  ?  Passive exposure: Past  ? Smokeless tobacco: Former  ?  Types: Chew  ?  Quit date: 2013  ? Tobacco comments:  ?  quit smoking cigarettes > 30 years ago  ?Substance Use Topics  ? Alcohol use: Yes  ?  Alcohol/week: 2.0 - 6.0 standard drinks  ?  Types: 2 - 3 Shots of liquor per week  ?  ?Family History  ?Problem Relation Age of Onset  ? Heart attack Mother   ? Hypertension Mother   ? Hyperlipidemia Mother   ? Diabetes Mother   ? Cancer Father   ? Heart attack Brother 6  ? Hyperlipidemia Brother   ? Hypertension Brother   ? Heart disease Brother   ? Breast cancer Neg Hx   ? Colon polyps Neg Hx   ? Colon cancer Neg Hx   ? Esophageal cancer Neg Hx   ? Liver cancer  Neg Hx   ? Ovarian cancer Neg Hx   ? Pancreatic cancer Neg Hx   ? Prostate cancer Neg Hx   ? Rectal cancer Neg Hx   ? Stomach cancer Neg Hx   ? ?  ?Review of Systems ? ?Positive ROS: neg ? ?All other systems

## 2021-09-14 NOTE — Anesthesia Procedure Notes (Signed)
Procedure Name: Intubation ?Date/Time: 09/14/2021 11:47 AM ?Performed by: Gwyndolyn Saxon, CRNA ?Pre-anesthesia Checklist: Patient identified, Emergency Drugs available, Suction available and Patient being monitored ?Patient Re-evaluated:Patient Re-evaluated prior to induction ?Oxygen Delivery Method: Circle system utilized ?Preoxygenation: Pre-oxygenation with 100% oxygen ?Induction Type: IV induction ?Ventilation: Mask ventilation without difficulty and Oral airway inserted - appropriate to patient size ?Laryngoscope Size: Mac and 4 ?Grade View: Grade II ?Tube type: Oral ?Tube size: 7.5 mm ?Number of attempts: 1 ?Airway Equipment and Method: Stylet ?Placement Confirmation: ETT inserted through vocal cords under direct vision, positive ETCO2 and breath sounds checked- equal and bilateral ?Secured at: 23 cm ?Tube secured with: Tape ?Dental Injury: Teeth and Oropharynx as per pre-operative assessment  ?Comments: Pt states "I have chips in my front two teeth that are getting fixed soon." Teeth intact after intubation. ? ? ? ? ?

## 2021-09-14 NOTE — Op Note (Signed)
09/14/2021 ? ?4:15 PM ? ?PATIENT:  Darin Gomez  71 y.o. male ? ?PRE-OPERATIVE DIAGNOSIS: Adjacent level stenosis L2-3 with back pain and radiculopathy, spinal cord stimulator battery end-of-life ? ?POST-OPERATIVE DIAGNOSIS:  same ? ?PROCEDURE:   ?1. Decompressive lumbar laminectomy, hemi facetectomy and foraminotomies L2-3 requiring more work than would be required for a simple exposure of the disk for PLIF in order to adequately decompress the neural elements and address the spinal stenosis ?2. Posterior lumbar interbody fusion L2-3 using peek interbody cages packed with morcellized allograft and autograft  ?3. Posterior fixation L2-3 using NuVasive pedicle screws.  ?4. Intertransverse arthrodesis L2-3 using morcellized autograft and allograft. ?5.  Exploration of fusion L3-4 with removal of nonsegmental instrumentation L3-4 ?6.  Removal of spinal cord stimulator leads, connectors and pulse generator through a separate incision ? ?SURGEON:  Sherley Bounds, MD ? ?ASSISTANTS: Glenford Peers FNP ? ?ANESTHESIA:  General ? ?EBL: 750 ml ? ?Total I/O ?In: 2250 [I.V.:1800; IV Piggyback:450] ?Out: 1160 [Urine:410; Blood:750] ? ?BLOOD ADMINISTERED:none ? ?DRAINS: none  ? ?INDICATION FOR PROCEDURE: This patient presented with severe back pain with leg pain. Imaging revealed severe adjacent level disease with spinal stenosis L2-3 above previous L3-4 L4-5 L5-S1 fusion done by another surgeon . he also had occultly charging his spinal cord stimulator pulse generator which had been there for 8 years. The patient tried a reasonable attempt at conservative medical measures without relief. I recommended decompression and instrumented fusion to address the stenosis as well as the segmental  instability.  Patient understood the risks, benefits, and alternatives and potential outcomes and wished to proceed. ? ?Findings at operation: ? ?We had a long discussion before the surgery about his spinal cord stimulator.  The leads were tied  down to the fascia in the midline over the L3-4 interspace.  The leads then ran by the L2 spinous process and entered the epidural space at L1-2.  We felt that it was very unlikely that we would be able to do the exposure and the muscle retraction without pulling on the leads and removing them from the epidural space.  This indeed turned out to be true.  There was no way to do the operation at L2-3 without removing the leads.  Therefore the spinal cord stimulator leads and battery were removed intraoperatively.  We plan to hopefully do an MRI of the thoracic spine for surgical planning followed by replacement of the spinal cord stimulator with a paddle lead in the near future. ? ?PROCEDURE DETAILS:  ?The patient was brought to the operating room. After induction of generalized endotracheal anesthesia the patient was rolled into the prone position on chest rolls and all pressure points were padded. The patient's lumbar region was cleaned and then prepped with DuraPrep and draped in the usual sterile fashion. Anesthesia was injected and then a dorsal midline incision was made and carried down to the lumbosacral fascia. The fascia was opened and the paraspinous musculature was taken down in a subperiosteal fashion to expose L2-3 and the previously placed instrumentation at L3-4.  Became very evident early that the wires were running along the spinous process of L2 and then laterally into the musculature and any retraction into the musculature pulled on the leads.  The leads were pulled back by at least a centimeter and we decided that it was best to simply remove the spinal cord stimulator altogether.  We therefore pulled the leads out the epidural space and dissected defining the connectors which were cut and  removed.  The incision over the battery was opened and the battery was identified and removed.  Along with the leads.  We then packed this incision with a saline soaked sponge.  Finished our exposure of L2-3 and  the previously placed instrumentation.  A self-retaining retractor was placed. Intraoperative fluoroscopy confirmed my level, and I started with removal of the old instrumentation.  We used a metal cutting bur to cut the rod between L3 and L4.  We remove the locking caps and the rod.  The screws at L3 had excellent purchase. ? ? I then turned my attention to the decompression and complete lumbar laminectomies, hemi- facetectomies, and foraminotomies were performed at L2-3.  My nurse practitioner was directly involved in the decompression and exposure of the neural elements. the patient had significant spinal stenosis and this required more work than would be required for a simple exposure of the disc for posterior lumbar interbody fusion which would only require a limited laminotomy. Much more generous decompression and generous foraminotomy was undertaken in order to adequately decompress the neural elements and address the patient's leg pain. The yellow ligament was removed to expose the underlying dura and nerve roots, and generous foraminotomies were performed to adequately decompress the neural elements. Both the exiting and traversing nerve roots were decompressed on both sides until a coronary dilator passed easily along the nerve roots. Once the decompression was complete, I turned my attention to the posterior lower lumbar interbody fusion. The epidural venous vasculature was coagulated and cut sharply. Disc space was incised and the initial discectomy was performed with pituitary rongeurs. The disc space was distracted with sequential distractors to a height of 11 mm. We then used a series of scrapers and shavers to prepare the endplates for fusion. The midline was prepared with Epstein curettes. Once the complete discectomy was finished, we packed an appropriate sized interbody cage with local autograft and morcellized allograft, gently retracted the nerve root, and tapped the cage into position at L2-3.   The midline between the cages was packed with morselized autograft and allograft.  ? ?We then turned our attention to the placement of the L2 pedicle screws. The pedicle screw entry zones were identified utilizing surface landmarks and fluoroscopy. I drilled into each pedicle utilizing the hand drill, open each pedicle with the pedicle probe and tapped each pedicle with the appropriate 5.5 tap. We palpated with a ball probe to assure no break in the cortex. We then placed 6.5 x 50 mm pedicle screws into the pedicles bilaterally at L2.  My nurse practitioner assisted in placement of the pedicle screws.  We then decorticated the transverse processes and laid a mixture of morcellized autograft and allograft out over these to perform intertransverse arthrodesis at L2-3. We then placed lordotic rods into the multiaxial screw heads of the pedicle screws and locked these in position with the locking caps and anti-torque device. We then checked our construct with AP and lateral fluoroscopy. Irrigated with copious amounts of bacitracin-containing saline solution. Inspected the nerve roots once again to assure adequate decompression, lined to the dura with Gelfoam, a medium Hemovac drain through a separate stab incision on each side of the spinous processes, and then we closed the muscle and the fascia with 0 Vicryl. Closed the subcutaneous tissues of both incisions with 2-0 Vicryl and subcuticular tissues with 3-0 Vicryl. The skin was closed with Dermabond, benzoin and Steri-Strips. Dressing was then applied, the patient was awakened from general anesthesia and transported to  the recovery room in stable condition. At the end of the procedure all sponge, needle and instrument counts were correct.  ? ?PLAN OF CARE: admit to inpatient ? ?PATIENT DISPOSITION:  PACU - hemodynamically stable. ?  ?Delay start of Pharmacological VTE agent (>24hrs) due to surgical blood loss or risk of bleeding:  yes ? ? ?

## 2021-09-14 NOTE — Transfer of Care (Signed)
Immediate Anesthesia Transfer of Care Note ? ?Patient: Darin Gomez ? ?Procedure(s) Performed: LUMBAR TWO-THREE POSTERIOR LUMBAR INTERBODY FUSION WITH EXTENSION OF INSTRUMENTATION (Spine Lumbar) ?Removal of spinal cord stimulator leads (Back) ? ?Patient Location: PACU ? ?Anesthesia Type:General ? ?Level of Consciousness: awake, pateint uncooperative and confused ? ?Airway & Oxygen Therapy: Patient Spontanous Breathing and Patient connected to face mask oxygen ? ?Post-op Assessment: Report given to RN, Post -op Vital signs reviewed and stable, Patient moving all extremities X 4 and Patient able to stick tongue midline ? ?Post vital signs: Reviewed ? ?Last Vitals:  ?Vitals Value Taken Time  ?BP 194/85 09/14/21 1622  ?Temp 97.4   ?Pulse 99 09/14/21 1623  ?Resp 12 09/14/21 1623  ?SpO2 92 % 09/14/21 1623  ?Vitals shown include unvalidated device data. ? ?Last Pain:  ?Vitals:  ? 09/14/21 0844  ?TempSrc:   ?PainSc: 5   ?   ? ?  ? ?Complications: No notable events documented. ?

## 2021-09-15 ENCOUNTER — Encounter (HOSPITAL_COMMUNITY): Payer: Self-pay | Admitting: Neurological Surgery

## 2021-09-15 MED ORDER — MORPHINE SULFATE ER 15 MG PO TBCR
30.0000 mg | EXTENDED_RELEASE_TABLET | Freq: Two times a day (BID) | ORAL | Status: DC
  Administered 2021-09-15 – 2021-09-16 (×3): 30 mg via ORAL
  Filled 2021-09-15 (×3): qty 2

## 2021-09-15 MED ORDER — PANTOPRAZOLE SODIUM 40 MG PO TBEC
40.0000 mg | DELAYED_RELEASE_TABLET | Freq: Every day | ORAL | Status: DC
  Administered 2021-09-15: 40 mg via ORAL
  Filled 2021-09-15: qty 1

## 2021-09-15 MED ORDER — HYDROMORPHONE HCL 1 MG/ML IJ SOLN
0.5000 mg | INTRAMUSCULAR | Status: DC | PRN
  Administered 2021-09-15 (×2): 0.5 mg via INTRAVENOUS
  Filled 2021-09-15 (×2): qty 0.5

## 2021-09-15 MED ORDER — CALCIUM CARBONATE ANTACID 500 MG PO CHEW
2.0000 | CHEWABLE_TABLET | Freq: Four times a day (QID) | ORAL | Status: DC | PRN
  Administered 2021-09-15 (×2): 400 mg via ORAL
  Filled 2021-09-15 (×3): qty 2

## 2021-09-15 MED ORDER — KETOROLAC TROMETHAMINE 30 MG/ML IJ SOLN
30.0000 mg | Freq: Four times a day (QID) | INTRAMUSCULAR | Status: AC
  Administered 2021-09-15 – 2021-09-16 (×3): 30 mg via INTRAVENOUS
  Filled 2021-09-15 (×3): qty 1

## 2021-09-15 MED FILL — Thrombin For Soln 5000 Unit: CUTANEOUS | Qty: 5000 | Status: AC

## 2021-09-15 NOTE — Progress Notes (Signed)
Subjective: ?Patient reports back pain as expected ? ?Objective: ?Vital signs in last 24 hours: ?Temp:  [97.7 ?F (36.5 ?C)-98.2 ?F (36.8 ?C)] 98.2 ?F (36.8 ?C) (04/18 0729) ?Pulse Rate:  [61-97] 68 (04/18 0729) ?Resp:  [11-20] 18 (04/18 0729) ?BP: (113-194)/(48-94) 141/69 (04/18 0729) ?SpO2:  [90 %-97 %] 94 % (04/18 0729) ?Weight:  [133.4 kg] 133.4 kg (04/17 0829) ? ?Intake/Output from previous day: ?04/17 0701 - 04/18 0700 ?In: 7078 [I.V.:1800; IV Piggyback:650] ?Out: 1910 [Urine:560; Drains:600; Blood:750] ?Intake/Output this shift: ?No intake/output data recorded. ? ?Neurologic: Grossly normal ? ?Lab Results: ?Lab Results  ?Component Value Date  ? WBC 7.1 09/04/2021  ? HGB 18.4 (H) 09/04/2021  ? HCT 53.2 (H) 09/04/2021  ? MCV 93.8 09/04/2021  ? PLT 239 09/04/2021  ? ?Lab Results  ?Component Value Date  ? INR 1.0 09/04/2021  ? ?BMET ?Lab Results  ?Component Value Date  ? NA 137 09/04/2021  ? K 4.3 09/04/2021  ? CL 103 09/04/2021  ? CO2 28 09/04/2021  ? GLUCOSE 97 09/04/2021  ? BUN 15 09/04/2021  ? CREATININE 1.04 09/04/2021  ? CALCIUM 8.9 09/04/2021  ? ? ?Studies/Results: ?DG Lumbar Spine 2-3 Views ? ?Result Date: 09/14/2021 ?CLINICAL DATA:  LUMBAR TWO-THREE POSTERIOR LUMBAR INTERBODY FUSION WITH EXTENSION OF INSTRUMENTATION. EXAM: LUMBAR SPINE - 2-3 VIEW COMPARISON:  None. FINDINGS: Two intraoperative fluoroscopic images are provided. Posterior inter pedicular fixation hardware is shown within the lumbar spine. Hardware appears intact and appropriately positioned with intervening disc grafts/spacers. Fluoroscopy provided for 43 seconds.  40.55 mGy IMPRESSION: Intraoperative fluoroscopic images demonstrating posterior interpedicular fixation hardware within the lumbar spine. No evidence of surgical complicating feature. Electronically Signed   By: Franki Cabot M.D.   On: 09/14/2021 15:39  ? ?DG C-Arm 1-60 Min-No Report ? ?Result Date: 09/14/2021 ?Fluoroscopy was utilized by the requesting physician.  No  radiographic interpretation.  ? ?DG C-Arm 1-60 Min-No Report ? ?Result Date: 09/14/2021 ?Fluoroscopy was utilized by the requesting physician.  No radiographic interpretation.  ? ?DG C-Arm 1-60 Min-No Report ? ?Result Date: 09/14/2021 ?Fluoroscopy was utilized by the requesting physician.  No radiographic interpretation.  ? ?DG C-Arm 1-60 Min-No Report ? ?Result Date: 09/14/2021 ?Fluoroscopy was utilized by the requesting physician.  No radiographic interpretation.   ? ?Assessment/Plan: ?Postop day 1 lumbar fusion. Doing well. Will continue to ambulate and work with therapy today. If he feels up to going home later this afternoon I thin that is reasonable. Will adjust his pain medications as well ? ? LOS: 1 day  ? ? ?Darin Gomez ?09/15/2021, 8:06 AM ? ? ?  ?

## 2021-09-15 NOTE — Anesthesia Postprocedure Evaluation (Signed)
Anesthesia Post Note ? ?Patient: Darin Gomez ? ?Procedure(s) Performed: LUMBAR TWO-THREE POSTERIOR LUMBAR INTERBODY FUSION WITH EXTENSION OF INSTRUMENTATION (Spine Lumbar) ?Removal of spinal cord stimulator leads (Back) ? ?  ? ?Patient location during evaluation: PACU ?Anesthesia Type: General ?Level of consciousness: awake and alert ?Pain management: pain level controlled ?Vital Signs Assessment: post-procedure vital signs reviewed and stable ?Respiratory status: spontaneous breathing, nonlabored ventilation, respiratory function stable and patient connected to nasal cannula oxygen ?Cardiovascular status: blood pressure returned to baseline and stable ?Postop Assessment: no apparent nausea or vomiting ?Anesthetic complications: no ? ? ?No notable events documented. ? ?Last Vitals:  ?Vitals:  ? 09/15/21 0627 09/15/21 0729  ?BP: (!) 147/66 (!) 141/69  ?Pulse: 61 68  ?Resp: 20 18  ?Temp: 36.5 ?C 36.8 ?C  ?SpO2: 97% 94%  ?  ?Last Pain:  ?Vitals:  ? 09/15/21 0729  ?TempSrc: Oral  ?PainSc:   ? ? ?  ?  ?  ?  ?  ?  ? ?Bent[place] S ? ? ? ? ?

## 2021-09-15 NOTE — Evaluation (Signed)
Physical Therapy Evaluation and Discharge ?Patient Details ?Name: Darin Gomez ?MRN: [id number] ?DOB: [birth year] ?Today's Date: 09/15/2021 ? ?History of Present Illness ? Darin Gomez is a 60 yop male who underwent PLIF L2-3 and removal of spinal cord stimulator 4/17. PMHx: athritis, HLD, HTN, vertigo, & several spinal surgeries  ?Clinical Impression ? Patient evaluated by Physical Therapy with no further acute PT needs identified. Pt appears to be fairly close to his functional baseline. Pt reports continued BLE radicular pain and numbness. Pt ambulating 200 ft with a walker modI and able to manage brace independently. Has good recall of spinal precautions. Presents with BLE weakness and impaired standing balance. Reviewed standing exercises for BLE strengthening and walking program in addition to fall prevention strategies. All education has been completed and the patient has no further questions. No follow-up Physical Therapy or equipment needs. PT is signing off. Thank you for this referral. ? ?   ? ?Recommendations for follow up therapy are one component of a multi-disciplinary discharge planning process, led by the attending physician.  Recommendations may be updated based on patient status, additional functional criteria and insurance authorization. ? ?Follow Up Recommendations No PT follow up ? ?  ?Assistance Recommended at Discharge PRN  ?Patient can return home with the following ? Assistance with cooking/housework;Assist for transportation;Help with stairs or ramp for entrance ? ?  ?Equipment Recommendations None recommended by PT  ?Recommendations for Other Services ?    ?  ?Functional Status Assessment Patient has had a recent decline in their functional status and demonstrates the ability to make significant improvements in function in a reasonable and predictable amount of time.  ? ?  ?Precautions / Restrictions Precautions ?Precautions: Back;Fall ?Precaution Booklet Issued: Yes (comment) ?Precaution  Comments: verbally reviewed, provided written handout ?Restrictions ?Weight Bearing Restrictions: No  ? ?  ? ?Mobility ? Bed Mobility ?  ?  ?  ?  ?  ?  ?  ?General bed mobility comments: OOB in chair ?  ? ?Transfers ?Overall transfer level: Modified independent ?Equipment used: Rolling walker (2 wheels) ?  ?  ?  ?  ?  ?  ?  ?  ?  ? ?Ambulation/Gait ?Ambulation/Gait assistance: Modified independent (Device/Increase time) ?Gait Distance (Feet): 200 Feet ?Assistive device: Rolling walker (2 wheels) ?Gait Pattern/deviations: Step-through pattern, Decreased stride length ?Gait velocity: decreased ?  ?  ?General Gait Details: Cues for glute activation and scapular depression, slower, but steady pace ? ?Stairs ?  ?  ?  ?  ?  ? ?Wheelchair Mobility ?  ? ?Modified Rankin (Stroke Patients Only) ?  ? ?  ? ?Balance Overall balance assessment: Mild deficits observed, not formally tested ?  ?  ?  ?  ?  ?  ?  ?  ?  ?  ?  ?  ?  ?  ?  ?  ?  ?  ?   ? ? ? ?Pertinent Vitals/Pain Pain Assessment ?Pain Assessment: Faces ?Faces Pain Scale: Hurts little more ?Pain Location: back ?Pain Descriptors / Indicators: Discomfort, Grimacing ?Pain Intervention(s): Monitored during session  ? ? ?Home Living Family/patient expects to be discharged to:: Private residence ?Living Arrangements: Spouse/significant other ?Available Help at Discharge: Family;Available 24 hours/day ?Type of Home: House ?Home Access: Ramped entrance ?  ?  ?  ?Home Layout: One level ?Home Equipment: Conservation officer, nature (2 wheels);Cane - single point;Grab bars - tub/shower;Grab bars - toilet ?   ?  ?Prior Function Prior Level of Function : Independent/Modified Independent;Driving ?  ?  ?  ?  ?  ?  ?  Mobility Comments: SPC for mobility, RW intermittently ?ADLs Comments: indep ?  ? ? ?Hand Dominance  ? Dominant Hand: Right ? ?  ?Extremity/Trunk Assessment  ? Upper Extremity Assessment ?Upper Extremity Assessment: Overall WFL for tasks assessed ?  ? ?Lower Extremity Assessment ?Lower  Extremity Assessment: RLE deficits/detail;LLE deficits/detail ?RLE Deficits / Details: Hip flexion 2/5, knee extension 5/5, ankle dorsiflexion 5/5. Reports diffuse numbness (chronic) ?LLE Deficits / Details: Hip flexion 2/5, knee extension 5/5, ankle dorsiflexion 5/5. Reports diffuse numbness (chronic) ?  ? ?Cervical / Trunk Assessment ?Cervical / Trunk Assessment: Back Surgery  ?Communication  ? Communication: No difficulties  ?Cognition Arousal/Alertness: Awake/alert ?Behavior During Therapy: [facility] for tasks assessed/performed ?Overall Cognitive Status: Within Functional Limits for tasks assessed ?  ?  ?  ?  ?  ?  ?  ?  ?  ?  ?  ?  ?  ?  ?  ?  ?General Comments: great understanding of back precautions, brace wear/care and recovery process of back sx ?  ?  ? ?  ?General Comments General comments (skin integrity, edema, etc.): VSS on RA, wife present and supportive ? ?  ?Exercises Other Exercises ?Other Exercises: Standing with BUE support: Mini squats x 5, bilateral hip abduction x 5  ? ?Assessment/Plan  ?  ?PT Assessment Patient does not need any further PT services  ?PT Problem List   ? ?   ?  ?PT Treatment Interventions     ? ?PT Goals (Current goals can be found in the Care Plan section)  ?Acute Rehab PT Goals ?Patient Stated Goal: less pain ?PT Goal Formulation: All assessment and education complete, DC therapy ? ?  ?Frequency   ?  ? ? ?Co-evaluation   ?  ?  ?  ?  ? ? ?  ?AM-PAC PT "6 Clicks" Mobility  ?Outcome Measure Help needed turning from your back to your side while in a flat bed without using bedrails?: None ?Help needed moving from lying on your back to sitting on the side of a flat bed without using bedrails?: None ?Help needed moving to and from a bed to a chair (including a wheelchair)?: None ?Help needed standing up from a chair using your arms (e.g., wheelchair or bedside chair)?: None ?Help needed to walk in hospital room?: None ?Help needed climbing 3-5 steps with a railing? : A Little ?6 Click  Score: 23 ? ?  ?End of Session Equipment Utilized During Treatment: Back brace ?Activity Tolerance: Patient tolerated treatment well ?Patient left: in chair;with call bell/phone within reach ?Nurse Communication: Mobility status ?PT Visit Diagnosis: Pain;Unsteadiness on feet (R26.81) ?Pain - part of body:  (back) ?  ? ?Time: 8341-9622 ?PT Time Calculation (min) (ACUTE ONLY): 18 min ? ? ?Charges:   PT Evaluation ?$PT Eval Low Complexity: 1 Low ?  ?  ?   ? ? ?Wyona Almas, PT, DPT ?Acute Rehabilitation Services ?Pager [phone] ?Office [phone] ? ? ?Carloine Margo Aye ?09/15/2021, 12:24 PM ?

## 2021-09-15 NOTE — Progress Notes (Signed)
Subjective: ?Patient reports back pain, "wobbly" legs, no NTW, walking well ? ?Objective: ?Vital signs in last 24 hours: ?Temp:  [97.7 ?F (36.5 ?C)-98.2 ?F (36.8 ?C)] 98.2 ?F (36.8 ?C) (04/18 0729) ?Pulse Rate:  [61-97] 68 (04/18 0729) ?Resp:  [11-20] 18 (04/18 0729) ?BP: (113-194)/(48-94) 141/69 (04/18 0729) ?SpO2:  [90 %-97 %] 94 % (04/18 0729) ?Weight:  [133.4 kg] 133.4 kg (04/17 0829) ? ?Intake/Output from previous day: ?04/17 0701 - 04/18 0700 ?In: 1007 [I.V.:1800; IV Piggyback:650] ?Out: 1910 [Urine:560; Drains:600; Blood:750] ?Intake/Output this shift: ?No intake/output data recorded. ? ?Neurologic: Grossly normal, good leg strength, walked with walker well ? ?Lab Results: ?Lab Results  ?Component Value Date  ? WBC 7.1 09/04/2021  ? HGB 18.4 (H) 09/04/2021  ? HCT 53.2 (H) 09/04/2021  ? MCV 93.8 09/04/2021  ? PLT 239 09/04/2021  ? ?Lab Results  ?Component Value Date  ? INR 1.0 09/04/2021  ? ?BMET ?Lab Results  ?Component Value Date  ? NA 137 09/04/2021  ? K 4.3 09/04/2021  ? CL 103 09/04/2021  ? CO2 28 09/04/2021  ? GLUCOSE 97 09/04/2021  ? BUN 15 09/04/2021  ? CREATININE 1.04 09/04/2021  ? CALCIUM 8.9 09/04/2021  ? ? ?Studies/Results: ?DG Lumbar Spine 2-3 Views ? ?Result Date: 09/14/2021 ?CLINICAL DATA:  LUMBAR TWO-THREE POSTERIOR LUMBAR INTERBODY FUSION WITH EXTENSION OF INSTRUMENTATION. EXAM: LUMBAR SPINE - 2-3 VIEW COMPARISON:  None. FINDINGS: Two intraoperative fluoroscopic images are provided. Posterior inter pedicular fixation hardware is shown within the lumbar spine. Hardware appears intact and appropriately positioned with intervening disc grafts/spacers. Fluoroscopy provided for 43 seconds.  40.55 mGy IMPRESSION: Intraoperative fluoroscopic images demonstrating posterior interpedicular fixation hardware within the lumbar spine. No evidence of surgical complicating feature. Electronically Signed   By: Franki Cabot M.D.   On: 09/14/2021 15:39  ? ?DG C-Arm 1-60 Min-No Report ? ?Result Date:  09/14/2021 ?Fluoroscopy was utilized by the requesting physician.  No radiographic interpretation.  ? ?DG C-Arm 1-60 Min-No Report ? ?Result Date: 09/14/2021 ?Fluoroscopy was utilized by the requesting physician.  No radiographic interpretation.  ? ?DG C-Arm 1-60 Min-No Report ? ?Result Date: 09/14/2021 ?Fluoroscopy was utilized by the requesting physician.  No radiographic interpretation.  ? ?DG C-Arm 1-60 Min-No Report ? ?Result Date: 09/14/2021 ?Fluoroscopy was utilized by the requesting physician.  No radiographic interpretation.   ? ?Assessment/Plan: ?Doing well, pain control PT/OT, maybe home today vs tomorrow depending on pain control and drain output ? ?Estimated body mass index is 38.79 kg/m? as calculated from the following: ?  Height as of this encounter: '6\' 1"'$  (1.854 m). ?  Weight as of this encounter: 133.4 kg. ? ? ? LOS: 1 day  ? ? ?Darin Gomez ?09/15/2021, 8:08 AM ? ? ? ? ?

## 2021-09-15 NOTE — Plan of Care (Signed)

## 2021-09-15 NOTE — Evaluation (Signed)
Occupational Therapy Evaluation ?Patient Details ?Name: Darin Gomez ?MRN: [id number] ?DOB: 1951-04-05 ?Today's Date: 09/15/2021 ? ? ?History of Present Illness Darin Gomez is a 76 yop male who underwent PLIF L2-3 and removal of spinal cord stimulator 4/17. PMHx: athritis, HLD, HTN, vertigo, & several spinal surgeries  ? ?Clinical Impression ?  ?Darin Gomez was evaluated s/p the above back sx, he is generally mod I at baseline with use of SPC vs RW. He lives in a 1 level home with ramped entrance and his wife who can assist as needed at d/c. Upon evaluation pt verbalized great knowledge of his back precautions and compensatory techniques to utilize that he has learned from prior back surgeries. Overall he was set up for sitting ADLs and up to min G fro LB ADLs and functional mobility with RW. He does not need further acute OT or post-acute OT. Recommend d/c to home with support of his wife.  ?   ? ?Recommendations for follow up therapy are one component of a multi-disciplinary discharge planning process, led by the attending physician.  Recommendations may be updated based on patient status, additional functional criteria and insurance authorization.  ? ?Follow Up Recommendations ? No OT follow up  ?  ?Assistance Recommended at Discharge Intermittent Supervision/Assistance  ?Patient can return home with the following A little help with walking and/or transfers;A little help with bathing/dressing/bathroom;Help with stairs or ramp for entrance;Assist for transportation ? ?  ?Functional Status Assessment ? Patient has had a recent decline in their functional status and demonstrates the ability to make significant improvements in function in a reasonable and predictable amount of time.  ?Equipment Recommendations ? Tub/shower seat  ?  ?   ?Precautions / Restrictions Precautions ?Precautions: Back;Fall ?Restrictions ?Weight Bearing Restrictions: No  ? ?  ? ?Mobility Bed Mobility ?Overal bed mobility: Needs Assistance ?Bed Mobility:  Rolling, Sidelying to Sit ?Rolling: Supervision ?Sidelying to sit: Supervision ?  ?  ?  ?  ?  ? ?Transfers ?Overall transfer level: Needs assistance ?Equipment used: Rolling walker (2 wheels) ?Transfers: Sit to/from Stand ?Sit to Stand: Min guard ?  ?  ?  ?  ?  ?  ?  ? ?  ?Balance Overall balance assessment: No apparent balance deficits (not formally assessed) ?  ?   ? ?ADL either performed or assessed with clinical judgement  ? ?ADL Overall ADL's : Needs assistance/impaired ?  ?  ?  ?  ?  ?  ?  ?  ?Functional mobility during ADLs: Rolling walker (2 wheels);Supervision/safety ?General ADL Comments: Pt has great understanding of back precautions and all compensatory techniques to utilize to maintain precautions. Overall he is set up for all sitting UB tasks and supervision-min G for LB tasks. Pt's wife demonstrated great ability to assist pt as needed for ADLs. She completed fucntional mobility with RW and supervision A adn increased time for pain management.  ? ? ? ?Vision Baseline Vision/History: 0 No visual deficits ?Patient Visual Report: No change from baseline ?Vision Assessment?: No apparent visual deficits  ?   ?   ?   ? ?Pertinent Vitals/Pain Pain Assessment ?Pain Assessment: Faces ?Faces Pain Scale: Hurts little more ?Pain Location: back ?Pain Descriptors / Indicators: Discomfort, Grimacing ?Pain Intervention(s): Limited activity within patient's tolerance, Monitored during session  ? ? ? ?Hand Dominance Right ?  ?Extremity/Trunk Assessment Upper Extremity Assessment ?Upper Extremity Assessment: Overall WFL for tasks assessed ?  ?Lower Extremity Assessment ?Lower Extremity Assessment: Generalized weakness ?  ?Cervical / Trunk Assessment ?  Cervical / Trunk Assessment: Back Surgery ?  ?Communication Communication ?Communication: No difficulties ?  ?Cognition Arousal/Alertness: Awake/alert ?Behavior During Therapy: [facility] for tasks assessed/performed ?Overall Cognitive Status: Within Functional Limits for tasks  assessed ?  ?  ?  ?  ?  ?  ?General Comments: great understanding of back precautinos, brace wear/care and recovery process of back sx ?  ?  ?General Comments  VSS on RA, wife present and supportive ? ?  ?   ?   ? ? ?Home Living Family/patient expects to be discharged to:: Private residence ?Living Arrangements: Spouse/significant other ?Available Help at Discharge: Family;Available 24 hours/day ?Type of Home: House ?Home Access: Ramped entrance ?  ?  ?Home Layout: One level ?  ?  ?Bathroom Shower/Tub: Walk-in shower ?  ?Bathroom Toilet: Handicapped height ?Bathroom Accessibility: Yes ?  ?Home Equipment: Conservation officer, nature (2 wheels);Cane - single point;Grab bars - tub/shower;Grab bars - toilet ?  ?  ?  ? ?  ?Prior Functioning/Environment Prior Level of Function : Independent/Modified Independent;Driving ?  ?  ?  ?  ?  ?  ?Mobility Comments: SPC for mobility, RW intermittently ?ADLs Comments: indep ?  ? ?  ?  ?OT Problem List: Decreased strength;Decreased range of motion;Decreased activity tolerance;Impaired balance (sitting and/or standing);Decreased knowledge of precautions;Pain ?  ?   ?OT Treatment/Interventions:    ?  ?OT Goals(Current goals can be found in the care plan section) Acute Rehab OT Goals ?Patient Stated Goal: home tomorrow ?OT Goal Formulation: All assessment and education complete, DC therapy ?Time For Goal Achievement: 09/15/21 ?Potential to Achieve Goals: Good  ? ?AM-PAC OT "6 Clicks" Daily Activity     ?Outcome Measure Help from another person eating meals?: None ?Help from another person taking care of personal grooming?: A Little ?Help from another person toileting, which includes using toliet, bedpan, or urinal?: A Little ?Help from another person bathing (including washing, rinsing, drying)?: A Little ?Help from another person to put on and taking off regular upper body clothing?: A Little ?Help from another person to put on and taking off regular lower body clothing?: A Little ?6 Click Score:  19 ?  ?End of Session Equipment Utilized During Treatment: Rolling walker (2 wheels);Back brace ?Nurse Communication: Mobility status ? ?Activity Tolerance: Patient tolerated treatment well ?Patient left: in chair;with call bell/phone within reach;with family/visitor present ? ?OT Visit Diagnosis: Unsteadiness on feet (R26.81);Other abnormalities of gait and mobility (R26.89);Muscle weakness (generalized) (M62.81);Pain  ?              ?Time: 0962-8366 ?OT Time Calculation (min): 31 min ?Charges:  OT General Charges ?$OT Visit: 1 Visit ?OT Evaluation ?$OT Eval Low Complexity: 1 Low ?OT Treatments ?$Self Care/Home Management : 8-22 mins ? ? ?Paticia Moster A Airianna Kreischer ?09/15/2021, 11:08 AM ?

## 2021-09-16 MED ORDER — METHOCARBAMOL 750 MG PO TABS
750.0000 mg | ORAL_TABLET | Freq: Four times a day (QID) | ORAL | Status: DC | PRN
  Administered 2021-09-16: 750 mg via ORAL
  Filled 2021-09-16: qty 1

## 2021-09-16 NOTE — Discharge Summary (Signed)
Physician Discharge Summary  ?Patient ID: ?Darin Gomez ?MRN: [id number] ?DOB/AGE: [birth year] 71 y.o. ? ?Admit date: 09/14/2021 ?Discharge date: 09/16/2021 ? ?Admission Diagnoses: Adjacent level stenosis L2-3 with back pain and radiculopathy, spinal cord stimulator battery end-of-life  ? ? ?Discharge Diagnoses: same ? ? ?Discharged Condition: good ? ?Hospital Course: The patient was admitted on 09/14/2021 and taken to the operating room where the patient underwent PLIF L2-3. The patient tolerated the procedure well and was taken to the recovery room and then to the floor in stable condition. The hospital course was routine. There were no complications. The wound remained clean dry and intact. Pt had appropriate back soreness. No complaints of leg pain or new N/T/W. The patient remained afebrile with stable vital signs, and tolerated a regular diet. The patient continued to increase activities, and pain was well controlled with oral pain medications.  ? ?Consults: None ? ?Significant Diagnostic Studies:  ?Results for orders placed or performed during the hospital encounter of 09/04/21  ?Surgical PCR Screen  ? Specimen: Nasal Mucosa; Nasal Swab  ?Result Value Ref Range  ? MRSA, PCR NEGATIVE NEGATIVE  ? Staphylococcus aureus NEGATIVE NEGATIVE  ?Protime-INR  ?Result Value Ref Range  ? Prothrombin Time 13.1 11.4 - 15.2 seconds  ? INR 1.0 0.8 - 1.2  ?CBC  ?Result Value Ref Range  ? WBC 7.1 4.0 - 10.5 K/uL  ? RBC 5.67 4.22 - 5.81 MIL/uL  ? Hemoglobin 18.4 (H) 13.0 - 17.0 g/dL  ? HCT 53.2 (H) 39.0 - 52.0 %  ? MCV 93.8 80.0 - 100.0 fL  ? MCH 32.5 26.0 - 34.0 pg  ? MCHC 34.6 30.0 - 36.0 g/dL  ? RDW 13.2 11.5 - 15.5 %  ? Platelets 239 150 - 400 K/uL  ? nRBC 0.0 0.0 - 0.2 %  ?Basic Metabolic Panel  ?Result Value Ref Range  ? Sodium 137 135 - 145 mmol/L  ? Potassium 4.3 3.5 - 5.1 mmol/L  ? Chloride 103 98 - 111 mmol/L  ? CO2 28 22 - 32 mmol/L  ? Glucose, Bld 97 70 - 99 mg/dL  ? BUN 15 8 - 23 mg/dL  ? Creatinine, Ser 1.04 0.61 -  1.24 mg/dL  ? Calcium 8.9 8.9 - 10.3 mg/dL  ? GFR, Estimated >60 >60 mL/min  ? Anion gap 6 5 - 15  ?Type and screen [place]  ?Result Value Ref Range  ? ABO/RH(D) A POS   ? Antibody Screen NEG   ? Sample Expiration 09/18/2021,2359   ? Extend sample reason    ?  NO TRANSFUSIONS OR PREGNANCY IN THE PAST 3 MONTHS ?Performed at [facility], [place] [address], [place], [place] [address] ?  ? ? ?DG Lumbar Spine 2-3 Views ? ?Result Date: 09/14/2021 ?CLINICAL DATA:  LUMBAR TWO-THREE POSTERIOR LUMBAR INTERBODY FUSION WITH EXTENSION OF INSTRUMENTATION. EXAM: LUMBAR SPINE - 2-3 VIEW COMPARISON:  None. FINDINGS: Two intraoperative fluoroscopic images are provided. Posterior inter pedicular fixation hardware is shown within the lumbar spine. Hardware appears intact and appropriately positioned with intervening disc grafts/spacers. Fluoroscopy provided for 43 seconds.  40.55 mGy IMPRESSION: Intraoperative fluoroscopic images demonstrating posterior interpedicular fixation hardware within the lumbar spine. No evidence of surgical complicating feature. Electronically Signed   By: Franki Cabot M.D.   On: 09/14/2021 15:39  ? ?DG C-Arm 1-60 Min-No Report ? ?Result Date: 09/14/2021 ?Fluoroscopy was utilized by the requesting physician.  No radiographic interpretation.  ? ?DG C-Arm 1-60 Min-No Report ? ?Result Date:  09/14/2021 ?Fluoroscopy was utilized by the requesting physician.  No radiographic interpretation.  ? ?DG C-Arm 1-60 Min-No Report ? ?Result Date: 09/14/2021 ?Fluoroscopy was utilized by the requesting physician.  No radiographic interpretation.  ? ?DG C-Arm 1-60 Min-No Report ? ?Result Date: 09/14/2021 ?Fluoroscopy was utilized by the requesting physician.  No radiographic interpretation.   ? ?Antibiotics:  ?Anti-infectives (From admission, onward)  ? ? Start     Dose/Rate Route Frequency Ordered Stop  ? 09/14/21 2000  ceFAZolin (ANCEF) IVPB 2g/100 mL premix       ? 2 g ?200 mL/hr over 30 Minutes  Intravenous Every 8 hours 09/14/21 1758 09/15/21 0336  ? 09/14/21 0915  ceFAZolin (ANCEF) IVPB 3g/100 mL premix       ? 3 g ?200 mL/hr over 30 Minutes Intravenous On call to O.R. 09/14/21 0817 09/14/21 1235  ? ?  ? ? ?Discharge Exam: ?Blood pressure (!) 149/76, pulse 65, temperature (!) 97.5 ?F (36.4 ?C), temperature source Oral, resp. rate 18, height '6\' 1"'$  (1.854 m), weight 133.4 kg, SpO2 97 %. ?Neurologic: Grossly normal ?Ambualting and voiding well incision cdi  ? ?Discharge Medications:   ?Allergies as of 09/16/2021   ? ?   Reactions  ? Gabapentin Swelling  ? Keppra [levetiracetam] Nausea And Vomiting  ? Other Rash  ? Plastic tape: Paper tape ONLY!  ? Valium [diazepam] Other (See Comments)  ? Significant depression; "a different person"  ? ?  ? ?  ?Medication List  ?  ? ?TAKE these medications   ? ?acetaminophen 500 MG tablet ?Commonly known as: TYLENOL ?Take 2 tablets (1,000 mg total) by mouth every 8 (eight) hours. ?  ?aspirin 81 MG chewable tablet ?Chew 81 mg by mouth at bedtime. ?  ?cetirizine 10 MG tablet ?Commonly known as: ZYRTEC ?Take 10 mg by mouth daily. ?  ?docusate sodium 100 MG capsule ?Commonly known as: Colace ?Take 1 capsule (100 mg total) by mouth 2 (two) times daily. ?What changed: when to take this ?  ?famotidine 10 MG tablet ?Commonly known as: PEPCID ?Take 10 mg by mouth daily. ?  ?hydrOXYzine 25 MG tablet ?Commonly known as: ATARAX ?Take 25 mg by mouth at bedtime. ?  ?lisinopril-hydrochlorothiazide 10-12.5 MG tablet ?Commonly known as: ZESTORETIC ?Take 1 tablet by mouth daily. ?  ?methocarbamol 750 MG tablet ?Commonly known as: ROBAXIN ?Take 750 mg by mouth 3 (three) times daily. ?  ?morphine 30 MG 12 hr tablet ?Commonly known as: MS CONTIN ?Take 30 mg by mouth every 12 (twelve) hours. ?  ?nitroGLYCERIN 0.4 MG SL tablet ?Commonly known as: NITROSTAT ?Place 1 tablet (0.4 mg total) under the tongue every 5 (five) minutes as needed for chest pain. ?  ?nystatin cream ?Commonly known as:  MYCOSTATIN ?Apply 1 application. topically daily as needed (yeast). ?  ?oxyCODONE 15 MG immediate release tablet ?Commonly known as: ROXICODONE ?Take 7.5-15 mg by mouth every 4 (four) hours as needed for pain. ?  ?Oxycodone HCl 10 MG Tabs ?Take 1 tablet (10 mg total) by mouth every 4 (four) hours as needed for severe pain ((score 4 to 6)). ?  ?polyethylene glycol 17 g packet ?Commonly known as: MIRALAX / GLYCOLAX ?Take 17 g by mouth 2 (two) times daily. ?  ?pravastatin 80 MG tablet ?Commonly known as: PRAVACHOL ?Take 1 tablet (80 mg total) by mouth daily. ?  ?pregabalin 75 MG capsule ?Commonly known as: LYRICA ?TAKE ONE CAPSULE BY MOUTH THREE TIMES DAILY ?What changed:  ?how much to take ?when to  take this ?additional instructions ?  ?promethazine 12.5 MG tablet ?Commonly known as: PHENERGAN ?Take 2 tablets (25 mg total) by mouth every 6 (six) hours as needed for nausea. ?  ?sildenafil 100 MG tablet ?Commonly known as: VIAGRA ?Take 100 mg by mouth daily as needed for erectile dysfunction. ?  ?sodium chloride 0.65 % Soln nasal spray ?Commonly known as: OCEAN ?Place 1 spray into both nostrils as needed for congestion. ?  ?testosterone cypionate 200 MG/ML injection ?Commonly known as: DEPOTESTOSTERONE CYPIONATE ?Inject 200 mg into the muscle every 14 (fourteen) days. ?  ?traMADol 50 MG tablet ?Commonly known as: ULTRAM ?Take 1-2 tablets (50-100 mg total) by mouth every 6 (six) hours as needed for moderate pain or severe pain. ?  ?Vitamin D (Ergocalciferol) 1.25 MG (50000 UNIT) Caps capsule ?Commonly known as: DRISDOL ?Take 50,000 Units by mouth 2 (two) times a week. ?  ?zolpidem 10 MG tablet ?Commonly known as: AMBIEN ?Take 10 mg by mouth at bedtime. ?  ? ?  ? ?  ?  ? ? ?  ?Durable Medical Equipment  ?(From admission, onward)  ?  ? ? ?  ? ?  Start     Ordered  ? 09/14/21 [place]  DME Walker rolling  Once       ?Question:  Patient needs a walker to treat with the following condition  Answer:  S/P lumbar fusion  ? 09/14/21  1834  ? 09/14/21 1835  DME 3 n 1  Once       ? 09/14/21 1834  ? ?  ?  ? ?  ? ? ?Disposition: home  ? ?Final Dx: PLIF L2-3 ? ?Discharge Instructions   ? ?  Remove dressing in 72 hours   Complete by: As directed ?  ? C

## 2021-09-16 NOTE — Progress Notes (Signed)
Patient alert and oriented, voiding adequately, skin clean, dry and intact without evidence of skin break down, or symptoms of complications - no redness or edema noted, only slight tenderness at site.  Patient states pain is manageable at time of discharge. Patient has an appointment with MD in 2 weeks 

## 2021-09-21 ENCOUNTER — Other Ambulatory Visit (HOSPITAL_COMMUNITY): Payer: Self-pay | Admitting: Neurological Surgery

## 2021-09-21 ENCOUNTER — Encounter (HOSPITAL_COMMUNITY): Payer: Self-pay | Admitting: Radiology

## 2021-09-21 ENCOUNTER — Ambulatory Visit (HOSPITAL_COMMUNITY)
Admission: RE | Admit: 2021-09-21 | Discharge: 2021-09-21 | Disposition: A | Discharge status: Home / Self Care | Payer: Medicare Other | Source: Ambulatory Visit | Attending: Neurological Surgery | Admitting: Neurological Surgery

## 2021-09-21 ENCOUNTER — Other Ambulatory Visit: Payer: Self-pay | Admitting: Neurological Surgery

## 2021-09-21 DIAGNOSIS — M546 Pain in thoracic spine: Secondary | ICD-10-CM | POA: Diagnosis present

## 2021-09-22 ENCOUNTER — Ambulatory Visit (HOSPITAL_COMMUNITY): Payer: Medicare Other

## 2021-09-30 ENCOUNTER — Ambulatory Visit (INDEPENDENT_AMBULATORY_CARE_PROVIDER_SITE_OTHER): Payer: Medicare Other | Admitting: Nurse Practitioner

## 2021-09-30 ENCOUNTER — Telehealth: Payer: Self-pay

## 2021-09-30 ENCOUNTER — Encounter: Payer: Self-pay | Admitting: Nurse Practitioner

## 2021-09-30 ENCOUNTER — Other Ambulatory Visit: Payer: Self-pay | Admitting: Neurological Surgery

## 2021-09-30 ENCOUNTER — Telehealth: Payer: Self-pay | Admitting: Cardiovascular Disease

## 2021-09-30 DIAGNOSIS — Z01810 Encounter for preprocedural cardiovascular examination: Secondary | ICD-10-CM

## 2021-09-30 NOTE — Telephone Encounter (Signed)
Contacted the patient and scheduled a telehealth visit for today; patient agreeable and voiced understand. ?

## 2021-09-30 NOTE — Progress Notes (Signed)
? ?Virtual Visit via Telephone Note  ? ?This visit type was conducted due to national recommendations for restrictions regarding the COVID-19 Pandemic (e.g. social distancing) in an effort to limit this patient's exposure and mitigate transmission in our community.  Due to his co-morbid illnesses, this patient is at least at moderate risk for complications without adequate follow up.  This format is felt to be most appropriate for this patient at this time.  The patient did not have access to video technology/had technical difficulties with video requiring transitioning to audio format only (telephone).  All issues noted in this document were discussed and addressed.  No physical exam could be performed with this format.  Please refer to the patient's chart for his  consent to telehealth for [facility]. ? ?Evaluation Performed:  Preoperative cardiovascular risk assessment ?_____________  ? ?Date:  09/30/2021  ? ?Patient ID:  Darin Gomez, Darin Gomez [birth year], MRN [id number] ?Patient Location:  ?Home ?Provider location:   ?Office ? ?Primary Care Provider:  Cyndi Bender, PA-C ?Primary Cardiologist:  None ? ?Chief Complaint  ?  ?71 y.o. y/o male with a h/o HTN,HLD,GERD, aortic atherosclerosis, who is pending Spinal Cord Stimulator via T-9 Laminectomy , and presents today for telephonic preoperative cardiovascular risk assessment. ? ?Past Medical History  ?  ?Past Medical History:  ?Diagnosis Date  ? Allergy   ? Arthritis   ? mainly in back  ? Back pain   ? Balance disorder   ? uses cane daily  ? Blood in urine   ? Chest pain   ? Constipation   ? Diverticulosis 02/14/2018  ? noted on colonoscopy  ? Environmental allergies   ? Foot swelling   ? Bilateral  ? GERD (gastroesophageal reflux disease)   ? Headache(784.0)   ? occas.  migraines  (from last surgery)  ? Heart murmur   ? as child....Marland Kitchenno problems as an adult  ? History of colon polyps 02/14/2018  ? Hyperlipidemia   ? Hypertension   ? dx  10 yrs or more  ago  ?  Insomnia   ? Neck pain   ? Neuromuscular disorder ([place])   ? bilateral feet  ? Night sweats   ? Painful swelling of joint   ? PONV (postoperative nausea and vomiting)   ? Poor circulation of extremity   ? legs  ? Shortness of breath   ? with exertion  ? Sinus disorder   ? Spinal headache   ? Vertigo   ? ?Past Surgical History:  ?Procedure Laterality Date  ? ABDOMINAL EXPOSURE N/A 09/14/2019  ? Procedure: ABDOMINAL EXPOSURE;  Surgeon: [name], MD;  Location: [facility];  Service: Vascular;  Laterality: N/A;  ? ANTERIOR LAT LUMBAR FUSION Right 09/14/2019  ? Procedure: Right Lumbar three-four Lumbar four-five  Anterolateral lumbar interbody fusion;  Surgeon: Erline Levine, MD;  Location: [facility];  Service: Neurosurgery;  Laterality: Right;  ? ANTERIOR LUMBAR FUSION N/A 09/14/2019  ? Procedure: Lumbar five Sacral one Anterior lumbar interbody fusion;  Surgeon: Erline Levine, MD;  Location: [facility];  Service: Neurosurgery;  Laterality: N/A;  ? BACK SURGERY    ? CARDIAC CATHETERIZATION    ? 03/04/14  ? COLONOSCOPY    ? FRACTURE SURGERY    ? left arm  ? KNEE SURGERY Right 01/02/2018  ? torn minicus  ? LEFT HEART CATHETERIZATION WITH CORONARY ANGIOGRAM N/A 03/04/2014  ? Procedure: LEFT HEART CATHETERIZATION WITH CORONARY ANGIOGRAM;  Surgeon: Peter M Martinique, MD;  Location: [facility] C[facility];  Service: Cardiovascular;  Laterality: N/A;  ? LUMBAR LAMINECTOMY/DECOMPRESSION MICRODISCECTOMY  03/15/2012  ? Procedure: LUMBAR LAMINECTOMY/DECOMPRESSION MICRODISCECTOMY 2 LEVELS;  Surgeon: Floyce Stakes, MD;  Location: [facility];  Service: Neurosurgery;  Laterality: Bilateral;  Bilateral Lumbar three, lumbar four, lumbar five Laminectomy  ? LUMBAR PERCUTANEOUS PEDICLE SCREW 3 LEVEL N/A 09/14/2019  ? Procedure: Lumbar three to Sacral one posterior pedicle screw fixation;  Surgeon: Erline Levine, MD;  Location: [facility];  Service: Neurosurgery;  Laterality: N/A;  ? POLYPECTOMY    ? SPINAL CORD STIMULATOR BATTERY EXCHANGE N/A 09/14/2021  ?  Procedure: Removal of spinal cord stimulator leads;  Surgeon: [name], MD;  Location: [facility];  Service: Neurosurgery;  Laterality: N/A;  ? SPINAL CORD STIMULATOR INSERTION N/A 05/29/2014  ? Procedure: LUMBAR SPINAL CORD STIMULATOR INSERTION;  Surgeon: Bonna Gains, MD;  Location: [facility];  Service: Neurosurgery;  Laterality: N/A;  ? TOTAL KNEE ARTHROPLASTY Right 02/08/2019  ? Procedure: TOTAL KNEE ARTHROPLASTY;  Surgeon: Paralee Cancel, MD;  Location: [facility];  Service: Orthopedics;  Laterality: Right;  70 mins  ? ? ?Allergies ? ?Allergies  ?Allergen Reactions  ? Gabapentin Swelling  ? Keppra [Levetiracetam] Nausea And Vomiting  ? Other Rash  ?  Plastic tape: Paper tape ONLY! ?  ? Valium [Diazepam] Other (See Comments)  ?  Significant depression; "a different person"  ? ? ?History of Present Illness  ?  ?Darin Gomez is a 71 y.o. male who presents via audio/video conferencing for a telehealth visit today.  Pt was last seen in cardiology clinic on 01/27/2021 by Dr. Rockey Situ. At that time Darin Gomez was doing well with the exception of atypical CP that was ruled out as ischemia following cardiac CTA. He currently denies any chest pain or SOB.He recently had fusion surgery to his lumbar spine that he tolerated without complication. ? ?Home Medications  ?  ?Prior to Admission medications   ?Medication Sig Start Date End Date Taking? Authorizing Provider  ?acetaminophen (TYLENOL) 500 MG tablet Take 2 tablets (1,000 mg total) by mouth every 8 (eight) hours. 02/09/19   Danae Orleans, PA-C  ?aspirin 81 MG chewable tablet Chew 81 mg by mouth at bedtime.    [provider]  ?cetirizine (ZYRTEC) 10 MG tablet Take 10 mg by mouth daily.     [provider]  ?docusate sodium (COLACE) 100 MG capsule Take 1 capsule (100 mg total) by mouth 2 (two) times daily. ?Patient taking differently: Take 100 mg by mouth daily. 02/09/19   Danae Orleans, PA-C  ?famotidine (PEPCID) 10 MG tablet Take 10 mg by  mouth daily.    [provider]  ?hydrOXYzine (ATARAX) 25 MG tablet Take 25 mg by mouth at bedtime.    [provider]  ?lisinopril-hydrochlorothiazide (ZESTORETIC) 10-12.5 MG tablet Take 1 tablet by mouth daily. 01/27/21   Minna Merritts, MD  ?methocarbamol (ROBAXIN) 750 MG tablet Take 750 mg by mouth 3 (three) times daily. 11/20/20   [provider]  ?morphine (MS CONTIN) 30 MG 12 hr tablet Take 30 mg by mouth every 12 (twelve) hours.    [provider]  ?nitroGLYCERIN (NITROSTAT) 0.4 MG SL tablet Place 1 tablet (0.4 mg total) under the tongue every 5 (five) minutes as needed for chest pain. 01/27/21   Minna Merritts, MD  ?nystatin cream (MYCOSTATIN) Apply 1 application. topically daily as needed (yeast).    [provider]  ?oxyCODONE (ROXICODONE) 15 MG immediate release tablet Take 7.5-15  mg by mouth every 4 (four) hours as needed for pain.    [provider]  ?oxyCODONE 10 MG TABS Take 1 tablet (10 mg total) by mouth every 4 (four) hours as needed for severe pain ((score 4 to 6)). ?Patient not taking: Reported on 09/01/2021 09/23/19   Viona Gilmore D, NP  ?polyethylene glycol (MIRALAX / GLYCOLAX) 17 g packet Take 17 g by mouth 2 (two) times daily. 02/09/19   Danae Orleans, PA-C  ?pravastatin (PRAVACHOL) 80 MG tablet Take 1 tablet (80 mg total) by mouth daily. 01/27/21   Minna Merritts, MD  ?pregabalin (LYRICA) 75 MG capsule TAKE ONE CAPSULE BY MOUTH THREE TIMES DAILY  ?Patient taking differently: Take 75-150 mg by mouth See admin instructions. Take 75 mg by mouth in the morning and afternoon and 150 mg at bedtime 12/13/18   Gillis Santa, MD  ?promethazine (PHENERGAN) 12.5 MG tablet Take 2 tablets (25 mg total) by mouth every 6 (six) hours as needed for nausea. ?Patient not taking: Reported on 09/01/2021 03/26/12   Newman Pies, MD  ?sildenafil (VIAGRA) 100 MG tablet Take 100 mg by mouth daily as needed for erectile dysfunction.    [provider]  ?sodium chloride (OCEAN) 0.65 % SOLN nasal spray Place 1 spray into both nostrils as needed for congestion.    [provider]  ?testosterone cypionate (DEPOTESTOSTERONE CYPIONATE) 200 MG/ML inje

## 2021-09-30 NOTE — Telephone Encounter (Signed)
?  Patient Consent for Virtual Visit  ? ? ?    ? ?Darin Gomez has provided verbal consent on 09/30/2021 for a virtual visit (video or telephone). ? ? ?CONSENT FOR VIRTUAL VISIT FOR:  Darin Gomez  ?By participating in this virtual visit I agree to the following: ? ?I hereby voluntarily request, consent and authorize [facility] and its employed or contracted physicians, physician assistants, nurse practitioners or other licensed health care professionals (the Practitioner), to provide me with telemedicine health care services (the ?Services") as deemed necessary by the treating Practitioner. I acknowledge and consent to receive the Services by the Practitioner via telemedicine. I understand that the telemedicine visit will involve communicating with the Practitioner through live audiovisual communication technology and the disclosure of certain medical information by electronic transmission. I acknowledge that I have been given the opportunity to request an in-person assessment or other available alternative prior to the telemedicine visit and am voluntarily participating in the telemedicine visit. ? ?I understand that I have the right to withhold or withdraw my consent to the use of telemedicine in the course of my care at any time, without affecting my right to future care or treatment, and that the Practitioner or I may terminate the telemedicine visit at any time. I understand that I have the right to inspect all information obtained and/or recorded in the course of the telemedicine visit and may receive copies of available information for a reasonable fee.  I understand that some of the potential risks of receiving the Services via telemedicine include:  ?Delay or interruption in medical evaluation due to technological equipment failure or disruption; ?Information transmitted may not be sufficient (e.g. poor resolution of images) to allow for appropriate medical decision making by the Practitioner;  and/or  ?In rare instances, security protocols could fail, causing a breach of personal health information. ? ?Furthermore, I acknowledge that it is my responsibility to provide information about my medical history, conditions and care that is complete and accurate to the best of my ability. I acknowledge that Practitioner's advice, recommendations, and/or decision may be based on factors not within their control, such as incomplete or inaccurate data provided by me or distortions of diagnostic images or specimens that may result from electronic transmissions. I understand that the practice of medicine is not an exact science and that Practitioner makes no warranties or guarantees regarding treatment outcomes. I acknowledge that a copy of this consent can be made available to me via my patient portal ([facility]), or I can request a printed copy by calling the office of [facility].   ? ?I understand that my insurance will be billed for this visit.  ? ?I have read or had this consent read to me. ?I understand the contents of this consent, which adequately explains the benefits and risks of the Services being provided via telemedicine.  ?I have been provided ample opportunity to ask questions regarding this consent and the Services and have had my questions answered to my satisfaction. ?I give my informed consent for the services to be provided through the use of telemedicine in my medical care ? ? ? ?

## 2021-09-30 NOTE — Telephone Encounter (Signed)
? ?  Pre-operative Risk Assessment  ?  ?Patient Name: Darin Gomez  ?DOB: [birth year] ?MRN: [id number]  ? ?  ? ?Request for Surgical Clearance   ? ?Procedure:   Spinal Cord Stimulator via T-9 Laminectomy  ? ?Date of Surgery:  Clearance 10/07/21                              ?   ?Surgeon:  Dr [name] ?Surgeon's Group or Practice Name:  [facility] ?Phone number:  [phone] ?Fax number:  [phone] ?  ?Type of Clearance Requested:   ?- Medical  ?  ?Type of Anesthesia:  General  ?  ?Additional requests/questions:  Please advise surgeon/provider what medications should be held. ? ?Signed, ?Caryl Pina Gerringer   ?09/30/2021, 3:18 PM  ? ?

## 2021-09-30 NOTE — Telephone Encounter (Signed)
? ? ?  Name: Darin Gomez  ?DOB: 1951/05/23  ?MRN: [id number] ? ?Primary Cardiologist: Rockey Situ ? ? ?Preoperative team, please contact this patient and set up a phone call appointment for further preoperative risk assessment. Please obtain consent and complete medication review. Thank you for your help. ? ?We are not asked about holding ASA but is listed on patient 's MAR and can be discussed with Dr.Gollan if needed. ? ?[name], Marissa Nestle, NP ?09/30/2021, 3:41 PM ?[facility] ?[address] ?[place], [place] [address] ?  ?

## 2021-10-05 ENCOUNTER — Other Ambulatory Visit: Payer: Self-pay

## 2021-10-05 ENCOUNTER — Encounter (HOSPITAL_COMMUNITY): Payer: Self-pay | Admitting: Neurological Surgery

## 2021-10-05 NOTE — Pre-Procedure Instructions (Signed)
?[facility] [id number] ?[place] ?[place] [address] ?Phone: [phone] Fax: [phone] ? ? ?PCP - Cyndi Bender, PA-C ?Cardiologist - Minna Merritts, MD ? ? ?EKG - 01/27/21 ?Cardiac Cath - 03/04/14 ? ?Aspirin Instructions: Stopped on 09/29/21 ? ?ERAS Protcol - NPO ? ?COVID TEST- N ? ?Anesthesia review: Y ? ?Patient verbally denies any shortness of breath, fever, cough and chest pain during phone call ? ? ?-------------  SDW INSTRUCTIONS given: ? ?Your procedure is scheduled on 10/07/21. ? Report to [facility] Main Entrance "A" at 0900 A.M., and check in at the Admitting office. ? Call this number if you have problems the morning of surgery: ? [phone] ? ? Remember: ? Do not eat or drink after midnight the night before your surgery ?  ? Take these medicines the morning of surgery with A SIP OF WATER  ?Zyrtec ?Pepcid ?Robaxin ?Morphine ?Pravastatin ?Lyrica ?Tylenol ? ?As of today, STOP taking any Aspirin (unless otherwise instructed by your surgeon) Aleve, Naproxen, Ibuprofen, Motrin, Advil, Goody's, BC's, all herbal medications, fish oil, and all vitamins. ? ?         ?           Do not wear jewelry, make up, or nail polish ?           Do not wear lotions, powders, perfumes/colognes, or deodorant. ?           Do not shave 48 hours prior to surgery.  Men may shave face and neck. ?           Do not bring valuables to the hospital. ?           [facility] is not responsible for any belongings or valuables. ? ?Do NOT Smoke (Tobacco/Vaping) 24 hours prior to your procedure ?If you use a CPAP at night, you may bring all equipment for your overnight stay. ?  ?Contacts, glasses, dentures or bridgework may not be worn into surgery.    ?  ?For patients admitted to the hospital, discharge time will be determined by your treatment team. ?  ?Patients discharged the day of surgery will not be allowed to drive home, and someone needs to stay with them for 24  hours. ? ? ? ?Special instructions:   ?[facility]- Preparing For Surgery ? ?Before surgery, you can play an important role. Because skin is not sterile, your skin needs to be as free of germs as possible. You can reduce the number of germs on your skin by washing with CHG (chlorahexidine gluconate) Soap before surgery.  CHG is an antiseptic cleaner which kills germs and bonds with the skin to continue killing germs even after washing.   ? ?Oral Hygiene is also important to reduce your risk of infection.  Remember - BRUSH YOUR TEETH THE MORNING OF SURGERY WITH YOUR REGULAR TOOTHPASTE ? ?Please do not use if you have an allergy to CHG or antibacterial soaps. If your skin becomes reddened/irritated stop using the CHG.  ?Do not shave (including legs and underarms) for at least 48 hours prior to first CHG shower. It is OK to shave your face. ? ?Please follow these instructions carefully. ?  ?Shower the NIGHT BEFORE SURGERY and the MORNING OF SURGERY with DIAL Soap.  ? ?Pat yourself dry with a CLEAN TOWEL. ? ?Wear CLEAN PAJAMAS to bed the night before surgery ? ?Place CLEAN SHEETS on your bed the night of your first  shower and DO NOT SLEEP WITH PETS. ? ? ?Day of Surgery: ?Please shower morning of surgery  ?Wear Clean/Comfortable clothing the morning of surgery ?Do not apply any deodorants/lotions.   ?Remember to brush your teeth WITH YOUR REGULAR TOOTHPASTE. ?  ?Questions were answered. Patient verbalized understanding of instructions.  ? ? ?    ?

## 2021-10-06 NOTE — Progress Notes (Signed)
Anesthesia Chart Review: ?Same day workup ? ? ?Follows with cardiology for history of atypical chest pain -no significant CAD by cath 2015.  Seen by Ambrose Pancoast, NP via telemedicine visit for preop evaluation on 09/30/2021.  Per note, "The patient affirms he has been doing well without any new cardiac symptoms. They are able to achieve 4 METS without cardiac limitations. Therefore, based on ACC/AHA guidelines, the patient would be at acceptable risk for the planned procedure without further cardiovascular testing. The patient was advised that if he develops new symptoms prior to surgery to contact our office to arrange for a follow-up visit, and he verbalized understanding. RCRI: 0.4% low risk. Patient stated that his surgeon requested that he hold his Asprin prior to his upcoming procedure.  There is no contraindication from cardiology perspective for holding aspirin at this time" ? ?Patient will need day of surgery labs and evaluation. ?  ?EKG 01/27/2021: NSR. Rate 76. ?  ?Cath 03/04/2014: ?Final Conclusions:   ?1. No significant CAD ?2. Normal LV function. ? ?Karoline Caldwell, PA-C ?[facility] Short Stay Center/Anesthesiology ?Phone [phone] ?10/06/2021 11:31 AM ? ?

## 2021-10-06 NOTE — Anesthesia Preprocedure Evaluation (Addendum)
Anesthesia Evaluation  ?Patient identified by MRN, date of birth, ID band ?Patient awake ? ? ? ?Reviewed: ?Allergy & Precautions, NPO status , Patient's Chart, lab work & pertinent test results ? ?History of Anesthesia Complications ?(+) PONV, POST - OP SPINAL HEADACHE and history of anesthetic complications ? ?Airway ?Mallampati: II ? ?TM Distance: >3 FB ?Neck ROM: Full ? ? ? Dental ?no notable dental hx. ? ?  ?Pulmonary ?neg pulmonary ROS, former smoker,  ?  ?Pulmonary exam normal ?breath sounds clear to auscultation ? ? ? ? ? ? Cardiovascular ?hypertension, Pt. on medications ?Normal cardiovascular exam ?Rhythm:Regular Rate:Normal ? ?'15 cath: Cardiac catheterization revealed only minor coronary artery irregularities ?  ?Neuro/Psych ? Headaches, Pain (post laminectomy syndrome) ?vertigo ?negative psych ROS  ? GI/Hepatic ?Neg liver ROS, GERD  Medicated,  ?Endo/Other  ?negative endocrine ROS ? Renal/GU ?negative Renal ROS  ?negative genitourinary ?  ?Musculoskeletal ?negative musculoskeletal ROS ?(+)  ? Abdominal ?  ?Peds ?negative pediatric ROS ?(+)  Hematology ?negative hematology ROS ?(+)   ?Anesthesia Other Findings ? ? Reproductive/Obstetrics ?negative OB ROS ? ?  ? ? ? ? ? ? ? ? ? ? ? ? ? ?  ?  ? ? ? ? ? ? ?Anesthesia Physical ?Anesthesia Plan ? ?ASA: 2 ? ?Anesthesia Plan: General  ? ?Post-op Pain Management: Ofirmev IV (intra-op)*  ? ?Induction: Intravenous ? ?PONV Risk Score and Plan: 3 and Ondansetron, Dexamethasone and Treatment may vary due to age or medical condition ? ?Airway Management Planned: Oral ETT ? ?Additional Equipment:  ? ?Intra-op Plan:  ? ?Post-operative Plan: Extubation in OR ? ?Informed Consent: I have reviewed the patients History and Physical, chart, labs and discussed the procedure including the risks, benefits and alternatives for the proposed anesthesia with the patient or authorized representative who has indicated his/her understanding and  acceptance.  ? ? ? ?Dental advisory given ? ?Plan Discussed with: CRNA and Surgeon ? ?Anesthesia Plan Comments: (PAT note by Karoline Caldwell, PA-C: ?Follows with cardiology for history of atypical chest pain -no significant CAD by cath 2015.??Seen by Ambrose Pancoast, NP via telemedicine visit for preop evaluation on 09/30/2021. ?Per note, "The patient affirms?he?has been doing well without any new cardiac symptoms. They are able to achieve 4?METS without cardiac limitations. Therefore, based on ACC/AHA guidelines, the patient would be at acceptable risk for the planned procedure without further cardiovascular testing. The patient was advised that if he?develops new symptoms prior to surgery to contact our office to arrange for a follow-up visit, and he?verbalized understanding. RCRI: 0.4% low risk. Patient stated that his surgeon requested that he hold his Asprin prior to his upcoming procedure.??There is no contraindication from cardiology perspective for holding aspirin at this time" ? ?Patient will need day of surgery labs and evaluation. ?? ?EKG 01/27/2021:?NSR. Rate 76. ?? ?Cath 03/04/2014: ?Final Conclusions: ? ?1. No significant CAD ?2. Normal LV function. ?)  ? ? ? ? ? ?Anesthesia Quick Evaluation ? ?

## 2021-10-07 ENCOUNTER — Encounter (HOSPITAL_COMMUNITY): Payer: Self-pay | Admitting: Neurological Surgery

## 2021-10-07 ENCOUNTER — Ambulatory Visit (HOSPITAL_COMMUNITY): Payer: Medicare Other | Admitting: Vascular Surgery

## 2021-10-07 ENCOUNTER — Observation Stay (HOSPITAL_COMMUNITY)
Admission: RE | Admit: 2021-10-07 | Discharge: 2021-10-08 | Disposition: A | Discharge status: Home / Self Care | Payer: Medicare Other | Attending: Neurological Surgery | Admitting: Neurological Surgery

## 2021-10-07 ENCOUNTER — Other Ambulatory Visit: Payer: Self-pay

## 2021-10-07 ENCOUNTER — Encounter (HOSPITAL_COMMUNITY)
Admission: RE | Disposition: A | Discharge status: Home / Self Care | Payer: Self-pay | Source: Home / Self Care | Attending: Neurological Surgery

## 2021-10-07 ENCOUNTER — Ambulatory Visit (HOSPITAL_COMMUNITY): Payer: Medicare Other

## 2021-10-07 ENCOUNTER — Ambulatory Visit (HOSPITAL_BASED_OUTPATIENT_CLINIC_OR_DEPARTMENT_OTHER): Payer: Medicare Other | Admitting: Vascular Surgery

## 2021-10-07 DIAGNOSIS — M79604 Pain in right leg: Secondary | ICD-10-CM | POA: Insufficient documentation

## 2021-10-07 DIAGNOSIS — M961 Postlaminectomy syndrome, not elsewhere classified: Principal | ICD-10-CM | POA: Insufficient documentation

## 2021-10-07 DIAGNOSIS — I10 Essential (primary) hypertension: Secondary | ICD-10-CM | POA: Insufficient documentation

## 2021-10-07 DIAGNOSIS — Z6837 Body mass index (BMI) 37.0-37.9, adult: Secondary | ICD-10-CM | POA: Insufficient documentation

## 2021-10-07 DIAGNOSIS — Z7982 Long term (current) use of aspirin: Secondary | ICD-10-CM | POA: Diagnosis not present

## 2021-10-07 DIAGNOSIS — M79605 Pain in left leg: Secondary | ICD-10-CM | POA: Insufficient documentation

## 2021-10-07 DIAGNOSIS — Z9682 Presence of neurostimulator: Secondary | ICD-10-CM | POA: Insufficient documentation

## 2021-10-07 DIAGNOSIS — Z96651 Presence of right artificial knee joint: Secondary | ICD-10-CM | POA: Diagnosis not present

## 2021-10-07 DIAGNOSIS — R519 Headache, unspecified: Secondary | ICD-10-CM | POA: Insufficient documentation

## 2021-10-07 DIAGNOSIS — Z8679 Personal history of other diseases of the circulatory system: Secondary | ICD-10-CM | POA: Diagnosis not present

## 2021-10-07 DIAGNOSIS — K219 Gastro-esophageal reflux disease without esophagitis: Secondary | ICD-10-CM | POA: Insufficient documentation

## 2021-10-07 DIAGNOSIS — M545 Low back pain, unspecified: Secondary | ICD-10-CM | POA: Diagnosis not present

## 2021-10-07 DIAGNOSIS — Z8249 Family history of ischemic heart disease and other diseases of the circulatory system: Secondary | ICD-10-CM | POA: Insufficient documentation

## 2021-10-07 DIAGNOSIS — Z87891 Personal history of nicotine dependence: Secondary | ICD-10-CM | POA: Diagnosis not present

## 2021-10-07 DIAGNOSIS — Z9689 Presence of other specified functional implants: Secondary | ICD-10-CM

## 2021-10-07 DIAGNOSIS — Z981 Arthrodesis status: Secondary | ICD-10-CM | POA: Diagnosis not present

## 2021-10-07 DIAGNOSIS — R42 Dizziness and giddiness: Secondary | ICD-10-CM | POA: Diagnosis not present

## 2021-10-07 DIAGNOSIS — Z79899 Other long term (current) drug therapy: Secondary | ICD-10-CM | POA: Insufficient documentation

## 2021-10-07 HISTORY — PX: SPINAL CORD STIMULATOR INSERTION: SHX5378

## 2021-10-07 LAB — BASIC METABOLIC PANEL
Anion gap: 8 (ref 5–15)
BUN: 15 mg/dL (ref 8–23)
CO2: 27 mmol/L (ref 22–32)
Calcium: 9 mg/dL (ref 8.9–10.3)
Chloride: 100 mmol/L (ref 98–111)
Creatinine, Ser: 0.95 mg/dL (ref 0.61–1.24)
GFR, Estimated: >60 mL/min (ref >60)
Glucose, Bld: 105 mg/dL — ABNORMAL HIGH (ref 70–99)
Potassium: 4.2 mmol/L (ref 3.5–5.1)
Sodium: 135 mmol/L (ref 135–145)

## 2021-10-07 LAB — PROTIME-INR
INR: 1 (ref 0.8–1.2)
Prothrombin Time: 13.3 seconds (ref 11.4–15.2)

## 2021-10-07 LAB — CBC
HCT: 43.8 % (ref 39.0–52.0)
Hemoglobin: 15.2 g/dL (ref 13.0–17.0)
MCH: 31.9 pg (ref 26.0–34.0)
MCHC: 34.7 g/dL (ref 30.0–36.0)
MCV: 92 fL (ref 80.0–100.0)
Platelets: 340 10*3/uL (ref 150–400)
RBC: 4.76 MIL/uL (ref 4.22–5.81)
RDW: 11.9 % (ref 11.5–15.5)
WBC: 7.5 10*3/uL (ref 4.0–10.5)
nRBC: 0 % (ref 0.0–0.2)

## 2021-10-07 LAB — SURGICAL PCR SCREEN
MRSA, PCR: NEGATIVE
Staphylococcus aureus: NEGATIVE

## 2021-10-07 SURGERY — INSERTION, SPINAL CORD STIMULATOR, LUMBAR
Anesthesia: General | Site: Back

## 2021-10-07 MED ORDER — SODIUM CHLORIDE 0.9 % IV SOLN
250.0000 mL | INTRAVENOUS | Status: DC
  Administered 2021-10-07: 250 mL via INTRAVENOUS

## 2021-10-07 MED ORDER — SUGAMMADEX SODIUM 200 MG/2ML IV SOLN
INTRAVENOUS | Status: DC | PRN
  Administered 2021-10-07: 200 mg via INTRAVENOUS

## 2021-10-07 MED ORDER — METHOCARBAMOL 750 MG PO TABS
750.0000 mg | ORAL_TABLET | Freq: Three times a day (TID) | ORAL | Status: DC
  Administered 2021-10-07 – 2021-10-08 (×3): 750 mg via ORAL
  Filled 2021-10-07 (×3): qty 1

## 2021-10-07 MED ORDER — HYDROXYZINE HCL 25 MG PO TABS
25.0000 mg | ORAL_TABLET | Freq: Every day | ORAL | Status: DC
  Administered 2021-10-07: 25 mg via ORAL
  Filled 2021-10-07: qty 1

## 2021-10-07 MED ORDER — LACTATED RINGERS IV SOLN: INTRAVENOUS | Status: DC

## 2021-10-07 MED ORDER — KETAMINE HCL 10 MG/ML IJ SOLN
INTRAMUSCULAR | Status: DC | PRN
  Administered 2021-10-07: 50 mg via INTRAVENOUS

## 2021-10-07 MED ORDER — LISINOPRIL 10 MG PO TABS
10.0000 mg | ORAL_TABLET | Freq: Every day | ORAL | Status: DC
  Administered 2021-10-07 – 2021-10-08 (×2): 10 mg via ORAL
  Filled 2021-10-07 (×2): qty 1

## 2021-10-07 MED ORDER — SODIUM CHLORIDE 0.9% FLUSH: 3.0000 mL | INTRAVENOUS | Status: DC | PRN

## 2021-10-07 MED ORDER — OXYCODONE HCL 5 MG/5ML PO SOLN: 5.0000 mg | Freq: Once | ORAL | Status: DC | PRN

## 2021-10-07 MED ORDER — HYDROMORPHONE HCL 1 MG/ML IJ SOLN
INTRAMUSCULAR | Status: AC
  Filled 2021-10-07: qty 1

## 2021-10-07 MED ORDER — PHENOL 1.4 % MT LIQD: 1.0000 | OROMUCOSAL | Status: DC | PRN

## 2021-10-07 MED ORDER — PROPOFOL 10 MG/ML IV BOLUS
INTRAVENOUS | Status: AC
  Filled 2021-10-07: qty 20

## 2021-10-07 MED ORDER — THROMBIN 5000 UNITS EX SOLR
CUTANEOUS | Status: DC | PRN
  Administered 2021-10-07 (×2): 5000 [IU] via TOPICAL

## 2021-10-07 MED ORDER — VANCOMYCIN HCL 1 G IV SOLR
INTRAVENOUS | Status: DC | PRN
  Administered 2021-10-07: 1000 mg

## 2021-10-07 MED ORDER — ONDANSETRON HCL 4 MG/2ML IJ SOLN: 4.0000 mg | Freq: Four times a day (QID) | INTRAMUSCULAR | Status: DC | PRN

## 2021-10-07 MED ORDER — HYDROCHLOROTHIAZIDE 12.5 MG PO TABS
12.5000 mg | ORAL_TABLET | Freq: Every day | ORAL | Status: DC
  Administered 2021-10-07 – 2021-10-08 (×2): 12.5 mg via ORAL
  Filled 2021-10-07 (×2): qty 1

## 2021-10-07 MED ORDER — LABETALOL HCL 5 MG/ML IV SOLN
INTRAVENOUS | Status: DC | PRN
  Administered 2021-10-07 (×2): 5 mg via INTRAVENOUS

## 2021-10-07 MED ORDER — PHENYLEPHRINE 80 MCG/ML (10ML) SYRINGE FOR IV PUSH (FOR BLOOD PRESSURE SUPPORT)
PREFILLED_SYRINGE | INTRAVENOUS | Status: AC
  Filled 2021-10-07: qty 10

## 2021-10-07 MED ORDER — SUCCINYLCHOLINE CHLORIDE 200 MG/10ML IV SOSY
PREFILLED_SYRINGE | INTRAVENOUS | Status: AC
  Filled 2021-10-07: qty 10

## 2021-10-07 MED ORDER — MIDAZOLAM HCL 5 MG/5ML IJ SOLN
INTRAMUSCULAR | Status: DC | PRN
  Administered 2021-10-07: 2 mg via INTRAVENOUS

## 2021-10-07 MED ORDER — NITROGLYCERIN 0.4 MG SL SUBL: 0.4000 mg | SUBLINGUAL_TABLET | SUBLINGUAL | Status: DC | PRN

## 2021-10-07 MED ORDER — MORPHINE SULFATE ER 15 MG PO TBCR
30.0000 mg | EXTENDED_RELEASE_TABLET | Freq: Two times a day (BID) | ORAL | Status: DC
  Administered 2021-10-07 – 2021-10-08 (×2): 30 mg via ORAL
  Filled 2021-10-07 (×2): qty 2

## 2021-10-07 MED ORDER — CHLORHEXIDINE GLUCONATE 0.12 % MT SOLN
15.0000 mL | Freq: Once | OROMUCOSAL | Status: AC
  Administered 2021-10-07: 15 mL via OROMUCOSAL
  Filled 2021-10-07: qty 15

## 2021-10-07 MED ORDER — ONDANSETRON HCL 4 MG/2ML IJ SOLN: 4.0000 mg | Freq: Once | INTRAMUSCULAR | Status: DC | PRN

## 2021-10-07 MED ORDER — PREGABALIN 75 MG PO CAPS
75.0000 mg | ORAL_CAPSULE | Freq: Every day | ORAL | Status: DC
  Administered 2021-10-08: 75 mg via ORAL
  Filled 2021-10-07: qty 1

## 2021-10-07 MED ORDER — ONDANSETRON HCL 4 MG PO TABS: 4.0000 mg | ORAL_TABLET | Freq: Four times a day (QID) | ORAL | Status: DC | PRN

## 2021-10-07 MED ORDER — POLYETHYLENE GLYCOL 3350 17 G PO PACK
17.0000 g | PACK | Freq: Every day | ORAL | Status: DC
  Administered 2021-10-07 – 2021-10-08 (×2): 17 g via ORAL
  Filled 2021-10-07 (×2): qty 1

## 2021-10-07 MED ORDER — MORPHINE SULFATE (PF) 2 MG/ML IV SOLN
2.0000 mg | INTRAVENOUS | Status: DC | PRN
  Administered 2021-10-07: 2 mg via INTRAVENOUS
  Filled 2021-10-07: qty 1

## 2021-10-07 MED ORDER — CEFAZOLIN IN SODIUM CHLORIDE 3-0.9 GM/100ML-% IV SOLN
2.0000 g | INTRAVENOUS | Status: DC
  Filled 2021-10-07: qty 100

## 2021-10-07 MED ORDER — KETOROLAC TROMETHAMINE 30 MG/ML IJ SOLN
30.0000 mg | Freq: Once | INTRAMUSCULAR | Status: AC
  Administered 2021-10-07: 30 mg via INTRAVENOUS

## 2021-10-07 MED ORDER — FENTANYL CITRATE (PF) 250 MCG/5ML IJ SOLN
INTRAMUSCULAR | Status: DC | PRN
  Administered 2021-10-07 (×3): 50 ug via INTRAVENOUS
  Administered 2021-10-07: 100 ug via INTRAVENOUS

## 2021-10-07 MED ORDER — PROPOFOL 10 MG/ML IV BOLUS
INTRAVENOUS | Status: DC | PRN
Start: 2021-10-07 — End: 2021-10-07
  Administered 2021-10-07: 200 mg via INTRAVENOUS

## 2021-10-07 MED ORDER — OXYCODONE HCL 5 MG PO TABS
15.0000 mg | ORAL_TABLET | ORAL | Status: DC | PRN
  Administered 2021-10-07 – 2021-10-08 (×4): 15 mg via ORAL
  Filled 2021-10-07 (×4): qty 3

## 2021-10-07 MED ORDER — KETAMINE HCL 50 MG/5ML IJ SOSY
PREFILLED_SYRINGE | INTRAMUSCULAR | Status: AC
  Filled 2021-10-07: qty 5

## 2021-10-07 MED ORDER — HEMOSTATIC AGENTS (NO CHARGE) OPTIME
TOPICAL | Status: DC | PRN
  Administered 2021-10-07: 1 via TOPICAL

## 2021-10-07 MED ORDER — OXYCODONE HCL 5 MG PO TABS: 5.0000 mg | ORAL_TABLET | Freq: Once | ORAL | Status: DC | PRN

## 2021-10-07 MED ORDER — ACETAMINOPHEN 10 MG/ML IV SOLN
INTRAVENOUS | Status: AC
  Filled 2021-10-07: qty 100

## 2021-10-07 MED ORDER — PREGABALIN 75 MG PO CAPS
150.0000 mg | ORAL_CAPSULE | Freq: Two times a day (BID) | ORAL | Status: DC
  Administered 2021-10-07: 150 mg via ORAL
  Filled 2021-10-07: qty 2

## 2021-10-07 MED ORDER — KETOROLAC TROMETHAMINE 30 MG/ML IJ SOLN
INTRAMUSCULAR | Status: AC
  Filled 2021-10-07: qty 1

## 2021-10-07 MED ORDER — CHLORHEXIDINE GLUCONATE CLOTH 2 % EX PADS: 6.0000 | MEDICATED_PAD | Freq: Once | CUTANEOUS | Status: DC

## 2021-10-07 MED ORDER — BUPIVACAINE HCL (PF) 0.25 % IJ SOLN
INTRAMUSCULAR | Status: AC
  Filled 2021-10-07: qty 30

## 2021-10-07 MED ORDER — POTASSIUM CHLORIDE IN NACL 20-0.9 MEQ/L-% IV SOLN: INTRAVENOUS | Status: DC

## 2021-10-07 MED ORDER — ACETAMINOPHEN 500 MG PO TABS
1000.0000 mg | ORAL_TABLET | Freq: Four times a day (QID) | ORAL | Status: DC
  Administered 2021-10-07 – 2021-10-08 (×3): 1000 mg via ORAL
  Filled 2021-10-07 (×3): qty 2

## 2021-10-07 MED ORDER — OXYCODONE HCL 5 MG PO TABS
10.0000 mg | ORAL_TABLET | Freq: Once | ORAL | Status: AC
  Administered 2021-10-07: 10 mg via ORAL

## 2021-10-07 MED ORDER — SENNA 8.6 MG PO TABS
1.0000 | ORAL_TABLET | Freq: Two times a day (BID) | ORAL | Status: DC
  Administered 2021-10-07 – 2021-10-08 (×2): 8.6 mg via ORAL
  Filled 2021-10-07 (×2): qty 1

## 2021-10-07 MED ORDER — DEXAMETHASONE 4 MG PO TABS
4.0000 mg | ORAL_TABLET | Freq: Four times a day (QID) | ORAL | Status: DC
  Administered 2021-10-07: 4 mg via ORAL
  Filled 2021-10-07: qty 1

## 2021-10-07 MED ORDER — FENTANYL CITRATE (PF) 250 MCG/5ML IJ SOLN
INTRAMUSCULAR | Status: AC
  Filled 2021-10-07: qty 5

## 2021-10-07 MED ORDER — THROMBIN 5000 UNITS EX SOLR
OROMUCOSAL | Status: DC | PRN
  Administered 2021-10-07: 5 mL via TOPICAL

## 2021-10-07 MED ORDER — VANCOMYCIN HCL 1000 MG IV SOLR
INTRAVENOUS | Status: AC
  Filled 2021-10-07: qty 20

## 2021-10-07 MED ORDER — EPHEDRINE 5 MG/ML INJ
INTRAVENOUS | Status: AC
  Filled 2021-10-07: qty 5

## 2021-10-07 MED ORDER — BUPIVACAINE HCL (PF) 0.25 % IJ SOLN
INTRAMUSCULAR | Status: DC | PRN
  Administered 2021-10-07: 5 mL

## 2021-10-07 MED ORDER — SUCCINYLCHOLINE CHLORIDE 200 MG/10ML IV SOSY
PREFILLED_SYRINGE | INTRAVENOUS | Status: DC | PRN
  Administered 2021-10-07: 140 mg via INTRAVENOUS

## 2021-10-07 MED ORDER — PHENYLEPHRINE HCL-NACL 20-0.9 MG/250ML-% IV SOLN
INTRAVENOUS | Status: DC | PRN
  Administered 2021-10-07: 15 ug/min via INTRAVENOUS

## 2021-10-07 MED ORDER — ROCURONIUM BROMIDE 10 MG/ML (PF) SYRINGE
PREFILLED_SYRINGE | INTRAVENOUS | Status: DC | PRN
  Administered 2021-10-07: 10 mg via INTRAVENOUS
  Administered 2021-10-07: 50 mg via INTRAVENOUS

## 2021-10-07 MED ORDER — LIDOCAINE 2% (20 MG/ML) 5 ML SYRINGE
INTRAMUSCULAR | Status: AC
  Filled 2021-10-07: qty 10

## 2021-10-07 MED ORDER — SODIUM CHLORIDE 0.9% FLUSH
3.0000 mL | Freq: Two times a day (BID) | INTRAVENOUS | Status: DC
  Administered 2021-10-07: 3 mL via INTRAVENOUS

## 2021-10-07 MED ORDER — MENTHOL 3 MG MT LOZG: 1.0000 | LOZENGE | OROMUCOSAL | Status: DC | PRN

## 2021-10-07 MED ORDER — ORAL CARE MOUTH RINSE: 15.0000 mL | Freq: Once | OROMUCOSAL | Status: AC

## 2021-10-07 MED ORDER — ROCURONIUM BROMIDE 10 MG/ML (PF) SYRINGE
PREFILLED_SYRINGE | INTRAVENOUS | Status: AC
  Filled 2021-10-07: qty 20

## 2021-10-07 MED ORDER — 0.9 % SODIUM CHLORIDE (POUR BTL) OPTIME
TOPICAL | Status: DC | PRN
  Administered 2021-10-07: 1000 mL

## 2021-10-07 MED ORDER — CEFAZOLIN SODIUM-DEXTROSE 2-3 GM-%(50ML) IV SOLR
INTRAVENOUS | Status: DC | PRN
Start: 2021-10-07 — End: 2021-10-07
  Administered 2021-10-07: 3 g via INTRAVENOUS

## 2021-10-07 MED ORDER — CELECOXIB 200 MG PO CAPS
200.0000 mg | ORAL_CAPSULE | Freq: Two times a day (BID) | ORAL | Status: DC
  Administered 2021-10-07 – 2021-10-08 (×2): 200 mg via ORAL
  Filled 2021-10-07 (×2): qty 1

## 2021-10-07 MED ORDER — HYDROMORPHONE HCL 1 MG/ML IJ SOLN
0.2500 mg | INTRAMUSCULAR | Status: AC | PRN
  Administered 2021-10-07 (×4): 0.5 mg via INTRAVENOUS

## 2021-10-07 MED ORDER — ONDANSETRON HCL 4 MG/2ML IJ SOLN
INTRAMUSCULAR | Status: DC | PRN
Start: 2021-10-07 — End: 2021-10-07
  Administered 2021-10-07: 4 mg via INTRAVENOUS

## 2021-10-07 MED ORDER — ALBUMIN HUMAN 5 % IV SOLN: INTRAVENOUS | Status: DC | PRN

## 2021-10-07 MED ORDER — HYDROMORPHONE HCL 1 MG/ML IJ SOLN
INTRAMUSCULAR | Status: DC | PRN
  Administered 2021-10-07: .5 mg via INTRAVENOUS

## 2021-10-07 MED ORDER — DEXAMETHASONE SODIUM PHOSPHATE 4 MG/ML IJ SOLN
4.0000 mg | Freq: Four times a day (QID) | INTRAMUSCULAR | Status: DC
  Administered 2021-10-07 – 2021-10-08 (×2): 4 mg via INTRAVENOUS
  Filled 2021-10-07 (×2): qty 1

## 2021-10-07 MED ORDER — CEFAZOLIN SODIUM-DEXTROSE 1-4 GM/50ML-% IV SOLN
1.0000 g | Freq: Three times a day (TID) | INTRAVENOUS | Status: AC
  Administered 2021-10-07 – 2021-10-08 (×2): 1 g via INTRAVENOUS
  Filled 2021-10-07 (×2): qty 50

## 2021-10-07 MED ORDER — HYDROMORPHONE HCL 1 MG/ML IJ SOLN
INTRAMUSCULAR | Status: AC
  Filled 2021-10-07: qty 0.5

## 2021-10-07 MED ORDER — ONDANSETRON HCL 4 MG/2ML IJ SOLN
INTRAMUSCULAR | Status: AC
  Filled 2021-10-07: qty 4

## 2021-10-07 MED ORDER — OXYCODONE HCL 5 MG PO TABS
ORAL_TABLET | ORAL | Status: AC
  Filled 2021-10-07: qty 2

## 2021-10-07 MED ORDER — DEXAMETHASONE SODIUM PHOSPHATE 10 MG/ML IJ SOLN
INTRAMUSCULAR | Status: AC
  Filled 2021-10-07: qty 1

## 2021-10-07 MED ORDER — PREGABALIN 75 MG PO CAPS: 75.0000 mg | ORAL_CAPSULE | ORAL | Status: DC

## 2021-10-07 MED ORDER — THROMBIN 5000 UNITS EX SOLR
CUTANEOUS | Status: AC
  Filled 2021-10-07: qty 15000

## 2021-10-07 MED ORDER — MIDAZOLAM HCL 2 MG/2ML IJ SOLN
INTRAMUSCULAR | Status: AC
  Filled 2021-10-07: qty 2

## 2021-10-07 MED ORDER — LIDOCAINE 2% (20 MG/ML) 5 ML SYRINGE
INTRAMUSCULAR | Status: DC | PRN
  Administered 2021-10-07: 100 mg via INTRAVENOUS

## 2021-10-07 MED ORDER — CHLORHEXIDINE GLUCONATE CLOTH 2 % EX PADS
6.0000 | MEDICATED_PAD | Freq: Once | CUTANEOUS | Status: DC
Start: 2021-10-07 — End: 2021-10-07

## 2021-10-07 MED ORDER — HYDROMORPHONE HCL 1 MG/ML IJ SOLN
0.2500 mg | INTRAMUSCULAR | Status: DC | PRN
  Administered 2021-10-07 (×4): 0.5 mg via INTRAVENOUS

## 2021-10-07 MED ORDER — DEXAMETHASONE SODIUM PHOSPHATE 10 MG/ML IJ SOLN
INTRAMUSCULAR | Status: DC | PRN
  Administered 2021-10-07: 10 mg via INTRAVENOUS

## 2021-10-07 MED ORDER — ACETAMINOPHEN 10 MG/ML IV SOLN
INTRAVENOUS | Status: DC | PRN
Start: 2021-10-07 — End: 2021-10-07
  Administered 2021-10-07: 1000 mg via INTRAVENOUS

## 2021-10-07 MED ORDER — FAMOTIDINE 20 MG PO TABS
20.0000 mg | ORAL_TABLET | Freq: Every day | ORAL | Status: DC
Start: 2021-10-08 — End: 2021-10-08
  Administered 2021-10-08: 20 mg via ORAL
  Filled 2021-10-07: qty 1

## 2021-10-07 MED ORDER — LISINOPRIL-HYDROCHLOROTHIAZIDE 10-12.5 MG PO TABS: 1.0000 | ORAL_TABLET | Freq: Every day | ORAL | Status: DC

## 2021-10-07 SURGICAL SUPPLY — 64 items
ADH SKN CLS APL DERMABOND .7 (GAUZE/BANDAGES/DRESSINGS) ×1
APL SKNCLS STERI-STRIP NONHPOA (GAUZE/BANDAGES/DRESSINGS) ×1
BAG COUNTER SPONGE SURGICOUNT (BAG) ×3 IMPLANT
BAG SPNG CNTER NS LX DISP (BAG) ×1
BAND INSRT 18 STRL LF DISP RB (MISCELLANEOUS) ×2
BAND RUBBER #18 3X1/16 STRL (MISCELLANEOUS) ×6 IMPLANT
BENZOIN TINCTURE PRP APPL 2/3 (GAUZE/BANDAGES/DRESSINGS) ×3 IMPLANT
BUR CARBIDE MATCH 3.0 (BURR) ×3 IMPLANT
CANISTER SUCT 3000ML PPV (MISCELLANEOUS) ×3 IMPLANT
CONTROL REMOTE FREELINK ALPHA (NEUROSURGERY SUPPLIES) ×1 IMPLANT
DERMABOND ADVANCED (GAUZE/BANDAGES/DRESSINGS) ×1
DERMABOND ADVANCED .7 DNX12 (GAUZE/BANDAGES/DRESSINGS) IMPLANT
DRAPE C-ARM 42X72 X-RAY (DRAPES) ×6 IMPLANT
DRAPE LAPAROTOMY 100X72X124 (DRAPES) ×3 IMPLANT
DRAPE SURG 17X23 STRL (DRAPES) ×3 IMPLANT
DRSG OPSITE POSTOP 4X6 (GAUZE/BANDAGES/DRESSINGS) ×2 IMPLANT
DURAPREP 26ML APPLICATOR (WOUND CARE) ×3 IMPLANT
ELECT REM PT RETURN 9FT ADLT (ELECTROSURGICAL) ×2
ELECTRODE REM PT RTRN 9FT ADLT (ELECTROSURGICAL) ×2 IMPLANT
ELEVATER PASSER (SPINAL CORD STIMULATOR) ×2
GAUZE 4X4 16PLY ~~LOC~~+RFID DBL (SPONGE) IMPLANT
GLOVE BIO SURGEON STRL SZ7 (GLOVE) IMPLANT
GLOVE BIO SURGEON STRL SZ8 (GLOVE) ×2 IMPLANT
GLOVE BIOGEL PI IND STRL 7.0 (GLOVE) IMPLANT
GLOVE BIOGEL PI INDICATOR 7.0 (GLOVE)
GLOVE SURG SS PI 6.5 STRL IVOR (GLOVE) ×3 IMPLANT
GLOVE SURG SS PI 7.0 STRL IVOR (GLOVE) ×1 IMPLANT
GLOVE SURG SS PI 8.0 STRL IVOR (GLOVE) ×1 IMPLANT
GOWN STRL REUS W/ TWL LRG LVL3 (GOWN DISPOSABLE) IMPLANT
GOWN STRL REUS W/ TWL XL LVL3 (GOWN DISPOSABLE) ×2 IMPLANT
GOWN STRL REUS W/TWL 2XL LVL3 (GOWN DISPOSABLE) IMPLANT
GOWN STRL REUS W/TWL LRG LVL3 (GOWN DISPOSABLE)
GOWN STRL REUS W/TWL XL LVL3 (GOWN DISPOSABLE) ×4
HEMOSTAT POWDER KIT SURGIFOAM (HEMOSTASIS) ×1 IMPLANT
KIT BASIN OR (CUSTOM PROCEDURE TRAY) ×3 IMPLANT
KIT CHARGING (KITS) ×1
KIT CHARGING PRECISION NEURO (KITS) IMPLANT
KIT IPG ALPHA WAVEWRITER (Stimulator) ×1 IMPLANT
KIT TURNOVER KIT B (KITS) ×3 IMPLANT
LEAD COVER EDGE 50CM STIM KIT (Lead) ×1 IMPLANT
NDL HYPO 25X1 1.5 SAFETY (NEEDLE) ×2 IMPLANT
NDL SPNL 20GX3.5 QUINCKE YW (NEEDLE) IMPLANT
NEEDLE HYPO 25X1 1.5 SAFETY (NEEDLE) ×2 IMPLANT
NEEDLE SPNL 20GX3.5 QUINCKE YW (NEEDLE) IMPLANT
NS IRRIG 1000ML POUR BTL (IV SOLUTION) ×3 IMPLANT
PACK LAMINECTOMY NEURO (CUSTOM PROCEDURE TRAY) ×3 IMPLANT
PAD ARMBOARD 7.5X6 YLW CONV (MISCELLANEOUS) ×9 IMPLANT
PASSER ELEVATOR (SPINAL CORD STIMULATOR) IMPLANT
SPONGE SURGIFOAM ABS GEL SZ50 (HEMOSTASIS) ×3 IMPLANT
STAPLER SKIN 35 WIDE (STAPLE) ×1 IMPLANT
STAPLER SKIN PROX WIDE 3.9 (STAPLE) ×1 IMPLANT
STRIP CLOSURE SKIN 1/2X4 (GAUZE/BANDAGES/DRESSINGS) ×4 IMPLANT
SUT SILK 0 (SUTURE)
SUT SILK 0 MO-6 18XCR BRD 8 (SUTURE) IMPLANT
SUT SILK 2 0 PERMA HAND 18 BK (SUTURE) ×3 IMPLANT
SUT SILK 3 0 SH 30 (SUTURE) ×4 IMPLANT
SUT VIC AB 0 CT1 18XCR BRD8 (SUTURE) ×2 IMPLANT
SUT VIC AB 0 CT1 8-18 (SUTURE) ×2
SUT VIC AB 2-0 CP2 18 (SUTURE) ×4 IMPLANT
SUT VIC AB 3-0 SH 8-18 (SUTURE) ×3 IMPLANT
TOOL LONG TUNNEL (SPINAL CORD STIMULATOR) ×1 IMPLANT
TOWEL GREEN STERILE (TOWEL DISPOSABLE) ×3 IMPLANT
TOWEL GREEN STERILE FF (TOWEL DISPOSABLE) ×3 IMPLANT
WATER STERILE IRR 1000ML POUR (IV SOLUTION) ×3 IMPLANT

## 2021-10-07 NOTE — Op Note (Signed)
10/07/2021 ? ?2:25 PM ? ?PATIENT:  Darin Gomez  71 y.o. male ? ?PRE-OPERATIVE DIAGNOSIS: Failed back syndrome ? ?POST-OPERATIVE DIAGNOSIS:  same ? ?PROCEDURE: Insertion of spinal cord stimulator via T10 laminectomy ? ?SURGEON:  Sherley Bounds, MD ? ?ASSISTANTS: Glenford Peers FNP ? ?ANESTHESIA:   General ? ?EBL: 150 ml ? ?Total I/O ?In: 425 [IV Piggyback:425] ?Out: 150 [Blood:150] ? ?BLOOD ADMINISTERED: none ? ?DRAINS: None ? ?SPECIMEN:  none ? ?INDICATION FOR PROCEDURE: This patient presented with failed back syndrome.  Previously placed spinal cord stimulator was removed in order to accomplish a recent lumbar fusion surgery he presents today for replacement of the stimulator.. The patient tried conservative measures without relief. Pain was debilitating. Patient understood the risks, benefits, and alternatives and potential outcomes and wished to proceed. ? ?Findings at surgery: We could not get the paddle lead to pass into the midline across the T9 vertebral body.  It would continue to pass to the right or to the left.  After several attempts we exposed T9-10 and performed a T9 laminectomy.  We remove the spinous process and performed a laminectomy at this level and continued to widen this and extended superiorly until we could finally get the paddle lead to pass into the midline across the T9 vertebral body into good position.  This was extremely difficult and added at least an extra hour to the surgery and added complexity that is not typical for placement of a spinal cord stimulator via laminectomy. ? ?PROCEDURE DETAILS: The patient was taken to the operating room and after induction of adequate generalized endotracheal anesthesia he was rolled into the prone position on chest rolls and all pressure points were padded.  His thoracic and lumbar region was cleaned with Betadine scrub and then prepped with DuraPrep after our incisions were marked utilizing AP fluoroscopy.  He was then draped in the usual sterile  fashion.  A timeout was performed.  We injected local anesthesia at both incisions.  We opened his old battery pocket site and created a larger pocket for the new battery.  We packed saline soaked sponges here.  We then opened our thoracic incision and took down the paraspinous musculature and some periosteal fashion to expose T9-10 and T10-11.  We planned on doing a T10 laminectomy and having the paddle lead crossed T9 to the T 8 9 disc space.  This was the best position for him with his previous spinal cord stimulator.  We got AP fluoroscopy to mark our level and then remove the spinous process and performed a laminectomy and medial facetectomy at T10-11.  We passed our white passer and then tried several attempts at passing our paddle lead but it continued to pass to the right or to the left once it got to the mid body of T9.  Finally we decided to open further and perform a laminectomy at T9 in order to help guide the lead.  We removed the spinous process and used a high-speed drill and the Kerrison punches to perform a laminectomy and medial facetectomy at T9-10.  We then again tried several passes with the white passer and with the spinal cord stimulator lead but again it would not pass the mid T9 body.  Therefore we widened our laminectomy and undercut further the lamina of T9.  Finally we were able to pass the 32-contact lead to the T8-9 disc base in the midline and felt like we had excellent position of our lead.  We tied it down to  the spinous process below and then tied it to the fascia.  We used a shunt passer to pass the leads from the thoracic incision to the battery site.  We placed the leads in the battery and checked impedances.  All were green and therefore we locked the leads into the new pulse generator.  We placed the pulse generator into the battery pocket with the writing side up.  We then irrigated with saline solution.  We dried all bleeding points.  We placed powdered vancomycin into the  battery site.  We closed the battery site with 2-0 Vicryl in the subcutaneous tissues and 3-0 Vicryl in the subcuticular tissues and staples in the skin.  We closed the thoracic incision with 0 Vicryl in the fascia, 2-0 Vicryl in the subcutaneous tissues and 3-0 Vicryl in the subcu tissue.  Skin was closed with benzoin and Steri-Strips.  The drapes were removed.  A sterile dressing was applied over both incisions.  The patient was awakened from general anesthesia and transported to the recovery room in stable condition.  At the end of the procedure all sponge needle and instrument counts were correct. ? ? ?PLAN OF CARE: Admit for overnight observation ? ?PATIENT DISPOSITION:  PACU - hemodynamically stable. ?  ?Delay start of Pharmacological VTE agent (>24hrs) due to surgical blood loss or risk of bleeding:  yes ? ? ? ? ? ? ? ? ? ? ? ? ? ? ?

## 2021-10-07 NOTE — H&P (Signed)
Subjective: ?Patient is a 71 y.o. male admitted for spinal cord stimulator replacement.  Onset of symptoms was several years ago, unchanged since that time.  The pain is rated severe, and is located at the across the lower back and radiates to legs. The pain is described as aching and occurs all day. The symptoms have been progressive. Symptoms are exacerbated by standing and walking for more than a few minutes.  His existing spinal cord stimulator had to be removed to accomplish his most recent lumbar spinal fusion surgery, and he presents today to replace the spinal cord stimulator ? ?Past Medical History:  ?Diagnosis Date  ? Allergy   ? Arthritis   ? mainly in back  ? Back pain   ? Balance disorder   ? uses cane daily  ? Blood in urine   ? Chest pain   ? Constipation   ? Diverticulosis 02/14/2018  ? noted on colonoscopy  ? Environmental allergies   ? Foot swelling   ? Bilateral  ? GERD (gastroesophageal reflux disease)   ? Headache(784.0)   ? occas.  migraines  (from last surgery)  ? Heart murmur   ? as child....Marland Kitchenno problems as an adult  ? History of colon polyps 02/14/2018  ? Hyperlipidemia   ? Hypertension   ? dx  10 yrs or more  ago  ? Insomnia   ? Neck pain   ? Neuromuscular disorder ([place])   ? bilateral feet  ? Night sweats   ? Painful swelling of joint   ? PONV (postoperative nausea and vomiting)   ? Poor circulation of extremity   ? legs  ? Shortness of breath   ? with exertion  ? Sinus disorder   ? Spinal headache   ? Vertigo   ?  ?Past Surgical History:  ?Procedure Laterality Date  ? ABDOMINAL EXPOSURE N/A 09/14/2019  ? Procedure: ABDOMINAL EXPOSURE;  Surgeon: [name], MD;  Location: [facility];  Service: Vascular;  Laterality: N/A;  ? ANTERIOR LAT LUMBAR FUSION Right 09/14/2019  ? Procedure: Right Lumbar three-four Lumbar four-five  Anterolateral lumbar interbody fusion;  Surgeon: Erline Levine, MD;  Location: [facility];  Service: Neurosurgery;  Laterality: Right;  ? ANTERIOR LUMBAR FUSION N/A  09/14/2019  ? Procedure: Lumbar five Sacral one Anterior lumbar interbody fusion;  Surgeon: Erline Levine, MD;  Location: [facility];  Service: Neurosurgery;  Laterality: N/A;  ? BACK SURGERY    ? CARDIAC CATHETERIZATION    ? 03/04/14  ? COLONOSCOPY    ? FRACTURE SURGERY    ? left arm  ? KNEE SURGERY Right 01/02/2018  ? torn minicus  ? LEFT HEART CATHETERIZATION WITH CORONARY ANGIOGRAM N/A 03/04/2014  ? Procedure: LEFT HEART CATHETERIZATION WITH CORONARY ANGIOGRAM;  Surgeon: Peter M Martinique, MD;  Location: [facility] CATH LAB;  Service: Cardiovascular;  Laterality: N/A;  ? LUMBAR LAMINECTOMY/DECOMPRESSION MICRODISCECTOMY  03/15/2012  ? Procedure: LUMBAR LAMINECTOMY/DECOMPRESSION MICRODISCECTOMY 2 LEVELS;  Surgeon: Floyce Stakes, MD;  Location: [facility]URO [facility];  Service: Neurosurgery;  Laterality: Bilateral;  Bilateral Lumbar three, lumbar four, lumbar five Laminectomy  ? LUMBAR PERCUTANEOUS PEDICLE SCREW 3 LEVEL N/A 09/14/2019  ? Procedure: Lumbar three to Sacral one posterior pedicle screw fixation;  Surgeon: Erline Levine, MD;  Location: [facility];  Service: Neurosurgery;  Laterality: N/A;  ? POLYPECTOMY    ? SPINAL CORD STIMULATOR BATTERY EXCHANGE N/A 09/14/2021  ? Procedure: Removal of spinal cord stimulator leads;  Surgeon: [name], MD;  Location: [facility];  Service:  Neurosurgery;  Laterality: N/A;  ? SPINAL CORD STIMULATOR INSERTION N/A 05/29/2014  ? Procedure: LUMBAR SPINAL CORD STIMULATOR INSERTION;  Surgeon: Bonna Gains, MD;  Location: [facility];  Service: Neurosurgery;  Laterality: N/A;  ? TOTAL KNEE ARTHROPLASTY Right 02/08/2019  ? Procedure: TOTAL KNEE ARTHROPLASTY;  Surgeon: Paralee Cancel, MD;  Location: [facility];  Service: Orthopedics;  Laterality: Right;  70 mins  ?  ?Prior to Admission medications   ?Medication Sig Start Date End Date Taking? Authorizing Provider  ?acetaminophen (TYLENOL) 500 MG tablet Take 2 tablets (1,000 mg total) by mouth every 8 (eight) hours. 02/09/19  Yes Danae Orleans, PA-C  ?aspirin 81 MG  chewable tablet Chew 81 mg by mouth at bedtime.   Yes [provider]  ?cetirizine (ZYRTEC) 10 MG tablet Take 10 mg by mouth daily.    Yes [provider]  ?docusate sodium (COLACE) 100 MG capsule Take 1 capsule (100 mg total) by mouth 2 (two) times daily. ?Patient taking differently: Take 100 mg by mouth daily. 02/09/19  Yes Babish, Rodman Key, PA-C  ?famotidine (PEPCID) 20 MG tablet Take 20 mg by mouth daily.   Yes [provider]  ?hydrOXYzine (ATARAX) 25 MG tablet Take 25 mg by mouth at bedtime.   Yes [provider]  ?lisinopril-hydrochlorothiazide (ZESTORETIC) 10-12.5 MG tablet Take 1 tablet by mouth daily. 01/27/21  Yes Minna Merritts, MD  ?methocarbamol (ROBAXIN) 750 MG tablet Take 750 mg by mouth 3 (three) times daily. 11/20/20  Yes [provider]  ?morphine (MS CONTIN) 30 MG 12 hr tablet Take 30 mg by mouth every 12 (twelve) hours.   Yes [provider]  ?nystatin cream (MYCOSTATIN) Apply 1 application. topically daily as needed (yeast).   Yes [provider]  ?oxyCODONE (ROXICODONE) 15 MG immediate release tablet Take 15 mg by mouth every 4 (four) hours as needed for pain.   Yes [provider]  ?polyethylene glycol (MIRALAX / GLYCOLAX) 17 g packet Take 17 g by mouth 2 (two) times daily. ?Patient taking differently: Take 17 g by mouth daily. 02/09/19  Yes Babish, Rodman Key, PA-C  ?pravastatin (PRAVACHOL) 80 MG tablet Take 1 tablet (80 mg total) by mouth daily. 01/27/21  Yes Minna Merritts, MD  ?pregabalin (LYRICA) 75 MG capsule TAKE ONE CAPSULE BY MOUTH THREE TIMES DAILY  ?Patient taking differently: Take 75-150 mg by mouth See admin instructions. Take 75 mg by mouth in the morning and 150 mg in the afternoon and bedtime 12/13/18  Yes Gillis Santa, MD  ?testosterone cypionate (DEPOTESTOSTERONE CYPIONATE) 200 MG/ML injection Inject 200 mg into the muscle every 14 (fourteen) days. 08/21/19  Yes [provider]  ?Vitamin D,  Ergocalciferol, (DRISDOL) 50000 UNITS CAPS capsule Take 50,000 Units by mouth 2 (two) times a week.    Yes [provider]  ?zolpidem (AMBIEN) 10 MG tablet Take 10 mg by mouth at bedtime.   Yes [provider]  ?nitroGLYCERIN (NITROSTAT) 0.4 MG SL tablet Place 1 tablet (0.4 mg total) under the tongue every 5 (five) minutes as needed for chest pain. 01/27/21   Minna Merritts, MD  ?sildenafil (VIAGRA) 100 MG tablet Take 100 mg by mouth daily as needed for erectile dysfunction.    [provider]  ? ?Allergies  ?Allergen Reactions  ? Gabapentin Swelling  ? Tape   ?  Plastic tape: Paper tape ONLY! ?  ? Keppra [Levetiracetam] Nausea And Vomiting  ? Valium [Diazepam] Other (See Comments)  ?  Significant depression; "a  different person"  ?  ?Social History  ? ?Tobacco Use  ? Smoking status: Former  ?  Years: 45.00  ?  Types: Cigarettes  ?  Passive exposure: Past  ? Smokeless tobacco: Former  ?  Types: Chew  ?  Quit date: 2013  ? Tobacco comments:  ?  quit smoking cigarettes > 30 years ago  ?Substance Use Topics  ? Alcohol use: Yes  ?  Alcohol/week: 2.0 - 6.0 standard drinks  ?  Types: 2 - 3 Shots of liquor per week  ?  ?Family History  ?Problem Relation Age of Onset  ? Heart attack Mother   ? Hypertension Mother   ? Hyperlipidemia Mother   ? Diabetes Mother   ? Cancer Father   ? Heart attack Brother 65  ? Hyperlipidemia Brother   ? Hypertension Brother   ? Heart disease Brother   ? Breast cancer Neg Hx   ? Colon polyps Neg Hx   ? Colon cancer Neg Hx   ? Esophageal cancer Neg Hx   ? Liver cancer Neg Hx   ? Ovarian cancer Neg Hx   ? Pancreatic cancer Neg Hx   ? Prostate cancer Neg Hx   ? Rectal cancer Neg Hx   ? Stomach cancer Neg Hx   ? ?  ?Review of Systems ? ?Positive ROS: neg ? ?All other systems have been reviewed and were otherwise negative with the exception of those mentioned in the HPI and as above. ? ?Objective: ?Vital signs in last 24 hours: ?Temp:  [98.9 ?F (37.2 ?C)] 98.9 ?F (37.2  ?C) (05/10 0919) ?Pulse Rate:  [66] 66 (05/10 0919) ?Resp:  [18] 18 (05/10 0919) ?BP: (162)/(80) 162/80 (05/10 0919) ?SpO2:  [96 %] 96 % (05/10 0919) ?Weight:  [129.3 kg] 129.3 kg (05/10 0919) ? ?General Appearance: Al

## 2021-10-07 NOTE — Transfer of Care (Signed)
Immediate Anesthesia Transfer of Care Note ? ?Patient: Darin Gomez ? ?Procedure(s) Performed: Spinal cord stimulator via Thoracic nine laminectomy (Back) ? ?Patient Location: PACU ? ?Anesthesia Type:General ? ?Level of Consciousness: awake and drowsy ? ?Airway & Oxygen Therapy: Patient Spontanous Breathing and Patient connected to face mask oxygen ? ?Post-op Assessment: Report given to RN and Post -op Vital signs reviewed and stable ? ?Post vital signs: Reviewed and stable ? ?Last Vitals:  ?Vitals Value Taken Time  ?BP 182/81 10/07/21 1445  ?Temp 36.8 ?C 10/07/21 1445  ?Pulse 63 10/07/21 1446  ?Resp 11 10/07/21 1446  ?SpO2 99 % 10/07/21 1446  ?Vitals shown include unvalidated device data. ? ?Last Pain:  ?Vitals:  ? 10/07/21 0931  ?TempSrc:   ?PainSc: 6   ?   ? ?Patients Stated Pain Goal: 0 (10/07/21 0931) ? ?Complications: No notable events documented. ?

## 2021-10-07 NOTE — Anesthesia Postprocedure Evaluation (Signed)
Anesthesia Post Note ? ?Patient: Darin Gomez ? ?Procedure(s) Performed: Spinal cord stimulator via Thoracic nine laminectomy (Back) ? ?  ? ?Patient location during evaluation: PACU ?Anesthesia Type: General ?Level of consciousness: awake and alert ?Pain management: pain level controlled ?Vital Signs Assessment: post-procedure vital signs reviewed and stable ?Respiratory status: spontaneous breathing, nonlabored ventilation, respiratory function stable and patient connected to nasal cannula oxygen ?Cardiovascular status: blood pressure returned to baseline and stable ?Postop Assessment: no apparent nausea or vomiting ?Anesthetic complications: no ? ? ?No notable events documented. ? ?Last Vitals:  ?Vitals:  ? 10/07/21 1445 10/07/21 1500  ?BP: (!) 182/81 (!) 184/81  ?Pulse: 66 (!) 57  ?Resp: 10 11  ?Temp: 36.8 ?C   ?SpO2: 99% 98%  ?  ?Last Pain:  ?Vitals:  ? 10/07/21 1500  ?TempSrc:   ?PainSc: 8   ? ? ?  ?  ?  ?  ?  ?  ? ?Pradyun Ishman S ? ? ? ? ?

## 2021-10-07 NOTE — Anesthesia Procedure Notes (Signed)
Procedure Name: Intubation ?Date/Time: 10/07/2021 11:57 AM ?Performed by: Glynda Jaeger, CRNA ?Pre-anesthesia Checklist: Patient identified, Patient being monitored, Timeout performed, Emergency Drugs available and Suction available ?Patient Re-evaluated:Patient Re-evaluated prior to induction ?Oxygen Delivery Method: Circle System Utilized ?Preoxygenation: Pre-oxygenation with 100% oxygen ?Induction Type: IV induction ?Ventilation: Mask ventilation without difficulty ?Laryngoscope Size: Mac and 4 ?Grade View: Grade II ?Tube type: Oral ?Tube size: 7.5 mm ?Number of attempts: 1 ?Airway Equipment and Method: Stylet ?Placement Confirmation: ETT inserted through vocal cords under direct vision, positive ETCO2 and breath sounds checked- equal and bilateral ?Secured at: 23 cm ?Tube secured with: Tape ?Dental Injury: Teeth and Oropharynx as per pre-operative assessment  ? ? ? ? ?

## 2021-10-08 ENCOUNTER — Other Ambulatory Visit: Payer: Self-pay

## 2021-10-08 DIAGNOSIS — M961 Postlaminectomy syndrome, not elsewhere classified: Secondary | ICD-10-CM | POA: Diagnosis not present

## 2021-10-08 NOTE — Progress Notes (Signed)
Patient awaiting transport to his vehicle for discharge home; in no acute distress nor complaints of pain nor discomfort; moves all extremities well; incision on his back and right flank with honeycomb dressing and were clean, dry and intact; room was checked for all his belongings; discharge instructions concerning his medications, incision care, follow up appointment and when to call the doctor as needed were all discussed with patient by RN and he expressed understanding on the instructions given. ?

## 2021-10-08 NOTE — Plan of Care (Signed)

## 2021-10-08 NOTE — Discharge Summary (Signed)
Physician Discharge Summary  ?Patient ID: ?Darin Gomez ?MRN: [id number] ?DOB/AGE: 71/12/1950 71 y.o. ? ?Admit date: 10/07/2021 ?Discharge date: 10/08/2021 ? ?Admission Diagnoses:  Failed back syndrome ?  ? ? ?Discharge Diagnoses: same ? ? ?Discharged Condition: good ? ?Hospital Course: The patient was admitted on 10/07/2021 and taken to the operating room where the patient underwent spinal cord stimulator placement. The patient tolerated the procedure well and was taken to the recovery room and then to the floor in stable condition. The hospital course was routine. There were no complications. The wound remained clean dry and intact. Pt had appropriate back soreness. No complaints of leg pain or new N/T/W. The patient remained afebrile with stable vital signs, and tolerated a regular diet. The patient continued to increase activities, and pain was well controlled with oral pain medications.  ? ?Consults: None ? ?Significant Diagnostic Studies:  ?Results for orders placed or performed during the hospital encounter of 10/07/21  ?Surgical pcr screen  ? Specimen: Nasal Mucosa; Nasal Swab  ?Result Value Ref Range  ? MRSA, PCR NEGATIVE NEGATIVE  ? Staphylococcus aureus NEGATIVE NEGATIVE  ?Protime-INR  ?Result Value Ref Range  ? Prothrombin Time 13.3 11.4 - 15.2 seconds  ? INR 1.0 0.8 - 1.2  ?CBC per protocol  ?Result Value Ref Range  ? WBC 7.5 4.0 - 10.5 K/uL  ? RBC 4.76 4.22 - 5.81 MIL/uL  ? Hemoglobin 15.2 13.0 - 17.0 g/dL  ? HCT 43.8 39.0 - 52.0 %  ? MCV 92.0 80.0 - 100.0 fL  ? MCH 31.9 26.0 - 34.0 pg  ? MCHC 34.7 30.0 - 36.0 g/dL  ? RDW 11.9 11.5 - 15.5 %  ? Platelets 340 150 - 400 K/uL  ? nRBC 0.0 0.0 - 0.2 %  ?Basic metabolic panel per protocol  ?Result Value Ref Range  ? Sodium 135 135 - 145 mmol/L  ? Potassium 4.2 3.5 - 5.1 mmol/L  ? Chloride 100 98 - 111 mmol/L  ? CO2 27 22 - 32 mmol/L  ? Glucose, Bld 105 (H) 70 - 99 mg/dL  ? BUN 15 8 - 23 mg/dL  ? Creatinine, Ser 0.95 0.61 - 1.24 mg/dL  ? Calcium 9.0 8.9 - 10.3  mg/dL  ? GFR, Estimated >60 >60 mL/min  ? Anion gap 8 5 - 15  ? ? ?DG THORACOLUMABAR SPINE ? ?Result Date: 10/07/2021 ?CLINICAL DATA:  Insertion of spinal cord stimulator. EXAM: THORACOLUMBAR SPINE 1V Radiation exposure index: 15.329 mGy. COMPARISON:  September 14, 2021. FINDINGS: Single intraoperative fluoroscopic image demonstrates stimulator lead projected over lower thoracic spine. IMPRESSION: Fluoroscopic guidance provided during spinal cord stimulator placement. Electronically Signed   By: Marijo Conception M.D.   On: 10/07/2021 14:55  ? ?DG Lumbar Spine 2-3 Views ? ?Result Date: 09/14/2021 ?CLINICAL DATA:  LUMBAR TWO-THREE POSTERIOR LUMBAR INTERBODY FUSION WITH EXTENSION OF INSTRUMENTATION. EXAM: LUMBAR SPINE - 2-3 VIEW COMPARISON:  None. FINDINGS: Two intraoperative fluoroscopic images are provided. Posterior inter pedicular fixation hardware is shown within the lumbar spine. Hardware appears intact and appropriately positioned with intervening disc grafts/spacers. Fluoroscopy provided for 43 seconds.  40.55 mGy IMPRESSION: Intraoperative fluoroscopic images demonstrating posterior interpedicular fixation hardware within the lumbar spine. No evidence of surgical complicating feature. Electronically Signed   By: Franki Cabot M.D.   On: 09/14/2021 15:39  ? ?MR THORACIC SPINE WO CONTRAST ? ?Result Date: 09/22/2021 ?CLINICAL DATA:  Thoracic spine pain, spinal cord stimulator removed 1 week ago EXAM: MRI THORACIC SPINE WITHOUT CONTRAST TECHNIQUE: Multiplanar,  multisequence MR imaging of the thoracic spine was performed. No intravenous contrast was administered. COMPARISON:  CT T-spine 05/04/2016, no prior MRI of the thoracic spine. FINDINGS: Alignment:  Physiologic. Vertebrae: No acute fracture or suspicious osseous lesion. T1 and T2 hyperintense focus in the T7 vertebral body, consistent with a benign hemangioma. Cord:  Normal signal and morphology. Paraspinal and other soft tissues: No acute finding. Disc levels: Mild  degenerative changes without spinal canal stenosis or neural foraminal narrowing. IMPRESSION: No acute finding in the thoracic spine. No spinal canal stenosis or neural foraminal narrowing. Electronically Signed   By: Merilyn Baba M.D.   On: 09/22/2021 00:01  ? ?DG C-Arm 1-60 Min-No Report ? ?Result Date: 10/07/2021 ?Fluoroscopy was utilized by the requesting physician.  No radiographic interpretation.  ? ?DG C-Arm 1-60 Min-No Report ? ?Result Date: 10/07/2021 ?Fluoroscopy was utilized by the requesting physician.  No radiographic interpretation.  ? ?DG C-Arm 1-60 Min-No Report ? ?Result Date: 09/14/2021 ?Fluoroscopy was utilized by the requesting physician.  No radiographic interpretation.  ? ?DG C-Arm 1-60 Min-No Report ? ?Result Date: 09/14/2021 ?Fluoroscopy was utilized by the requesting physician.  No radiographic interpretation.  ? ?DG C-Arm 1-60 Min-No Report ? ?Result Date: 09/14/2021 ?Fluoroscopy was utilized by the requesting physician.  No radiographic interpretation.  ? ?DG C-Arm 1-60 Min-No Report ? ?Result Date: 09/14/2021 ?Fluoroscopy was utilized by the requesting physician.  No radiographic interpretation.   ? ?Antibiotics:  ?Anti-infectives (From admission, onward)  ? ? Start     Dose/Rate Route Frequency Ordered Stop  ? 10/07/21 1830  ceFAZolin (ANCEF) IVPB 1 g/50 mL premix       ? 1 g ?100 mL/hr over 30 Minutes Intravenous Every 8 hours 10/07/21 1730 10/08/21 0339  ? 10/07/21 1246  vancomycin (VANCOCIN) powder  Status:  Discontinued       ?   As needed 10/07/21 1247 10/07/21 1439  ? 10/07/21 0915  ceFAZolin (ANCEF) IVPB 3g/100 mL premix  Status:  Discontinued       ? 2 g ?133.3 mL/hr over 30 Minutes Intravenous On call to O.R. 10/07/21 0911 10/07/21 1729  ? ?  ? ? ?Discharge Exam: ?Blood pressure (!) 150/68, pulse 64, temperature 97.7 ?F (36.5 ?C), temperature source Oral, resp. rate 20, height '6\' 1"'$  (1.854 m), weight 129.3 kg, SpO2 94 %. ?Neurologic: Grossly normal ?Ambulating and voiding well  incision cdi  ? ?Discharge Medications:   ?Allergies as of 10/08/2021   ? ?   Reactions  ? Gabapentin Swelling  ? Tape   ? Plastic tape: Paper tape ONLY!  ? Keppra [levetiracetam] Nausea And Vomiting  ? Valium [diazepam] Other (See Comments)  ? Significant depression; "a different person"  ? ?  ? ?  ?Medication List  ?  ? ?TAKE these medications   ? ?acetaminophen 500 MG tablet ?Commonly known as: TYLENOL ?Take 2 tablets (1,000 mg total) by mouth every 8 (eight) hours. ?  ?aspirin 81 MG chewable tablet ?Chew 81 mg by mouth at bedtime. ?  ?cetirizine 10 MG tablet ?Commonly known as: ZYRTEC ?Take 10 mg by mouth daily. ?  ?docusate sodium 100 MG capsule ?Commonly known as: Colace ?Take 1 capsule (100 mg total) by mouth 2 (two) times daily. ?What changed: when to take this ?  ?famotidine 20 MG tablet ?Commonly known as: PEPCID ?Take 20 mg by mouth daily. ?  ?hydrOXYzine 25 MG tablet ?Commonly known as: ATARAX ?Take 25 mg by mouth at bedtime. ?  ?lisinopril-hydrochlorothiazide 10-12.5 MG tablet ?  Commonly known as: ZESTORETIC ?Take 1 tablet by mouth daily. ?  ?methocarbamol 750 MG tablet ?Commonly known as: ROBAXIN ?Take 750 mg by mouth 3 (three) times daily. ?  ?morphine 30 MG 12 hr tablet ?Commonly known as: MS CONTIN ?Take 30 mg by mouth every 12 (twelve) hours. ?  ?nitroGLYCERIN 0.4 MG SL tablet ?Commonly known as: NITROSTAT ?Place 1 tablet (0.4 mg total) under the tongue every 5 (five) minutes as needed for chest pain. ?  ?nystatin cream ?Commonly known as: MYCOSTATIN ?Apply 1 application. topically daily as needed (yeast). ?  ?oxyCODONE 15 MG immediate release tablet ?Commonly known as: ROXICODONE ?Take 15 mg by mouth every 4 (four) hours as needed for pain. ?  ?polyethylene glycol 17 g packet ?Commonly known as: MIRALAX / GLYCOLAX ?Take 17 g by mouth 2 (two) times daily. ?What changed: when to take this ?  ?pravastatin 80 MG tablet ?Commonly known as: PRAVACHOL ?Take 1 tablet (80 mg total) by mouth daily. ?   ?pregabalin 75 MG capsule ?Commonly known as: LYRICA ?TAKE ONE CAPSULE BY MOUTH THREE TIMES DAILY ?What changed:  ?how much to take ?when to take this ?additional instructions ?  ?sildenafil 100 MG tablet ?Commonl

## 2021-10-08 NOTE — Discharge Instructions (Signed)
Wound Care °Keep the incision clean and dry remove the outer dressing in 2 days, leave the Steri-Strips intact.  °Do not put any creams, lotions, or ointments on incision. °Leave steri-strips on back.  They will fall off by themselves. ° °Activity °Walk each and every day, increasing distance each day. °No lifting greater than 5 lbs.  °No lifting no bending no twisting no driving or riding a car unless coming back and forth to see me. °If provided with back brace, wear when out of bed.  It is not necessary to wear brace in bed. ° °Diet °Resume your normal diet.  ° °Call Your Doctor If Any of These Occur °Redness, drainage, or swelling at the wound.  °Temperature greater than 101 degrees. °Severe pain not relieved by pain medication. °Incision starts to come apart. ° °Follow Up Appt °Call today for appointment in 1-2 weeks ([phone]) or for problems.  If you have any hardware placed in your spine, you will need an x-ray before your appointment. °  °

## 2021-10-12 ENCOUNTER — Encounter (HOSPITAL_COMMUNITY): Payer: Self-pay | Admitting: Neurological Surgery

## 2022-01-18 ENCOUNTER — Other Ambulatory Visit: Payer: Self-pay

## 2022-01-18 ENCOUNTER — Emergency Department (HOSPITAL_COMMUNITY): Payer: Medicare Other

## 2022-01-18 ENCOUNTER — Ambulatory Visit (HOSPITAL_COMMUNITY)
Admission: EM | Admit: 2022-01-18 | Discharge: 2022-01-18 | Disposition: A | Discharge status: Home / Self Care | Payer: Medicare Other

## 2022-01-18 ENCOUNTER — Emergency Department (HOSPITAL_COMMUNITY)
Admission: EM | Admit: 2022-01-18 | Discharge: 2022-01-19 | Disposition: A | Discharge status: Home / Self Care | Payer: Medicare Other | Attending: Emergency Medicine | Admitting: Emergency Medicine

## 2022-01-18 ENCOUNTER — Encounter (HOSPITAL_COMMUNITY): Payer: Self-pay

## 2022-01-18 DIAGNOSIS — Z96651 Presence of right artificial knee joint: Secondary | ICD-10-CM | POA: Insufficient documentation

## 2022-01-18 DIAGNOSIS — R519 Headache, unspecified: Secondary | ICD-10-CM | POA: Insufficient documentation

## 2022-01-18 DIAGNOSIS — M542 Cervicalgia: Secondary | ICD-10-CM | POA: Diagnosis not present

## 2022-01-18 DIAGNOSIS — W19XXXA Unspecified fall, initial encounter: Secondary | ICD-10-CM

## 2022-01-18 DIAGNOSIS — W010XXA Fall on same level from slipping, tripping and stumbling without subsequent striking against object, initial encounter: Secondary | ICD-10-CM | POA: Diagnosis not present

## 2022-01-18 DIAGNOSIS — I10 Essential (primary) hypertension: Secondary | ICD-10-CM | POA: Diagnosis not present

## 2022-01-18 DIAGNOSIS — M545 Low back pain, unspecified: Secondary | ICD-10-CM | POA: Insufficient documentation

## 2022-01-18 DIAGNOSIS — Z87891 Personal history of nicotine dependence: Secondary | ICD-10-CM | POA: Insufficient documentation

## 2022-01-18 DIAGNOSIS — S0990XA Unspecified injury of head, initial encounter: Secondary | ICD-10-CM

## 2022-01-18 NOTE — ED Provider Triage Note (Signed)
Emergency Medicine Provider Triage Evaluation Note  Darin Gomez , a 71 y.o. male  was evaluated in triage.  Pt complains of head injury, neck pain, headache, nausea, excess sleepiness since fall on Saturday night.  He does not take blood thinners.  He does have significant previous history of spinal disease, has history of cauda equina and has a spinal stimulator in place.  Reports additionally has some right hip pain, but has been able to ambulate since this fall.  Reports some photosensitivity in addition to nausea.  Headache comes and goes but has been surprisingly persistent.  Does not have any urinary, fecal incontinence since fall but does report some possible increased tingling of lower extremities from his baseline. Review of Systems  Positive: Fall, head injury, hip pain, neck pain Negative: Urinary, fecal incontinence  Physical Exam  BP (!) 147/79   Pulse 62   Temp 98.5 F (36.9 C) (Oral)   Resp 16   Ht '6\' 1"'$  (1.854 m)   Wt 129.3 kg   SpO2 96%   BMI 37.60 kg/m  Gen:   Awake, no distress   Resp:  Normal effort  MSK:   Moves extremities without difficulty  Other:  Cranial nerves II through XII grossly intact.  Intact finger-nose, intact heel-to-shin.  Romberg negative, gait normal.  Alert and oriented x3.  Moves all 4 limbs spontaneously, normal coordination.  No pronator drift.  Intact strength 5 out of 5 bilateral upper and lower extremities.  Patient does have increased nausea with EOMs, but no palsy of CN III, IV, VI, pupils are equal round reactive to light.  Medical Decision Making  Medically screening exam initiated at 2:33 PM.  Appropriate orders placed.  KALLAN MERRICK was informed that the remainder of the evaluation will be completed by another provider, this initial triage assessment does not replace that evaluation, and the importance of remaining in the ED until their evaluation is complete.  Work-up initiated   Anselmo Pickler, [place] 01/18/22 1436

## 2022-01-18 NOTE — ED Provider Notes (Signed)
Presents to the emergency department after a fall with head injury. Reports Saturday night he was standing up from a chair when he fell and hit his head very hard on solid ground.  Wife witnessed the event and reports no loss of consciousness, does report patient does not remember the event and was confused after the fall.  He had an episode of vomiting. He is not anticoagulated, but does take daily aspirin.  Reports pain in the head, neck, back, hips.  Has history of spinal surgery, spinal cord stimulator in place, right knee replacement, cauda equina, bilateral lower extremity neuromuscular disorder  Vitals are stable in clinic and he is alert and oriented to person place and time.  With age, head injury, event amnesia, patient needs to be evaluated in the emergency department.  Discussed with patient and wife that urgent care does not have the resources available to fully evaluate him after this incident.  Patient and wife verbalized understanding, wife will transport him to the ED via personal vehicle.   Lilibeth Opie, [place], [place] 01/18/22 1320

## 2022-01-18 NOTE — Discharge Instructions (Addendum)
D/C to ED via POV

## 2022-01-18 NOTE — ED Triage Notes (Signed)
Pt states tripped and fell on Saturday and hit his head very hard. Denies LOC. States was confused after the fall. Pt a/o x4

## 2022-01-18 NOTE — ED Triage Notes (Signed)
Patient is being discharged from the Urgent Care and sent to the Emergency Department via  POV. Per PA, patient is in need of higher level of care due to fall hitting his head. Patient is aware and verbalizes understanding of plan of care.  Vitals:   01/18/22 1314  BP: (!) 154/90  Pulse: 64  Resp: 18  SpO2: 97%

## 2022-01-18 NOTE — ED Triage Notes (Signed)
Reports he cant remember falling.  Family reports he was getting up out of his recliner and tripped and fell.  Denies LOC and dnies bloodthinners.  UC sent here for CT scan.

## 2022-01-19 NOTE — ED Notes (Signed)
Patient verbalizes understanding of discharge instructions. Opportunity for questioning and answers were provided. Armband removed by staff, pt discharged from ED. Pt taken to ED entrance via wheel chair.  

## 2022-01-19 NOTE — Discharge Instructions (Signed)
You were evaluated in the Emergency Department and after careful evaluation, we did not find any emergent condition requiring admission or further testing in the hospital.  Your exam/testing today was overall reassuring.  Symptoms seem to be due to bruising or strain from the fall.  Possibly a concussion as well.  CTs and x-rays did not show any emergencies.  Recommend follow-up with your primary care doctor and/or spinal expert.  Recommend mental and physical rest as we discussed.  Please return to the Emergency Department if you experience any worsening of your condition.  Thank you for allowing [place] to be a part of your care.

## 2022-01-19 NOTE — ED Provider Notes (Signed)
[facility] Emergency Department Provider Note MRN:  [id number]  Arrival date & time: 01/19/22     Chief Complaint   Fall   History of Present Illness   Darin Gomez is a 71 y.o. year-old male with a history of hypertension, cauda equina presenting to the ED with chief complaint of fall.  Patient has a history of cauda equina and some decrease sensation to his legs.  He thinks that he stood up and lost his balance and fell.  This occurred on Saturday few days ago.  Has been having some lingering headache, worsened neck and lower back pain.  No new numbness or weakness to the arms or legs, no vomiting.  Review of Systems  A thorough review of systems was obtained and all systems are negative except as noted in the HPI and PMH.   Patient's Health History    Past Medical History:  Diagnosis Date   Allergy    Arthritis    mainly in back   Back pain    Balance disorder    uses cane daily   Blood in urine    Chest pain    Constipation    Diverticulosis 02/14/2018   noted on colonoscopy   Environmental allergies    Foot swelling    Bilateral   GERD (gastroesophageal reflux disease)    Headache(784.0)    occas.  migraines  (from last surgery)   Heart murmur    as child....Marland Kitchenno problems as an adult   History of colon polyps 02/14/2018   Hyperlipidemia    Hypertension    dx  10 yrs or more  ago   Insomnia    Neck pain    Neuromuscular disorder (HCC)    bilateral feet   Night sweats    Painful swelling of joint    PONV (postoperative nausea and vomiting)    Poor circulation of extremity    legs   Shortness of breath    with exertion   Sinus disorder    Spinal headache    Vertigo     Past Surgical History:  Procedure Laterality Date   ABDOMINAL EXPOSURE N/A 09/14/2019   Procedure: ABDOMINAL EXPOSURE;  Surgeon: [name], MD;  Location: [place];  Service: Vascular;  Laterality: N/A;   ANTERIOR LAT LUMBAR FUSION Right 09/14/2019    Procedure: Right Lumbar three-four Lumbar four-five  Anterolateral lumbar interbody fusion;  Surgeon: Erline Levine, MD;  Location: [address];  Service: Neurosurgery;  Laterality: Right;   ANTERIOR LUMBAR FUSION N/A 09/14/2019   Procedure: Lumbar five Sacral one Anterior lumbar interbody fusion;  Surgeon: Erline Levine, MD;  Location: [place];  Service: Neurosurgery;  Laterality: N/A;   BACK SURGERY     CARDIAC CATHETERIZATION     03/04/14   COLONOSCOPY     FRACTURE SURGERY     left arm   KNEE SURGERY Right 01/02/2018   torn minicus   LEFT HEART CATHETERIZATION WITH CORONARY ANGIOGRAM N/A 03/04/2014   Procedure: LEFT HEART CATHETERIZATION WITH CORONARY ANGIOGRAM;  Surgeon: Peter M Martinique, MD;  Location: [facility] CATH LAB;  Service: Cardiovascular;  Laterality: N/A;   LUMBAR LAMINECTOMY/DECOMPRESSION MICRODISCECTOMY  03/15/2012   Procedure: LUMBAR LAMINECTOMY/DECOMPRESSION MICRODISCECTOMY 2 LEVELS;  Surgeon: Floyce Stakes, MD;  Location: [place] [facility];  Service: Neurosurgery;  Laterality: Bilateral;  Bilateral Lumbar three, lumbar four, lumbar five Laminectomy   LUMBAR PERCUTANEOUS PEDICLE SCREW 3 LEVEL N/A 09/14/2019   Procedure: Lumbar three to Sacral one posterior pedicle  screw fixation;  Surgeon: Erline Levine, MD;  Location: [place];  Service: Neurosurgery;  Laterality: N/A;   POLYPECTOMY     SPINAL CORD STIMULATOR BATTERY EXCHANGE N/A 09/14/2021   Procedure: Removal of spinal cord stimulator leads;  Surgeon: [name], MD;  Location: [place];  Service: Neurosurgery;  Laterality: N/A;   SPINAL CORD STIMULATOR INSERTION N/A 05/29/2014   Procedure: LUMBAR SPINAL CORD STIMULATOR INSERTION;  Surgeon: Bonna Gains, MD;  Location: [facility];  Service: Neurosurgery;  Laterality: N/A;   SPINAL CORD STIMULATOR INSERTION N/A 10/07/2021   Procedure: Spinal cord stimulator via Thoracic nine laminectomy;  Surgeon: [name], MD;  Location: [place];  Service: Neurosurgery;  Laterality: N/A;   TOTAL KNEE  ARTHROPLASTY Right 02/08/2019   Procedure: TOTAL KNEE ARTHROPLASTY;  Surgeon: Paralee Cancel, MD;  Location: [facility];  Service: Orthopedics;  Laterality: Right;  73 mins    Family History  Problem Relation Age of Onset   Heart attack Mother    Hypertension Mother    Hyperlipidemia Mother    Diabetes Mother    Cancer Father    Heart attack Brother 75   Hyperlipidemia Brother    Hypertension Brother    Heart disease Brother    Breast cancer Neg Hx    Colon polyps Neg Hx    Colon cancer Neg Hx    Esophageal cancer Neg Hx    Liver cancer Neg Hx    Ovarian cancer Neg Hx    Pancreatic cancer Neg Hx    Prostate cancer Neg Hx    Rectal cancer Neg Hx    Stomach cancer Neg Hx     Social History   Socioeconomic History   Marital status: Married    Spouse name: Not on file   Number of children: 3   Years of education: Not on file   Highest education level: Some college, no degree  Occupational History   Not on file  Tobacco Use   Smoking status: Former    Years: 45.00    Types: Cigarettes    Passive exposure: Past   Smokeless tobacco: Former    Types: Chew    Quit date: 2013   Tobacco comments:    quit smoking cigarettes > 30 years ago  Vaping Use   Vaping Use: Never used  Substance and Sexual Activity   Alcohol use: Yes    Alcohol/week: 2.0 - 6.0 standard drinks of alcohol    Types: 2 - 3 Shots of liquor per week   Drug use: Never   Sexual activity: Not Currently  Other Topics Concern   Not on file  Social History Narrative   Not on file   Social Determinants of Health   Financial Resource Strain: Low Risk  (10/11/2018)   Overall Financial Resource Strain (CARDIA)    Difficulty of Paying Living Expenses: Not very hard  Food Insecurity: Food Insecurity Present (10/11/2018)   Hunger Vital Sign    Worried About [place] in the Last Year: Sometimes true    Ran Out of Food in the Last Year: Sometimes true  Transportation Needs: No Transportation Needs  (10/11/2018)   PRAPARE - Hydrologist (Medical): No    Lack of Transportation (Non-Medical): No  Physical Activity: Insufficiently Active (10/11/2018)   Exercise Vital Sign    Days of Exercise per Week: 2 days    Minutes of Exercise per Session: 20 min  Stress: Stress Concern Present (  10/11/2018)   [place]    Feeling of Stress : To some extent  Social Connections: Unknown (10/11/2018)   Social Connection and Isolation Panel [NHANES]    Frequency of Communication with Friends and Family: Not on file    Frequency of Social Gatherings with Friends and Family: Not on file    Attends Religious Services: 1 to 4 times per year    Active Member of [employer] or Organizations: No    Attends Archivist Meetings: Never    Marital Status: Married  Human resources officer Violence: Not At Risk (10/11/2018)   Humiliation, Afraid, Rape, and Kick questionnaire    Fear of Current or Ex-Partner: No    Emotionally Abused: No    Physically Abused: No    Sexually Abused: No     Physical Exam   Vitals:   01/19/22 0052 01/19/22 0115  BP:  137/73  Pulse:  (!) 56  Resp:  15  Temp: 97.9 F (36.6 C)   SpO2:  96%    CONSTITUTIONAL: Well-appearing, NAD NEURO/PSYCH:  Alert and oriented x 3, no focal deficits EYES:  eyes equal and reactive ENT/NECK:  no LAD, no JVD CARDIO: Regular rate, well-perfused, normal S1 and S2 PULM:  CTAB no wheezing or rhonchi GI/GU:  non-distended, non-tender MSK/SPINE:  No gross deformities, no edema SKIN:  no rash, atraumatic   *Additional and/or pertinent findings included in MDM below  Diagnostic and Interventional Summary    EKG Interpretation  Date/Time:    Ventricular Rate:    PR Interval:    QRS Duration:   QT Interval:    QTC Calculation:   R Axis:     Text Interpretation:         Labs Reviewed - No data to display  DG Hip Unilat W or Wo Pelvis 2-3 Views  Right  Final Result    CT Head Wo Contrast  Final Result    CT Cervical Spine Wo Contrast  Final Result    CT Lumbar Spine Wo Contrast  Final Result      Medications - No data to display   Procedures  /  Critical Care Procedures  ED Course and Medical Decision Making  Initial Impression and Ddx Suspected mechanical fall, history of cauda equina, back surgery.  Well-appearing and baseline in terms of neurological exam at this time.  Awaiting imaging.  Past medical/surgical history that increases complexity of ED encounter: Cauda equina  Interpretation of Diagnostics CT imaging is reassuring without acute bleeding or fracture, x-ray is normal.  Patient Reassessment and Ultimate Disposition/Management     Appropriate for discharge.  Patient management required discussion with the following services or consulting groups:  None  Complexity of Problems Addressed Acute illness or injury that poses threat of life of bodily function  Additional Data Reviewed and Analyzed Further history obtained from: Further history from spouse/family member  Additional Factors Impacting ED Encounter Risk None  Barth Kirks. [place] Small, [place] [email or web address]  Final Clinical Impressions(s) / ED Diagnoses     ICD-10-CM   1. Fall, initial encounter  W19.XXXA     2. Acute nonintractable headache, unspecified headache type  R51.9     3. Acute low back pain, unspecified back pain laterality, unspecified whether sciatica present  M54.50     4. Neck pain  M54.2       ED Discharge Orders     None  Discharge Instructions Discussed with and Provided to Patient:    Discharge Instructions      You were evaluated in the Emergency Department and after careful evaluation, we did not find any emergent condition requiring admission or further testing in the hospital.  Your exam/testing today was overall reassuring.  Symptoms  seem to be due to bruising or strain from the fall.  Possibly a concussion as well.  CTs and x-rays did not show any emergencies.  Recommend follow-up with your primary care doctor and/or spinal expert.  Recommend mental and physical rest as we discussed.  Please return to the Emergency Department if you experience any worsening of your condition.  Thank you for allowing [place] to be a part of your care.       Maudie Flakes, MD 01/19/22 [phone]

## 2022-03-04 ENCOUNTER — Other Ambulatory Visit: Payer: Self-pay | Admitting: Neurological Surgery

## 2022-03-04 DIAGNOSIS — S32009G Unspecified fracture of unspecified lumbar vertebra, subsequent encounter for fracture with delayed healing: Secondary | ICD-10-CM

## 2022-03-08 ENCOUNTER — Ambulatory Visit
Admission: RE | Admit: 2022-03-08 | Discharge: 2022-03-08 | Disposition: A | Discharge status: Home / Self Care | Payer: Medicare Other | Source: Ambulatory Visit | Attending: Neurological Surgery | Admitting: Neurological Surgery

## 2022-03-08 DIAGNOSIS — S32009G Unspecified fracture of unspecified lumbar vertebra, subsequent encounter for fracture with delayed healing: Secondary | ICD-10-CM

## 2022-03-18 ENCOUNTER — Other Ambulatory Visit: Payer: Self-pay | Admitting: Neurological Surgery

## 2022-03-18 ENCOUNTER — Telehealth: Payer: Self-pay | Admitting: Cardiovascular Disease

## 2022-03-18 NOTE — Telephone Encounter (Signed)
    Primary Cardiologist:Iley Rockey Situ, MD  Chart reviewed as part of pre-operative protocol coverage. Because of Nyle Limb Sax's past medical history and time since last visit, he/she will require a follow-up visit in order to better assess preoperative cardiovascular risk.  Pre-op covering staff: - Please schedule appointment and call patient to inform them. - Please contact requesting surgeon's office via preferred method (i.e, phone, fax) to inform them of need for appointment prior to surgery.  If applicable, this message will also be routed to pharmacy pool and/or primary cardiologist for input on holding anticoagulant/antiplatelet agent as requested below so that this information is available at time of patient's appointment.   Deberah Pelton, NP  03/18/2022, 1:18 PM

## 2022-03-18 NOTE — Telephone Encounter (Signed)
   Pre-operative Risk Assessment    Patient Name: Darin Gomez  DOB: 01-09-1951 MRN: [id number]      Request for Surgical Clearance    Procedure:   L1-2 POSTERIOR LATERAL FUSION /EXPLORATION  Date of Surgery:  Clearance 03/29/22                               Surgeon:  DR Sherley Bounds Surgeon's Group or Practice Name:  [facility] Phone number:  [phone] Fax number:  [phone]  Type of Clearance Requested:   - Medical    Additional requests/questions:    SignedEli Phillips   03/18/2022, 12:53 PM

## 2022-03-19 NOTE — Telephone Encounter (Signed)
Left message for the pt to call back to schedule an appt in office for pre op clearance,.

## 2022-03-22 NOTE — Telephone Encounter (Signed)
Pt has appt 102/7/23 with Ignacia Bayley, NP for pre op clearance.

## 2022-03-25 NOTE — Progress Notes (Signed)
Cardiology Clinic Note   Darin Gomez Name: Darin Gomez Date of Encounter: 03/26/2022  Primary Care Provider:  Cyndi Bender, PA-C Primary Cardiologist:  Ida Rogue, MD  Darin Gomez Profile    Darin Gomez is a 71 year-old male with a past medical history of CAD, hypertension, hyperlipidemia, chronic pain who presents to [facility] clinic today for cardiac clearance for lumbar fusion surgery.  Past Medical History    Past Medical History:  Diagnosis Date   Allergy    Arthritis    mainly in back   Back pain    Balance disorder    uses cane daily   Blood in urine    Chest pain    Constipation    Diverticulosis 02/14/2018   noted on colonoscopy   DOE (dyspnea on exertion)    Environmental allergies    Foot swelling    Bilateral   GERD (gastroesophageal reflux disease)    Headache(784.0)    occas.  migraines  (from last surgery)   Heart murmur    as child....Marland Kitchenno problems as an adult   History of colon polyps 02/14/2018   Hyperlipidemia    Hypertension    dx  10 yrs or more  ago   Insomnia    Neck pain    Neuromuscular disorder (HCC)    bilateral feet   Night sweats    Nonobstructive CAD (coronary artery disease)    a. 01/2014 MV: mild lateral ischemia. EF 56%; b. 02/2014 Cath: LM nl, LAD nl, LCX min irregs/<10%m, RCA mild ectasia mid vessel, otw nl, EF 55-65%.   Painful swelling of joint    PONV (postoperative nausea and vomiting)    Poor circulation of extremity    legs   Sinus disorder    Spinal headache    Vertigo    Past Surgical History:  Procedure Laterality Date   ABDOMINAL EXPOSURE N/A 09/14/2019   Procedure: ABDOMINAL EXPOSURE;  Surgeon: [name], MD;  Location: [place];  Service: Vascular;  Laterality: N/A;   ANTERIOR LAT LUMBAR FUSION Right 09/14/2019   Procedure: Right Lumbar three-four Lumbar four-five  Anterolateral lumbar interbody fusion;  Surgeon: Erline Levine, MD;  Location: [facility];  Service: Neurosurgery;  Laterality: Right;   ANTERIOR  LUMBAR FUSION N/A 09/14/2019   Procedure: Lumbar five Sacral one Anterior lumbar interbody fusion;  Surgeon: Erline Levine, MD;  Location: [facility];  Service: Neurosurgery;  Laterality: N/A;   BACK SURGERY     CARDIAC CATHETERIZATION     03/04/14   COLONOSCOPY     FRACTURE SURGERY     left arm   KNEE SURGERY Right 01/02/2018   torn minicus   LEFT HEART CATHETERIZATION WITH CORONARY ANGIOGRAM N/A 03/04/2014   Procedure: LEFT HEART CATHETERIZATION WITH CORONARY ANGIOGRAM;  Surgeon: Peter M Martinique, MD;  Location: [facility] CATH LAB;  Service: Cardiovascular;  Laterality: N/A;   LUMBAR LAMINECTOMY/DECOMPRESSION MICRODISCECTOMY  03/15/2012   Procedure: LUMBAR LAMINECTOMY/DECOMPRESSION MICRODISCECTOMY 2 LEVELS;  Surgeon: Floyce Stakes, MD;  Location: [facility];  Service: Neurosurgery;  Laterality: Bilateral;  Bilateral Lumbar three, lumbar four, lumbar five Laminectomy   LUMBAR PERCUTANEOUS PEDICLE SCREW 3 LEVEL N/A 09/14/2019   Procedure: Lumbar three to Sacral one posterior pedicle screw fixation;  Surgeon: Erline Levine, MD;  Location: [place] [facility];  Service: Neurosurgery;  Laterality: N/A;   POLYPECTOMY     SPINAL CORD STIMULATOR BATTERY EXCHANGE N/A 09/14/2021   Procedure: Removal of spinal cord stimulator leads;  Surgeon: [name], MD;  Location: [facility];  Service: Neurosurgery;  Laterality: N/A;   SPINAL CORD STIMULATOR INSERTION N/A 05/29/2014   Procedure: LUMBAR SPINAL CORD STIMULATOR INSERTION;  Surgeon: Bonna Gains, MD;  Location: [facility];  Service: Neurosurgery;  Laterality: N/A;   SPINAL CORD STIMULATOR INSERTION N/A 10/07/2021   Procedure: Spinal cord stimulator via Thoracic nine laminectomy;  Surgeon: [name], MD;  Location: [facility];  Service: Neurosurgery;  Laterality: N/A;   TOTAL KNEE ARTHROPLASTY Right 02/08/2019   Procedure: TOTAL KNEE ARTHROPLASTY;  Surgeon: Paralee Cancel, MD;  Location: [facility];  Service: Orthopedics;  Laterality: Right;  70 mins    Allergies  Allergies   Allergen Reactions   Gabapentin Swelling   Tape     Plastic tape: Paper tape ONLY!    Keppra [Levetiracetam] Nausea And Vomiting   Valium [Diazepam] Other (See Comments)    Significant depression; "a different person"    History of Present Illness    Darin Gomez has a past medical history of: Nuclear stress test at [facility] 02/27/2014: Mild reversibility in Darin lateral wall concerning for focal ischemia in Darin area.  No fixed defects are identified to suggest infarct. Wall motion and ejection fraction analysis are within normal limits. Left heart catheterization at [facility] 02/2014: No significant CAD.  Normal LV function. Hypertension. Hyperlipidemia. Chronic pain.  Was last seen in [facility] office on 01/27/2021 by Dr. Rockey Situ.  At that time Darin Gomez was having atypical chest pain.  Darin Gomez cholesterol was at goal.  Blood pressure was elevated in Darin office but reported is well controlled at home.  It was recommended to continue monitoring pressures at home.  All medications were continued.  Today, Darin Gomez is accompanied by Darin Gomez wife.  Darin Gomez comes in for preop clearance to undergo lumbar fusion surgery on 04/14/2022.  Darin Gomez is doing well and staying as active as possible in Darin setting of Darin Gomez chronic pain.  Darin Gomez walks approximately a quarter of a mile twice a day on Darin Gomez property, gardens and maintains Darin property, and cares for Darin Gomez dogs.  Is in Darin Gomez two-story home and does not have any difficulty climbing stairs. Darin Gomez denies chest pain, shortness of breath, or DOE.  Darin Gomez wife questions if Darin Gomez should be further evaluated for lower extremity vascular disease.  She states Darin Gomez legs and feet are always cold, they seem to be somewhat discolored from how they normally look (more red), and Darin Gomez has pain in Darin Gomez legs and feet.  Darin Gomez feels Darin symptoms are all related to Darin Gomez history of multiple back fusions.  Darin Gomez takes Lyrica for neuropathy.  Darin Gomez has constant pain in Darin Gomez legs that questionably worsens when Darin Gomez walks and  slightly improve when Darin Gomez rests.  Home Medications    Current Meds  Medication Sig   acetaminophen (TYLENOL) 500 MG tablet Take 2 tablets (1,000 mg total) by mouth every 8 (eight) hours.   aspirin 81 MG chewable tablet Chew 81 mg by mouth at bedtime.   cetirizine (ZYRTEC) 10 MG tablet Take 10 mg by mouth daily.    famotidine (PEPCID) 20 MG tablet Take 20 mg by mouth daily.   hydrOXYzine (ATARAX) 25 MG tablet Take 25 mg by mouth at bedtime.   lisinopril-hydrochlorothiazide (ZESTORETIC) 10-12.5 MG tablet Take 1 tablet by mouth daily.   methocarbamol (ROBAXIN) 750 MG tablet Take 750 mg by mouth 3 (three) times daily.   morphine (MS CONTIN) 30 MG 12 hr tablet Take 30 mg by mouth every 12 (twelve) hours.  nitroGLYCERIN (NITROSTAT) 0.4 MG SL tablet Place 1 tablet (0.4 mg total) under Darin tongue every 5 (five) minutes as needed for chest pain.   nystatin cream (MYCOSTATIN) Apply 1 application. topically daily as needed (yeast).   oxyCODONE (ROXICODONE) 15 MG immediate release tablet Take 15 mg by mouth every 4 (four) hours as needed for pain.   polyethylene glycol (MIRALAX / GLYCOLAX) 17 g packet Take 17 g by mouth 2 (two) times daily. (Darin Gomez taking differently: Take 17 g by mouth daily.)   pravastatin (PRAVACHOL) 80 MG tablet Take 1 tablet (80 mg total) by mouth daily.   pregabalin (LYRICA) 75 MG capsule TAKE ONE CAPSULE BY MOUTH THREE TIMES DAILY  (Darin Gomez taking differently: Take 75-150 mg by mouth See admin instructions. Take 75 mg by mouth in Darin morning and 150 mg in Darin afternoon and bedtime)   sildenafil (VIAGRA) 100 MG tablet Take 100 mg by mouth daily as needed for erectile dysfunction.   testosterone cypionate (DEPOTESTOSTERONE CYPIONATE) 200 MG/ML injection Inject 200 mg into Darin muscle every 14 (fourteen) days.   Vitamin D, Ergocalciferol, (DRISDOL) 50000 UNITS CAPS capsule Take 50,000 Units by mouth 2 (two) times a week.    zolpidem (AMBIEN) 10 MG tablet Take 10 mg by mouth at  bedtime.    Family History    Family History  Problem Relation Age of Onset   Heart attack Mother    Hypertension Mother    Hyperlipidemia Mother    Diabetes Mother    Cancer Father    Heart attack Brother 40   Hyperlipidemia Brother    Hypertension Brother    Heart disease Brother    Breast cancer Neg Hx    Colon polyps Neg Hx    Colon cancer Neg Hx    Esophageal cancer Neg Hx    Liver cancer Neg Hx    Ovarian cancer Neg Hx    Pancreatic cancer Neg Hx    Prostate cancer Neg Hx    Rectal cancer Neg Hx    Stomach cancer Neg Hx    Darin Gomez indicated that Darin Gomez mother is deceased. Darin Gomez indicated that Darin Gomez father is deceased. Darin Gomez indicated that only one of Darin Gomez two brothers is alive. Darin Gomez indicated that Darin status of Darin Gomez neg hx is unknown.   Social History    Social History   Socioeconomic History   Marital status: Married    Spouse name: Not on file   Number of children: 3   Years of education: Not on file   Highest education level: Some college, no degree  Occupational History   Not on file  Tobacco Use   Smoking status: Former    Years: 45.00    Types: Cigarettes    Passive exposure: Past   Smokeless tobacco: Former    Types: Chew    Quit date: 2013   Tobacco comments:    quit smoking cigarettes > 30 years ago  Vaping Use   Vaping Use: Never used  Substance and Sexual Activity   Alcohol use: Yes    Alcohol/week: 2.0 - 6.0 standard drinks of alcohol    Types: 2 - 3 Shots of liquor per week   Drug use: Never   Sexual activity: Not Currently  Other Topics Concern   Not on file  Social History Narrative   Not on file   Social Determinants of Health   Financial Resource Strain: Low Risk  (10/11/2018)   Overall Financial Resource Strain (CARDIA)    Difficulty of  Paying Living Expenses: Not very hard  Food Insecurity: Food Insecurity Present (10/11/2018)   Hunger Vital Sign    Worried About Running Out of Food in Darin Last Year: Sometimes true    Ran Out of Food in Darin  Last Year: Sometimes true  Transportation Needs: No Transportation Needs (10/11/2018)   PRAPARE - Hydrologist (Medical): No    Lack of Transportation (Non-Medical): No  Physical Activity: Insufficiently Active (10/11/2018)   Exercise Vital Sign    Days of Exercise per Week: 2 days    Minutes of Exercise per Session: 20 min  Stress: Stress Concern Present (10/11/2018)   Winter    Feeling of Stress : To some extent  Social Connections: Unknown (10/11/2018)   Social Connection and Isolation Panel [NHANES]    Frequency of Communication with Friends and Family: Not on file    Frequency of Social Gatherings with Friends and Family: Not on file    Attends Religious Services: 1 to 4 times per year    Active Member of [employer] or Organizations: No    Attends Archivist Meetings: Never    Marital Status: Married  Human resources officer Violence: Not At Risk (10/11/2018)   Humiliation, Afraid, Rape, and Kick questionnaire    Fear of Current or Ex-Partner: No    Emotionally Abused: No    Physically Abused: No    Sexually Abused: No     Review of Systems    General:  No chills, fever, night sweats or weight changes.  Cardiovascular:  No chest pain, dyspnea on exertion, edema, orthopnea, palpitations, paroxysmal nocturnal dyspnea. Dermatological: No rash, lesions/masses Respiratory: No cough, dyspnea Urologic: No hematuria, dysuria Abdominal:   No nausea, vomiting, diarrhea, bright red blood per rectum, melena, or hematemesis Musculoskeletal: Mid to low back pain.  Pain in bilateral lower extremities. Neurologic:  No visual changes, weakness, changes in mental status. All other systems reviewed and are otherwise negative except as noted above.  Physical Exam    VS:  BP 126/74 (BP Location: Left Arm, Darin Gomez Position: Sitting, Cuff Size: Large)   Pulse 64   Ht 6' (1.829 m)   Wt 264 lb (119.7  kg)   SpO2 98%   BMI 35.80 kg/m  , BMI Body mass index is 35.8 kg/m. GEN:  Well nourished, well developed, in no acute distress. HEENT: Normal. Neck: Supple, no JVD, carotid bruits, or masses. Cardiac: RRR, no murmurs, rubs, or gallops. No clubbing, cyanosis, edema.  Radials/DP/PT 2+ and equal bilaterally, capillary refill <3 seconds.  Respiratory:  Respirations regular and unlabored, clear to auscultation bilaterally. GI: Soft, nontender, nondistended. MS: No deformity or atrophy. Skin: Warm and dry, no rash. Neuro: Strength and sensation are intact. Psych: Normal affect.  Accessory Clinical Findings    Recent Labs: 10/07/2021: BUN 15; Creatinine, Ser 0.95; Hemoglobin 15.2; Platelets 340; Potassium 4.2; Sodium 135   Recent Lipid Panel No results found for: "CHOL", "TRIG", "HDL", "CHOLHDL", "VLDL", "LDLCALC", "LDLDIRECT"       ECG personally reviewed by me today normal sinus rhythm, rate 64.  Unchanged from 01/27/2021    Assessment & Plan   Hypertension.  BP today 126/74.  Darin Gomez denies headache or dizziness.  Continue current regimen of lisinopril-hydrochlorothiazide 10/12.5 mg daily. Hyperlipidemia. Last lipid panel 12/11/2020: LDL 87, HDL 37, triglycerides 155, total cholesterol 151. Neuropathy involving bilateral lower extremities.  Darin Gomez's wife is questioning Darin Gomez's constant lower extremity pain is due  to Darin neuropathy caused by Darin Gomez multiple back surgeries or if Darin Gomez could possibly have circulation/arterial issue.  Darin Gomez legs are cool to touch mostly hairless.  Mild skin color changes.  2+ PT/DP pulses and capillary refill of toes 3 seconds.  Offered ABI testing to Darin Gomez.  Darin Gomez declines ABI testing at this time.  Darin Gomez feels all of Darin Gomez symptoms are related to Darin Gomez back surgeries.  Darin Gomez may reconsider this next year. Preoperative cardiovascular exam. Darin Gomez does not have any unstable cardiac conditions.  Upon evaluation today, Darin Gomez can achieve 4 METs or greater without  anginal symptoms.  According to [facility] and AHA guidelines, Darin Gomez requires no further cardiac workup prior to Darin Gomez noncardiac surgery and should be at acceptable risk.  Our service is available as necessary in Darin perioperative period.   Disposition: Darin Gomez is cleared from cardiac standpoint to undergo lumbar fusion.  Follow-up in 12 months or sooner as needed.   Justice Britain. Carmellia Kreisler, NP-C     03/26/2022, 2:01 PM [address] [address] Office [phone] Fax [phone]   I spent 10 minutes examining this Darin Gomez, reviewing medications, and using Darin Gomez centered shared decision making involving Darin Gomez cardiac care.  Prior to Darin Gomez visit I spent greater than 20 minutes reviewing Darin Gomez past medical history,  medications, and prior cardiac tests.

## 2022-03-26 ENCOUNTER — Ambulatory Visit: Payer: Medicare Other | Attending: Nurse Practitioner | Admitting: Student

## 2022-03-26 ENCOUNTER — Encounter: Payer: Self-pay | Admitting: Nurse Practitioner

## 2022-03-26 VITALS — BP 126/74 | HR 64 | Ht 72.0 in | Wt 264.0 lb

## 2022-03-26 DIAGNOSIS — E782 Mixed hyperlipidemia: Secondary | ICD-10-CM | POA: Insufficient documentation

## 2022-03-26 DIAGNOSIS — G5793 Unspecified mononeuropathy of bilateral lower limbs: Secondary | ICD-10-CM | POA: Diagnosis not present

## 2022-03-26 DIAGNOSIS — Z01810 Encounter for preprocedural cardiovascular examination: Secondary | ICD-10-CM | POA: Insufficient documentation

## 2022-03-26 DIAGNOSIS — I10 Essential (primary) hypertension: Secondary | ICD-10-CM | POA: Diagnosis not present

## 2022-03-26 NOTE — Patient Instructions (Signed)
Medication Instructions:   Your physician recommends that you continue on your current medications as directed. Please refer to the Current Medication list given to you today.  *If you need a refill on your cardiac medications before your next appointment, please call your pharmacy*   Lab Work:  None Ordered  If you have labs (blood work) drawn today and your tests are completely normal, you will receive your results only by: [place] (if you have MyChart) OR A paper copy in the mail If you have any lab test that is abnormal or we need to change your treatment, we will call you to review the results.   Testing/Procedures:  None Ordered   Follow-Up: At [facility], you and your health needs are our priority.  As part of our continuing mission to provide you with exceptional heart care, we have created designated Provider Care Teams.  These Care Teams include your primary Cardiologist (physician) and Advanced Practice Providers (APPs -  Physician Assistants and Nurse Practitioners) who all work together to provide you with the care you need, when you need it.  We recommend signing up for the patient portal called "MyChart".  Sign up information is provided on this After Visit Summary.  MyChart is used to connect with patients for Virtual Visits (Telemedicine).  Patients are able to view lab/test results, encounter notes, upcoming appointments, etc.  Non-urgent messages can be sent to your provider as well.   To learn more about what you can do with MyChart, go to [email or web address].    Your next appointment:   12 month(s)  The format for your next appointment:   In Person  Provider:   You may see Ida Rogue, MD or one of the following Advanced Practice Providers on your designated Care Team:   Murray Hodgkins, NP Christell Faith, PA-C Cadence Kathlen Mody, PA-C Gerrie Nordmann, NP

## 2022-04-05 NOTE — Pre-Procedure Instructions (Signed)
Surgical Instructions    Your procedure is scheduled on Wednesday, April 14, 2022 at 10:00 AM.  Report to [facility] Main Entrance "A" at 8:00 A.M., then check in with the Admitting office.  Call this number if you have problems the morning of surgery:  [phone]   If you have any questions prior to your surgery date call [phone]: Open Monday-Friday 8am-4pm  *If you experience any cold or flu symptoms such as cough, fever, chills, shortness of breath, etc. between now and your scheduled surgery, please notify us.*    Remember:  Do not eat after midnight the night before your surgery  You may drink clear liquids until 7:00 AM the morning of your surgery.   Clear liquids allowed are: Water, Non-Citrus Juices (without pulp), Carbonated Beverages, Clear Tea, Black Coffee Only (NO MILK, CREAM OR POWDERED CREAMER of any kind), and Gatorade.    Take these medicines the morning of surgery with A SIP OF WATER:  cetirizine (ZYRTEC)  famotidine (PEPCID)  methocarbamol (ROBAXIN)  morphine (MS CONTIN)  pravastatin (PRAVACHOL)  pregabalin (LYRICA)   IF NEEDED: acetaminophen (TYLENOL)  nitroGLYCERIN (NITROSTAT)  oxyCODONE (ROXICODONE)   Follow your surgeon's instructions on when to stop Aspirin.  If no instructions were given by your surgeon then you will need to call the office to get those instructions.    As of today, STOP taking any Aleve, Naproxen, Ibuprofen, Motrin, Advil, Goody's, BC's, all herbal medications, fish oil, and all vitamins.                     Do NOT Smoke (Tobacco/Vaping) for 24 hours prior to your procedure.  If you use a CPAP at night, you may bring your mask/headgear for your overnight stay.   Contacts, glasses, piercing's, hearing aid's, dentures or partials may not be worn into surgery, please bring cases for these belongings.    For patients admitted to the hospital, discharge time will be determined by your treatment team.   Patients discharged  the day of surgery will not be allowed to drive home, and someone needs to stay with them for 24 hours.  SURGICAL WAITING ROOM VISITATION Patients having surgery or a procedure may have two support people in the waiting area. Visitors may stay in the waiting area during the procedure and switch out with other visitors if needed. Children under the age of 63 must have an adult accompany them who is not the patient. If the patient needs to stay at the hospital during part of their recovery, the visitor guidelines for inpatient rooms apply.  Please refer to the [facility] website for the visitor guidelines for Inpatients (after your surgery is over and you are in a regular room).    Special instructions:   [place]- Preparing For Surgery  Before surgery, you can play an important role. Because skin is not sterile, your skin needs to be as free of germs as possible. You can reduce the number of germs on your skin by washing with CHG (chlorahexidine gluconate) Soap before surgery.  CHG is an antiseptic cleaner which kills germs and bonds with the skin to continue killing germs even after washing.    Oral Hygiene is also important to reduce your risk of infection.  Remember - BRUSH YOUR TEETH THE MORNING OF SURGERY WITH YOUR REGULAR TOOTHPASTE  Please do not use if you have an allergy to CHG or antibacterial soaps. If your skin becomes reddened/irritated stop using the CHG.  Do  not shave (including legs and underarms) for at least 48 hours prior to first CHG shower. It is OK to shave your face.  Please follow these instructions carefully.   Shower the NIGHT BEFORE SURGERY and the MORNING OF SURGERY  If you chose to wash your hair, wash your hair first as usual with your normal shampoo.  After you shampoo, rinse your hair and body thoroughly to remove the shampoo.  Use CHG Soap as you would any other liquid soap. You can apply CHG directly to the skin and wash gently with a scrungie or a  clean washcloth.   Apply the CHG Soap to your body ONLY FROM THE NECK DOWN.  Do not use on open wounds or open sores. Avoid contact with your eyes, ears, mouth and genitals (private parts). Wash Face and genitals (private parts)  with your normal soap.   Wash thoroughly, paying special attention to the area where your surgery will be performed.  Thoroughly rinse your body with warm water from the neck down.  DO NOT shower/wash with your normal soap after using and rinsing off the CHG Soap.  Pat yourself dry with a CLEAN TOWEL.  Wear CLEAN PAJAMAS to bed the night before surgery  Place CLEAN SHEETS on your bed the night before your surgery  DO NOT SLEEP WITH PETS.   Day of Surgery: Take a shower with CHG soap. Do not wear jewelry. Do not wear lotions, powders, perfumes/colognes, or deodorant. Men may shave face and neck. Do not bring valuables to the hospital.  [facility] is not responsible for any belongings or valuables. Wear Clean/Comfortable clothing the morning of surgery Do not apply any deodorants/lotions.   Remember to brush your teeth WITH YOUR REGULAR TOOTHPASTE.   Please read over the following fact sheets that you were given.  If you received a COVID test during your pre-op visit  it is requested that you wear a mask when out in public, stay away from anyone that may not be feeling well and notify your surgeon if you develop symptoms. If you have been in contact with anyone that has tested positive in the last 10 days please notify you surgeon.

## 2022-04-06 ENCOUNTER — Encounter (HOSPITAL_COMMUNITY): Payer: Self-pay

## 2022-04-06 ENCOUNTER — Other Ambulatory Visit: Payer: Self-pay

## 2022-04-06 ENCOUNTER — Encounter (HOSPITAL_COMMUNITY)
Admission: RE | Admit: 2022-04-06 | Discharge: 2022-04-06 | Disposition: A | Discharge status: Home / Self Care | Payer: Medicare Other | Source: Ambulatory Visit | Attending: Neurological Surgery | Admitting: Neurological Surgery

## 2022-04-06 VITALS — BP 154/77 | HR 58 | Temp 97.7°F | Resp 17 | Ht 73.0 in | Wt 266.2 lb

## 2022-04-06 DIAGNOSIS — I2510 Atherosclerotic heart disease of native coronary artery without angina pectoris: Secondary | ICD-10-CM | POA: Insufficient documentation

## 2022-04-06 DIAGNOSIS — I10 Essential (primary) hypertension: Secondary | ICD-10-CM | POA: Insufficient documentation

## 2022-04-06 DIAGNOSIS — K219 Gastro-esophageal reflux disease without esophagitis: Secondary | ICD-10-CM | POA: Diagnosis not present

## 2022-04-06 DIAGNOSIS — Z87891 Personal history of nicotine dependence: Secondary | ICD-10-CM | POA: Diagnosis not present

## 2022-04-06 DIAGNOSIS — Z01812 Encounter for preprocedural laboratory examination: Secondary | ICD-10-CM | POA: Insufficient documentation

## 2022-04-06 DIAGNOSIS — Z01818 Encounter for other preprocedural examination: Secondary | ICD-10-CM

## 2022-04-06 DIAGNOSIS — M960 Pseudarthrosis after fusion or arthrodesis: Secondary | ICD-10-CM | POA: Insufficient documentation

## 2022-04-06 DIAGNOSIS — E785 Hyperlipidemia, unspecified: Secondary | ICD-10-CM | POA: Insufficient documentation

## 2022-04-06 LAB — CBC
HCT: 51.4 % (ref 39.0–52.0)
Hemoglobin: 17.2 g/dL — ABNORMAL HIGH (ref 13.0–17.0)
MCH: 30.3 pg (ref 26.0–34.0)
MCHC: 33.5 g/dL (ref 30.0–36.0)
MCV: 90.5 fL (ref 80.0–100.0)
Platelets: 255 10*3/uL (ref 150–400)
RBC: 5.68 MIL/uL (ref 4.22–5.81)
RDW: 12.1 % (ref 11.5–15.5)
WBC: 7.1 10*3/uL (ref 4.0–10.5)
nRBC: 0 % (ref 0.0–0.2)

## 2022-04-06 LAB — BASIC METABOLIC PANEL
Anion gap: 10 (ref 5–15)
BUN: 13 mg/dL (ref 8–23)
CO2: 26 mmol/L (ref 22–32)
Calcium: 9 mg/dL (ref 8.9–10.3)
Chloride: 93 mmol/L — ABNORMAL LOW (ref 98–111)
Creatinine, Ser: 0.91 mg/dL (ref 0.61–1.24)
GFR, Estimated: >60 mL/min (ref >60)
Glucose, Bld: 97 mg/dL (ref 70–99)
Potassium: 4 mmol/L (ref 3.5–5.1)
Sodium: 129 mmol/L — ABNORMAL LOW (ref 135–145)

## 2022-04-06 LAB — PROTIME-INR
INR: 1 (ref 0.8–1.2)
Prothrombin Time: 13.5 seconds (ref 11.4–15.2)

## 2022-04-06 LAB — SURGICAL PCR SCREEN
MRSA, PCR: NEGATIVE
Staphylococcus aureus: NEGATIVE

## 2022-04-06 LAB — TYPE AND SCREEN
ABO/RH(D): A POS
Antibody Screen: NEGATIVE

## 2022-04-06 NOTE — Progress Notes (Signed)
PCP - Cyndi Bender PA @ [facility] in [place] with [facility] cardiac clearance in Epic   PPM/ICD - Denies  Chest x-ray - Ni EKG - 03/26/22 Stress Test - 04/10/14 ECHO - Denies Cardiac Cath - 2015 was cleared no stent  Sleep Study - Yes had one done has very mild OSA no CPAP required   DM - Denies  Blood Thinner Instructions:Denies Aspirin Instructions:Per patient he is holding 5 days prior to surgery  ERAS Protcol -yes   COVID TEST- NI   Anesthesia review: Yes just saw cardiology for clearance   Patient denies shortness of breath, fever, cough and chest pain at PAT appointment   All instructions explained to the patient, with a verbal understanding of the material. Patient agrees to go over the instructions while at home for a better understanding. The opportunity to ask questions was provided.

## 2022-04-07 NOTE — Progress Notes (Signed)
Anesthesia Chart Review:  Case: [id number] Date/Time: 04/14/22 0945   Procedure: Posterior lateral fusion - L1 - L3, extension of instrumentation L1-3, exploration of fusion (Back)   Anesthesia type: General   Pre-op diagnosis: pseudoarthrosis   Location: [facility] OR ROOM 18 / [facility]   Surgeons: [name], MD       DISCUSSION: Patient is a 71 year old male scheduled for the above procedure.   History includes former smoker (quit 06/01/71), post-operative N/V, spinal headache, childhood murmur, non-obstructive CAD (minimal CAD by 2015, work-up for atypical chest pain), HTN, HLD, GERD, exertional dyspnea, spinal surgery (L3-5 laminectomy 11/19/00 with redo 03/15/12; lumbar spinal cord stimulator 05/29/14; L5-S1 ALIF 09/14/19; L2-3 PLIF, removal nonsegmental instrumentation L3-4, removal spinal cored stimulator 09/14/21; spinal cord stimulator insertion via T10 laminectomy 10/07/21), osteoarthritis (right TKA 02/08/19). He reported "very mild OSA" with no CPAP prescribed. BMI is consistent with obesity.   Preoperative cardiologist evaluation by Mayra Reel, NP on 03/26/22. She wrote, "Preoperative cardiovascular exam. The patient does not have any unstable cardiac conditions.  Upon evaluation today, he can achieve 4 METs or greater without anginal symptoms.  According to [facility] and AHA guidelines, he requires no further cardiac workup prior to his noncardiac surgery and should be at acceptable risk.  Our service is available as necessary in the perioperative period." 12 month follow-up planned.   He reported instructions to hold ASA for 5 days prior to surgery.  Preoperative labs showed Na 129, previously 135. He is on lisinopril-HCTZ which could be contributing to hyponatremia. His alcohol intake is documented as 0-3 beers/week and 2-3 shots of liquor/week. Na results called to Lorriane Shire at Dr. Ronnald Ramp' office. Will order an iSTAT for the day of surgery to check for stability, otherwise at this time will defer  additional recommendations, if any to surgeon.  Anesthesia team to evaluate on the day of surgery.    VS: BP (!) 154/77   Pulse (!) 58   Temp 36.5 C   Resp 17   Ht '6\' 1"'$  (1.854 m)   Wt 120.7 kg   SpO2 96%   BMI 35.12 kg/m   PROVIDERS: Cyndi Bender, PA-C is PCP [facility]) Ida Rogue, MD is cardiologist   LABS: Preoperative labs noted. See DISCUSSION. (all labs ordered are listed, but only abnormal results are displayed)  Labs Reviewed  BASIC METABOLIC PANEL - Abnormal; Notable for the following components:      Result Value   Sodium 129 (*)    Chloride 93 (*)    All other components within normal limits  CBC - Abnormal; Notable for the following components:   Hemoglobin 17.2 (*)    All other components within normal limits  SURGICAL PCR SCREEN  PROTIME-INR  TYPE AND SCREEN    IMAGES: CT L-spine 03/08/22: IMPRESSION: 1. Status post posterior fixation L2-S1 with unchanged loosening about the L2 pedicle screws and reactive changes about the L2-L3 disc spacer. 2. Unchanged mild bilateral neural foraminal narrowing at L2-L3, L3-L4, L4-L5, and L5-S1. Unchanged mild-to-moderate spinal canal stenosis L3-L4 with narrowing of the lateral recesses. 3. Transitional anatomy with lumbarization of S1. Please correlate with imaging if any intervention is planned.   CT C-spine 01/18/22: IMPRESSION: No acute or traumatic finding. Cervical degenerative changes as above. Spondylosis with bilateral foraminal narrowing at C5-6 and left foraminal narrowing at C6-7.   EKG: 03/26/2022: Normal sinus rhythm   CV: Cath 03/04/2014 (done for mild reversibility lateral wall 02/25/14 Lexiscan): Final Conclusions:   1. No  significant CAD 2. Normal LV function.   Past Medical History:  Diagnosis Date   Allergy    Arthritis    mainly in back   Back pain    Balance disorder    uses cane daily   Blood in urine    Chest pain    Constipation    Diverticulosis  02/14/2018   noted on colonoscopy   DOE (dyspnea on exertion)    Environmental allergies    Foot swelling    Bilateral   GERD (gastroesophageal reflux disease)    Headache(784.0)    occas.  migraines  (from last surgery)   Heart murmur    as child....Marland Kitchenno problems as an adult   History of colon polyps 02/14/2018   Hyperlipidemia    Hypertension    dx  10 yrs or more  ago   Insomnia    Neck pain    Neuromuscular disorder (HCC)    bilateral feet   Night sweats    Nonobstructive CAD (coronary artery disease)    a. 01/2014 MV: mild lateral ischemia. EF 56%; b. 02/2014 Cath: LM nl, LAD nl, LCX min irregs/<10%m, RCA mild ectasia mid vessel, otw nl, EF 55-65%.   Painful swelling of joint    PONV (postoperative nausea and vomiting)    Poor circulation of extremity    legs   Sinus disorder    Spinal headache    Vertigo     Past Surgical History:  Procedure Laterality Date   ABDOMINAL EXPOSURE N/A 09/14/2019   Procedure: ABDOMINAL EXPOSURE;  Surgeon: [name], MD;  Location: [place];  Service: Vascular;  Laterality: N/A;   ANTERIOR LAT LUMBAR FUSION Right 09/14/2019   Procedure: Right Lumbar three-four Lumbar four-five  Anterolateral lumbar interbody fusion;  Surgeon: Erline Levine, MD;  Location: [place];  Service: Neurosurgery;  Laterality: Right;   ANTERIOR LUMBAR FUSION N/A 09/14/2019   Procedure: Lumbar five Sacral one Anterior lumbar interbody fusion;  Surgeon: Erline Levine, MD;  Location: [place];  Service: Neurosurgery;  Laterality: N/A;   BACK SURGERY     CARDIAC CATHETERIZATION     03/04/14   COLONOSCOPY     FRACTURE SURGERY     left arm   KNEE SURGERY Right 01/02/2018   torn minicus   LEFT HEART CATHETERIZATION WITH CORONARY ANGIOGRAM N/A 03/04/2014   Procedure: LEFT HEART CATHETERIZATION WITH CORONARY ANGIOGRAM;  Surgeon: Peter M Martinique, MD;  Location: [facility] CATH LAB;  Service: Cardiovascular;  Laterality: N/A;   LUMBAR LAMINECTOMY/DECOMPRESSION MICRODISCECTOMY   03/15/2012   Procedure: LUMBAR LAMINECTOMY/DECOMPRESSION MICRODISCECTOMY 2 LEVELS;  Surgeon: Floyce Stakes, MD;  Location: [place] NEURO ORS;  Service: Neurosurgery;  Laterality: Bilateral;  Bilateral Lumbar three, lumbar four, lumbar five Laminectomy   LUMBAR PERCUTANEOUS PEDICLE SCREW 3 LEVEL N/A 09/14/2019   Procedure: Lumbar three to Sacral one posterior pedicle screw fixation;  Surgeon: Erline Levine, MD;  Location: [place];  Service: Neurosurgery;  Laterality: N/A;   POLYPECTOMY     SPINAL CORD STIMULATOR BATTERY EXCHANGE N/A 09/14/2021   Procedure: Removal of spinal cord stimulator leads;  Surgeon: [name], MD;  Location: [place];  Service: Neurosurgery;  Laterality: N/A;   SPINAL CORD STIMULATOR INSERTION N/A 05/29/2014   Procedure: LUMBAR SPINAL CORD STIMULATOR INSERTION;  Surgeon: Bonna Gains, MD;  Location: [facility] NEURO [facility]S;  Service: Neurosurgery;  Laterality: N/A;   SPINAL CORD STIMULATOR INSERTION N/A 10/07/2021   Procedure: Spinal cord stimulator via Thoracic nine laminectomy;  Surgeon: Sherley Bounds  S, MD;  Location: [place];  Service: Neurosurgery;  Laterality: N/A;   TOTAL KNEE ARTHROPLASTY Right 02/08/2019   Procedure: TOTAL KNEE ARTHROPLASTY;  Surgeon: Paralee Cancel, MD;  Location: [facility]S;  Service: Orthopedics;  Laterality: Right;  70 mins    MEDICATIONS:  acetaminophen (TYLENOL) 500 MG tablet   aspirin 81 MG chewable tablet   cetirizine (ZYRTEC) 10 MG tablet   famotidine (PEPCID) 20 MG tablet   hydrOXYzine (ATARAX) 25 MG tablet   lisinopril-hydrochlorothiazide (ZESTORETIC) 10-12.5 MG tablet   methocarbamol (ROBAXIN) 750 MG tablet   morphine (MS CONTIN) 30 MG 12 hr tablet   nitroGLYCERIN (NITROSTAT) 0.4 MG SL tablet   nystatin cream (MYCOSTATIN)   oxyCODONE (ROXICODONE) 15 MG immediate release tablet   polyethylene glycol (MIRALAX / GLYCOLAX) 17 g packet   pravastatin (PRAVACHOL) 80 MG tablet   pregabalin (LYRICA) 75 MG capsule   sildenafil (VIAGRA) 100 MG tablet    testosterone cypionate (DEPOTESTOSTERONE CYPIONATE) 200 MG/ML injection   Vitamin D, Ergocalciferol, (DRISDOL) 50000 UNITS CAPS capsule   zolpidem (AMBIEN) 10 MG tablet   No current facility-administered medications for this encounter.    Myra Gianotti, PA-C Surgical Short Stay/Anesthesiology [facility] Phone [phone] [facility] Phone [phone] 04/07/2022 4:33 PM

## 2022-04-14 ENCOUNTER — Inpatient Hospital Stay (HOSPITAL_COMMUNITY): Payer: Medicare Other

## 2022-04-14 ENCOUNTER — Other Ambulatory Visit: Payer: Self-pay

## 2022-04-14 ENCOUNTER — Encounter (HOSPITAL_COMMUNITY)
Admission: RE | Disposition: A | Discharge status: Home Health | Payer: Self-pay | Source: Ambulatory Visit | Attending: Neurological Surgery

## 2022-04-14 ENCOUNTER — Encounter (HOSPITAL_COMMUNITY): Payer: Self-pay | Admitting: Neurological Surgery

## 2022-04-14 ENCOUNTER — Inpatient Hospital Stay (HOSPITAL_COMMUNITY): Payer: Medicare Other | Admitting: Vascular Surgery

## 2022-04-14 ENCOUNTER — Inpatient Hospital Stay (HOSPITAL_COMMUNITY)
Admission: RE | Admit: 2022-04-14 | Discharge: 2022-04-16 | DRG: 460 | Disposition: A | Discharge status: Home Health | Payer: Medicare Other | Source: Ambulatory Visit | Attending: Neurological Surgery | Admitting: Neurological Surgery

## 2022-04-14 DIAGNOSIS — Z7982 Long term (current) use of aspirin: Secondary | ICD-10-CM

## 2022-04-14 DIAGNOSIS — Z79891 Long term (current) use of opiate analgesic: Secondary | ICD-10-CM | POA: Diagnosis not present

## 2022-04-14 DIAGNOSIS — Z885 Allergy status to narcotic agent status: Secondary | ICD-10-CM | POA: Diagnosis not present

## 2022-04-14 DIAGNOSIS — Z888 Allergy status to other drugs, medicaments and biological substances status: Secondary | ICD-10-CM

## 2022-04-14 DIAGNOSIS — Z981 Arthrodesis status: Secondary | ICD-10-CM

## 2022-04-14 DIAGNOSIS — M960 Pseudarthrosis after fusion or arthrodesis: Secondary | ICD-10-CM

## 2022-04-14 DIAGNOSIS — Z9682 Presence of neurostimulator: Secondary | ICD-10-CM

## 2022-04-14 DIAGNOSIS — Y838 Other surgical procedures as the cause of abnormal reaction of the patient, or of later complication, without mention of misadventure at the time of the procedure: Secondary | ICD-10-CM | POA: Diagnosis present

## 2022-04-14 DIAGNOSIS — Z87891 Personal history of nicotine dependence: Secondary | ICD-10-CM | POA: Diagnosis not present

## 2022-04-14 DIAGNOSIS — T84296A Other mechanical complication of internal fixation device of vertebrae, initial encounter: Secondary | ICD-10-CM

## 2022-04-14 DIAGNOSIS — Z79899 Other long term (current) drug therapy: Secondary | ICD-10-CM | POA: Diagnosis not present

## 2022-04-14 DIAGNOSIS — Z01818 Encounter for other preprocedural examination: Secondary | ICD-10-CM

## 2022-04-14 DIAGNOSIS — E871 Hypo-osmolality and hyponatremia: Principal | ICD-10-CM

## 2022-04-14 DIAGNOSIS — Z96651 Presence of right artificial knee joint: Secondary | ICD-10-CM | POA: Diagnosis present

## 2022-04-14 DIAGNOSIS — Z8249 Family history of ischemic heart disease and other diseases of the circulatory system: Secondary | ICD-10-CM | POA: Diagnosis not present

## 2022-04-14 DIAGNOSIS — K219 Gastro-esophageal reflux disease without esophagitis: Secondary | ICD-10-CM | POA: Diagnosis present

## 2022-04-14 DIAGNOSIS — Z91048 Other nonmedicinal substance allergy status: Secondary | ICD-10-CM | POA: Diagnosis not present

## 2022-04-14 HISTORY — PX: LAMINECTOMY WITH POSTERIOR LATERAL ARTHRODESIS LEVEL 2: SHX6336

## 2022-04-14 LAB — POCT I-STAT, CHEM 8
BUN: 18 mg/dL (ref 8–23)
Calcium, Ion: 1.1 mmol/L — ABNORMAL LOW (ref 1.15–1.40)
Chloride: 97 mmol/L — ABNORMAL LOW (ref 98–111)
Creatinine, Ser: 0.8 mg/dL (ref 0.61–1.24)
Glucose, Bld: 101 mg/dL — ABNORMAL HIGH (ref 70–99)
HCT: 53 % — ABNORMAL HIGH (ref 39.0–52.0)
Hemoglobin: 18 g/dL — ABNORMAL HIGH (ref 13.0–17.0)
Potassium: 4.1 mmol/L (ref 3.5–5.1)
Sodium: 134 mmol/L — ABNORMAL LOW (ref 135–145)
TCO2: 27 mmol/L (ref 22–32)

## 2022-04-14 SURGERY — LAMINECTOMY WITH POSTERIOR LATERAL ARTHRODESIS LEVEL 2
Anesthesia: General | Site: Back

## 2022-04-14 MED ORDER — HYDROCHLOROTHIAZIDE 12.5 MG PO TABS
12.5000 mg | ORAL_TABLET | Freq: Every day | ORAL | Status: DC
  Administered 2022-04-14 – 2022-04-15 (×2): 12.5 mg via ORAL
  Filled 2022-04-14 (×2): qty 1

## 2022-04-14 MED ORDER — ROCURONIUM BROMIDE 10 MG/ML (PF) SYRINGE
PREFILLED_SYRINGE | INTRAVENOUS | Status: DC | PRN
  Administered 2022-04-14: 100 mg via INTRAVENOUS

## 2022-04-14 MED ORDER — THROMBIN 5000 UNITS EX SOLR: OROMUCOSAL | Status: DC | PRN

## 2022-04-14 MED ORDER — LISINOPRIL-HYDROCHLOROTHIAZIDE 10-12.5 MG PO TABS: 1.0000 | ORAL_TABLET | Freq: Every day | ORAL | Status: DC

## 2022-04-14 MED ORDER — CHLORHEXIDINE GLUCONATE 0.12 % MT SOLN
15.0000 mL | Freq: Once | OROMUCOSAL | Status: AC
  Administered 2022-04-14: 15 mL via OROMUCOSAL

## 2022-04-14 MED ORDER — OXYCODONE HCL 5 MG PO TABS
15.0000 mg | ORAL_TABLET | ORAL | Status: DC | PRN
  Administered 2022-04-14 – 2022-04-16 (×6): 15 mg via ORAL
  Filled 2022-04-14 (×7): qty 3

## 2022-04-14 MED ORDER — 0.9 % SODIUM CHLORIDE (POUR BTL) OPTIME
TOPICAL | Status: DC | PRN
  Administered 2022-04-14 (×2): 1000 mL

## 2022-04-14 MED ORDER — CHLORHEXIDINE GLUCONATE CLOTH 2 % EX PADS: 6.0000 | MEDICATED_PAD | Freq: Once | CUTANEOUS | Status: DC

## 2022-04-14 MED ORDER — ACETAMINOPHEN 500 MG PO TABS
1000.0000 mg | ORAL_TABLET | Freq: Four times a day (QID) | ORAL | Status: AC
  Administered 2022-04-14 – 2022-04-15 (×4): 1000 mg via ORAL
  Filled 2022-04-14 (×4): qty 2

## 2022-04-14 MED ORDER — KETAMINE HCL 10 MG/ML IJ SOLN
INTRAMUSCULAR | Status: DC | PRN
  Administered 2022-04-14: 40 mg via INTRAVENOUS
  Administered 2022-04-14: 10 mg via INTRAVENOUS

## 2022-04-14 MED ORDER — POLYETHYLENE GLYCOL 3350 17 G PO PACK
17.0000 g | PACK | Freq: Two times a day (BID) | ORAL | Status: DC
  Administered 2022-04-14 – 2022-04-15 (×3): 17 g via ORAL
  Filled 2022-04-14 (×3): qty 1

## 2022-04-14 MED ORDER — HYDROMORPHONE HCL 1 MG/ML IJ SOLN
INTRAMUSCULAR | Status: AC
  Filled 2022-04-14: qty 1

## 2022-04-14 MED ORDER — ONDANSETRON HCL 4 MG/2ML IJ SOLN
INTRAMUSCULAR | Status: DC | PRN
  Administered 2022-04-14: 4 mg via INTRAVENOUS

## 2022-04-14 MED ORDER — VANCOMYCIN HCL 1000 MG IV SOLR
INTRAVENOUS | Status: DC | PRN
  Administered 2022-04-14: 1000 mg via TOPICAL

## 2022-04-14 MED ORDER — MENTHOL 3 MG MT LOZG: 1.0000 | LOZENGE | OROMUCOSAL | Status: DC | PRN

## 2022-04-14 MED ORDER — CEFAZOLIN SODIUM-DEXTROSE 2-4 GM/100ML-% IV SOLN
2.0000 g | Freq: Three times a day (TID) | INTRAVENOUS | Status: AC
  Administered 2022-04-14 – 2022-04-15 (×2): 2 g via INTRAVENOUS
  Filled 2022-04-14 (×2): qty 100

## 2022-04-14 MED ORDER — ONDANSETRON HCL 4 MG PO TABS: 4.0000 mg | ORAL_TABLET | Freq: Four times a day (QID) | ORAL | Status: DC | PRN

## 2022-04-14 MED ORDER — HYDROMORPHONE HCL 1 MG/ML IJ SOLN
INTRAMUSCULAR | Status: AC
  Filled 2022-04-14: qty 0.5

## 2022-04-14 MED ORDER — OXYCODONE HCL 5 MG/5ML PO SOLN: 5.0000 mg | Freq: Once | ORAL | Status: DC | PRN

## 2022-04-14 MED ORDER — PROPOFOL 10 MG/ML IV BOLUS
INTRAVENOUS | Status: AC
  Filled 2022-04-14: qty 20

## 2022-04-14 MED ORDER — PREGABALIN 75 MG PO CAPS
150.0000 mg | ORAL_CAPSULE | Freq: Two times a day (BID) | ORAL | Status: DC
  Administered 2022-04-14 – 2022-04-15 (×3): 150 mg via ORAL
  Filled 2022-04-14 (×3): qty 2

## 2022-04-14 MED ORDER — VANCOMYCIN HCL 1000 MG IV SOLR
INTRAVENOUS | Status: AC
  Filled 2022-04-14: qty 20

## 2022-04-14 MED ORDER — FAMOTIDINE 20 MG PO TABS
20.0000 mg | ORAL_TABLET | Freq: Every day | ORAL | Status: DC
  Administered 2022-04-15: 20 mg via ORAL
  Filled 2022-04-14: qty 1

## 2022-04-14 MED ORDER — SODIUM CHLORIDE 0.9 % IV SOLN
250.0000 mL | INTRAVENOUS | Status: DC
  Administered 2022-04-14: 250 mL via INTRAVENOUS

## 2022-04-14 MED ORDER — BUPIVACAINE HCL (PF) 0.25 % IJ SOLN
INTRAMUSCULAR | Status: DC | PRN
  Administered 2022-04-14 (×2): 10 mL

## 2022-04-14 MED ORDER — SUGAMMADEX SODIUM 200 MG/2ML IV SOLN
INTRAVENOUS | Status: DC | PRN
  Administered 2022-04-14: 200 mg via INTRAVENOUS

## 2022-04-14 MED ORDER — PROPOFOL 1000 MG/100ML IV EMUL
INTRAVENOUS | Status: AC
  Filled 2022-04-14: qty 100

## 2022-04-14 MED ORDER — EPHEDRINE SULFATE-NACL 50-0.9 MG/10ML-% IV SOSY
PREFILLED_SYRINGE | INTRAVENOUS | Status: DC | PRN
  Administered 2022-04-14: 5 mg via INTRAVENOUS

## 2022-04-14 MED ORDER — METHOCARBAMOL 750 MG PO TABS
750.0000 mg | ORAL_TABLET | Freq: Three times a day (TID) | ORAL | Status: DC
  Administered 2022-04-14 – 2022-04-16 (×5): 750 mg via ORAL
  Filled 2022-04-14 (×6): qty 1

## 2022-04-14 MED ORDER — LACTATED RINGERS IV SOLN: INTRAVENOUS | Status: DC

## 2022-04-14 MED ORDER — HYDROXYZINE HCL 25 MG PO TABS
25.0000 mg | ORAL_TABLET | Freq: Every day | ORAL | Status: DC
  Administered 2022-04-14 – 2022-04-15 (×2): 25 mg via ORAL
  Filled 2022-04-14 (×2): qty 1

## 2022-04-14 MED ORDER — AMISULPRIDE (ANTIEMETIC) 5 MG/2ML IV SOLN: 10.0000 mg | Freq: Once | INTRAVENOUS | Status: DC | PRN

## 2022-04-14 MED ORDER — PREGABALIN 75 MG PO CAPS
150.0000 mg | ORAL_CAPSULE | Freq: Two times a day (BID) | ORAL | Status: DC
  Administered 2022-04-14: 150 mg via ORAL
  Filled 2022-04-14: qty 2

## 2022-04-14 MED ORDER — BUPIVACAINE HCL (PF) 0.25 % IJ SOLN
INTRAMUSCULAR | Status: AC
  Filled 2022-04-14: qty 30

## 2022-04-14 MED ORDER — CEFAZOLIN IN SODIUM CHLORIDE 3-0.9 GM/100ML-% IV SOLN
3.0000 g | INTRAVENOUS | Status: AC
  Administered 2022-04-14: 3 g via INTRAVENOUS
  Filled 2022-04-14: qty 100

## 2022-04-14 MED ORDER — THROMBIN 20000 UNITS EX SOLR: CUTANEOUS | Status: DC | PRN

## 2022-04-14 MED ORDER — HYDROMORPHONE HCL 1 MG/ML IJ SOLN: 1.0000 mg | INTRAMUSCULAR | Status: DC | PRN

## 2022-04-14 MED ORDER — ASPIRIN 81 MG PO CHEW
81.0000 mg | CHEWABLE_TABLET | Freq: Every day | ORAL | Status: DC
  Administered 2022-04-14 – 2022-04-15 (×2): 81 mg via ORAL
  Filled 2022-04-14 (×2): qty 1

## 2022-04-14 MED ORDER — LISINOPRIL 10 MG PO TABS
10.0000 mg | ORAL_TABLET | Freq: Every day | ORAL | Status: DC
  Administered 2022-04-14 – 2022-04-15 (×2): 10 mg via ORAL
  Filled 2022-04-14 (×2): qty 1

## 2022-04-14 MED ORDER — PROMETHAZINE HCL 25 MG/ML IJ SOLN: 6.2500 mg | INTRAMUSCULAR | Status: DC | PRN

## 2022-04-14 MED ORDER — PREGABALIN 75 MG PO CAPS
75.0000 mg | ORAL_CAPSULE | Freq: Every day | ORAL | Status: DC
  Administered 2022-04-15 – 2022-04-16 (×2): 75 mg via ORAL
  Filled 2022-04-14 (×2): qty 1

## 2022-04-14 MED ORDER — ONDANSETRON HCL 4 MG/2ML IJ SOLN: 4.0000 mg | Freq: Four times a day (QID) | INTRAMUSCULAR | Status: DC | PRN

## 2022-04-14 MED ORDER — SENNA 8.6 MG PO TABS
1.0000 | ORAL_TABLET | Freq: Two times a day (BID) | ORAL | Status: DC
  Administered 2022-04-14 – 2022-04-15 (×3): 8.6 mg via ORAL
  Filled 2022-04-14 (×3): qty 1

## 2022-04-14 MED ORDER — LIDOCAINE 2% (20 MG/ML) 5 ML SYRINGE
INTRAMUSCULAR | Status: DC | PRN
  Administered 2022-04-14: 100 mg via INTRAVENOUS

## 2022-04-14 MED ORDER — SODIUM CHLORIDE 0.9% FLUSH: 3.0000 mL | INTRAVENOUS | Status: DC | PRN

## 2022-04-14 MED ORDER — SODIUM CHLORIDE 0.9% FLUSH
3.0000 mL | Freq: Two times a day (BID) | INTRAVENOUS | Status: DC
  Administered 2022-04-14: 3 mL via INTRAVENOUS

## 2022-04-14 MED ORDER — OXYCODONE HCL 5 MG PO TABS: 5.0000 mg | ORAL_TABLET | Freq: Once | ORAL | Status: DC | PRN

## 2022-04-14 MED ORDER — KETAMINE HCL 50 MG/5ML IJ SOSY
PREFILLED_SYRINGE | INTRAMUSCULAR | Status: AC
  Filled 2022-04-14: qty 5

## 2022-04-14 MED ORDER — MORPHINE SULFATE ER 15 MG PO TBCR
30.0000 mg | EXTENDED_RELEASE_TABLET | Freq: Two times a day (BID) | ORAL | Status: DC
  Administered 2022-04-14 – 2022-04-15 (×3): 30 mg via ORAL
  Filled 2022-04-14 (×3): qty 2

## 2022-04-14 MED ORDER — ORAL CARE MOUTH RINSE: 15.0000 mL | Freq: Once | OROMUCOSAL | Status: AC

## 2022-04-14 MED ORDER — PHENOL 1.4 % MT LIQD: 1.0000 | OROMUCOSAL | Status: DC | PRN

## 2022-04-14 MED ORDER — HYDROMORPHONE HCL 1 MG/ML IJ SOLN
0.2500 mg | INTRAMUSCULAR | Status: DC | PRN
  Administered 2022-04-14 (×4): 0.5 mg via INTRAVENOUS

## 2022-04-14 MED ORDER — FENTANYL CITRATE (PF) 250 MCG/5ML IJ SOLN
INTRAMUSCULAR | Status: DC | PRN
  Administered 2022-04-14: 100 ug via INTRAVENOUS
  Administered 2022-04-14: 50 ug via INTRAVENOUS

## 2022-04-14 MED ORDER — THROMBIN 5000 UNITS EX SOLR
CUTANEOUS | Status: AC
  Filled 2022-04-14: qty 5000

## 2022-04-14 MED ORDER — THROMBIN 20000 UNITS EX SOLR
CUTANEOUS | Status: AC
  Filled 2022-04-14: qty 20000

## 2022-04-14 MED ORDER — PROPOFOL 10 MG/ML IV BOLUS
INTRAVENOUS | Status: DC | PRN
  Administered 2022-04-14: 200 mg via INTRAVENOUS

## 2022-04-14 MED ORDER — FENTANYL CITRATE (PF) 250 MCG/5ML IJ SOLN
INTRAMUSCULAR | Status: AC
  Filled 2022-04-14: qty 5

## 2022-04-14 MED ORDER — POTASSIUM CHLORIDE IN NACL 20-0.9 MEQ/L-% IV SOLN: INTRAVENOUS | Status: DC

## 2022-04-14 MED ORDER — NITROGLYCERIN 0.4 MG SL SUBL: 0.4000 mg | SUBLINGUAL_TABLET | SUBLINGUAL | Status: DC | PRN

## 2022-04-14 MED ORDER — HYDROMORPHONE HCL 1 MG/ML IJ SOLN
INTRAMUSCULAR | Status: DC | PRN
  Administered 2022-04-14: .5 mg via INTRAVENOUS

## 2022-04-14 MED ORDER — CELECOXIB 200 MG PO CAPS
200.0000 mg | ORAL_CAPSULE | Freq: Two times a day (BID) | ORAL | Status: DC
  Administered 2022-04-14 – 2022-04-15 (×3): 200 mg via ORAL
  Filled 2022-04-14 (×3): qty 1

## 2022-04-14 SURGICAL SUPPLY — 66 items
ADH SKN CLS APL DERMABOND .7 (GAUZE/BANDAGES/DRESSINGS) ×1
APL SKNCLS STERI-STRIP NONHPOA (GAUZE/BANDAGES/DRESSINGS) ×1
BAG COUNTER SPONGE SURGICOUNT (BAG) ×2 IMPLANT
BAG SPNG CNTER NS LX DISP (BAG) ×1
BASKET BONE COLLECTION (BASKET) IMPLANT
BENZOIN TINCTURE PRP APPL 2/3 (GAUZE/BANDAGES/DRESSINGS) ×2 IMPLANT
BLADE CLIPPER SURG (BLADE) IMPLANT
BONE CANC CHIPS 20CC PCAN1/4 (Bone Implant) ×1 IMPLANT
BONE CHIP PRESERV 40CC PCAN1/2 (Bone Implant) ×1 IMPLANT
BONE MATRIX OSTEOCEL PRO LRG (Bone Implant) IMPLANT
BUR CARBIDE MATCH 3.0 (BURR) ×2 IMPLANT
CANISTER SUCT 3000ML PPV (MISCELLANEOUS) ×2 IMPLANT
CHIPS CANC BONE 20CC PCAN1/4 (Bone Implant) ×1 IMPLANT
CNTNR URN SCR LID CUP LEK RST (MISCELLANEOUS) ×2 IMPLANT
CONT SPEC 4OZ STRL OR WHT (MISCELLANEOUS) ×1
COVER BACK TABLE 60X90IN (DRAPES) ×2 IMPLANT
DERMABOND ADVANCED .7 DNX12 (GAUZE/BANDAGES/DRESSINGS) IMPLANT
DRAPE C-ARM 42X72 X-RAY (DRAPES) IMPLANT
DRAPE C-ARMOR (DRAPES) IMPLANT
DRAPE LAPAROTOMY 100X72X124 (DRAPES) ×2 IMPLANT
DRAPE SURG 17X23 STRL (DRAPES) ×2 IMPLANT
DRSG OPSITE POSTOP 4X10 (GAUZE/BANDAGES/DRESSINGS) IMPLANT
DRSG OPSITE POSTOP 4X8 (GAUZE/BANDAGES/DRESSINGS) IMPLANT
DURAPREP 26ML APPLICATOR (WOUND CARE) ×2 IMPLANT
ELECT REM PT RETURN 9FT ADLT (ELECTROSURGICAL) ×1
ELECTRODE REM PT RTRN 9FT ADLT (ELECTROSURGICAL) ×2 IMPLANT
EVACUATOR 1/8 PVC DRAIN (DRAIN) IMPLANT
GAUZE 4X4 16PLY ~~LOC~~+RFID DBL (SPONGE) IMPLANT
GLOVE BIO SURGEON STRL SZ7 (GLOVE) IMPLANT
GLOVE BIO SURGEON STRL SZ8 (GLOVE) ×4 IMPLANT
GLOVE BIOGEL PI IND STRL 7.0 (GLOVE) IMPLANT
GLOVE BIOGEL PI IND STRL 7.5 (GLOVE) IMPLANT
GLOVE ECLIPSE 7.0 STRL STRAW (GLOVE) IMPLANT
GOWN STRL REUS W/ TWL LRG LVL3 (GOWN DISPOSABLE) IMPLANT
GOWN STRL REUS W/ TWL XL LVL3 (GOWN DISPOSABLE) ×4 IMPLANT
GOWN STRL REUS W/TWL 2XL LVL3 (GOWN DISPOSABLE) IMPLANT
GOWN STRL REUS W/TWL LRG LVL3 (GOWN DISPOSABLE) ×1
GOWN STRL REUS W/TWL XL LVL3 (GOWN DISPOSABLE) ×3
GRAFT BNE CANC CHIPS 1-8 20CC (Bone Implant) IMPLANT
GRAFT BNE CANC CHIPS 1-8 40CC (Bone Implant) IMPLANT
GRAFT BONE PROTEIOS LRG 5CC (Orthopedic Implant) IMPLANT
HEMOSTAT POWDER KIT SURGIFOAM (HEMOSTASIS) IMPLANT
KIT BASIN OR (CUSTOM PROCEDURE TRAY) ×2 IMPLANT
KIT TURNOVER KIT B (KITS) ×2 IMPLANT
MILL MEDIUM DISP (BLADE) IMPLANT
NDL HYPO 25X1 1.5 SAFETY (NEEDLE) ×2 IMPLANT
NEEDLE HYPO 25X1 1.5 SAFETY (NEEDLE) ×1 IMPLANT
NS IRRIG 1000ML POUR BTL (IV SOLUTION) ×2 IMPLANT
PACK LAMINECTOMY NEURO (CUSTOM PROCEDURE TRAY) ×2 IMPLANT
PAD ARMBOARD 7.5X6 YLW CONV (MISCELLANEOUS) ×6 IMPLANT
ROD RELINE 5.5X90MM LORDOTIC (Rod) IMPLANT
ROD RELINE LORD 5.5X80MM (Rod) IMPLANT
SCREW LOCK RELINE 5.5 TULIP (Screw) IMPLANT
SCREW RELINE O POLY 8.5X50MM (Screw) IMPLANT
SCREW RELINE-O 6.5X55 POLY (Screw) IMPLANT
SPONGE SURGIFOAM ABS GEL 100 (HEMOSTASIS) ×2 IMPLANT
SPONGE T-LAP 4X18 ~~LOC~~+RFID (SPONGE) IMPLANT
STRIP CLOSURE SKIN 1/2X4 (GAUZE/BANDAGES/DRESSINGS) ×4 IMPLANT
SUT VIC AB 0 CT1 18XCR BRD8 (SUTURE) ×2 IMPLANT
SUT VIC AB 0 CT1 8-18 (SUTURE) ×2
SUT VIC AB 2-0 CP2 18 (SUTURE) ×2 IMPLANT
SUT VIC AB 3-0 SH 8-18 (SUTURE) ×4 IMPLANT
TOWEL GREEN STERILE (TOWEL DISPOSABLE) ×2 IMPLANT
TOWEL GREEN STERILE FF (TOWEL DISPOSABLE) ×2 IMPLANT
TRAY FOLEY MTR SLVR 16FR STAT (SET/KITS/TRAYS/PACK) IMPLANT
WATER STERILE IRR 1000ML POUR (IV SOLUTION) ×2 IMPLANT

## 2022-04-14 NOTE — Op Note (Signed)
04/14/2022  1:41 PM  PATIENT:  Darin Gomez  71 y.o. male  PRE-OPERATIVE DIAGNOSIS: Pseudoarthrosis with loosening of instrumentation L2-3 with back pain and leg pain  POST-OPERATIVE DIAGNOSIS:  same  PROCEDURE:    Lumbar reexploration with exploration of fusion L2-3 to confirm pseudoarthrosis and loosening of instrumentation, removal of instrumentation L2-3 2.  Segmental posterior fixation L1-L3 inclusive using NuVasive pedicle screws.  4. Intertransverse arthrodesis L1-L3 bilaterally using morcellized allograft.  SURGEON:  Sherley Bounds, MD  ASSISTANTS: Dr. Kathyrn Sheriff  ANESTHESIA:  General  EBL: 150 ml  Total I/O In: 800 [I.V.:700; IV Piggyback:100] Out: 150 [Blood:150]  BLOOD ADMINISTERED:none  DRAINS: bilateral medium Hemovac  INDICATION FOR PROCEDURE: This patient presented with back pain and leg pain. Imaging revealed pseudoarthrosis L2-3 with loosening of instrumentation seen after a fall. The patient tried a reasonable attempt at conservative medical measures without relief. I recommended decompression and instrumented fusion to address the stenosis as well as the segmental  instability.  Patient understood the risks, benefits, and alternatives and potential outcomes and wished to proceed.  PROCEDURE DETAILS:  The patient was brought to the operating room. After induction of generalized endotracheal anesthesia the patient was rolled into the prone position on chest rolls and all pressure points were padded. The patient's lumbar region was cleaned and then prepped with DuraPrep and draped in the usual sterile fashion. Anesthesia was injected and then a dorsal midline incision was made and carried down to the lumbosacral fascia. The fascia was opened and the paraspinous musculature was taken down in a subperiosteal fashion to expose L1-2 as well as the previously placed instrumentation at L2-3.  The L2-3 instrumentation was obviously loosened.  We remove the locking caps from  L2 and L3 and remove the rods.  The L2 screws were loosened but the L3 screws had good purchase.  We explored the fusion and found there to be motion at the level.  We remove the L2 pedicle screws.   We then turned our attention to the placement of the pedicle screws.  We palpated the old L2 pedicle holes with a ball probe and then placed 8.5 x 50 mm pedicle screws here.  They seemed to have pretty good purchase.  We then localized the pedicle screw entry zones of L1 with AP and lateral fluoroscopy, probed each pedicle with the pedicle probe, tapped each pedicle with a 5.5 tap and then placed 6.5 x 55 mm pedicle screws at L1.  We then decorticated the transverse processes of L1, L2 and L3 bilaterally and laid a mixture of morcellized  allograft out over these to perform intertransverse arthrodesis at L1-L3 bilaterally. We then placed lordotic rods into the multiaxial screw heads of the pedicle screws and locked these in position with the locking caps and anti-torque device. We then checked our construct with AP and lateral fluoroscopy. Irrigated with copious amounts of bacitracin-containing saline solution.  Placed bilateral medium Hemovac drains, used powdered vancomycin, and then we closed the muscle and the fascia with 0 Vicryl. Closed the subcutaneous tissues with 2-0 Vicryl and subcuticular tissues with 3-0 Vicryl. The skin was closed with benzoin and Steri-Strips. Dressing was then applied, the patient was awakened from general anesthesia and transported to the recovery room in stable condition. At the end of the procedure all sponge, needle and instrument counts were correct.   PLAN OF CARE: admit to inpatient  PATIENT DISPOSITION:  PACU - hemodynamically stable.   Delay start of Pharmacological VTE agent (>24hrs) due to  surgical blood loss or risk of bleeding:  yes

## 2022-04-14 NOTE — Anesthesia Postprocedure Evaluation (Signed)
Anesthesia Post Note  Patient: Darin Gomez  Procedure(s) Performed: Posterior lateral fusion - Lumbar one-three, extension of instrumentation Lumbar one-three, exploration of fusion (Back)     Patient location during evaluation: PACU Anesthesia Type: General Level of consciousness: awake and alert Pain management: pain level controlled Vital Signs Assessment: post-procedure vital signs reviewed and stable Respiratory status: spontaneous breathing, nonlabored ventilation and respiratory function stable Cardiovascular status: blood pressure returned to baseline and stable Postop Assessment: no apparent nausea or vomiting Anesthetic complications: no   No notable events documented.  Last Vitals:  Vitals:   04/14/22 1445 04/14/22 1512  BP: (!) 141/56 (!) 141/72  Pulse: 62 60  Resp: 12 18  Temp: 37.2 C 36.7 C  SpO2: 96% 97%    Last Pain:  Vitals:   04/14/22 1512  TempSrc:   PainSc: [place]

## 2022-04-14 NOTE — Anesthesia Preprocedure Evaluation (Signed)
Anesthesia Evaluation  Patient identified by MRN, date of birth, ID band Patient awake    Reviewed: Allergy & Precautions, NPO status , Patient's Chart, lab work & pertinent test results  History of Anesthesia Complications (+) PONV and history of anesthetic complications  Airway Mallampati: II  TM Distance: >3 FB Neck ROM: Full    Dental no notable dental hx.    Pulmonary neg pulmonary ROS, former smoker   Pulmonary exam normal breath sounds clear to auscultation       Cardiovascular hypertension, Pt. on medications + CAD, + Peripheral Vascular Disease and + DOE  Normal cardiovascular exam Rhythm:Regular Rate:Normal  '15 cath: Cardiac catheterization revealed only minor coronary artery irregularities   Neuro/Psych  Headaches Pain (post laminectomy syndrome) vertigo  Neuromuscular disease  negative psych ROS   GI/Hepatic Neg liver ROS,GERD  Medicated,,  Endo/Other  negative endocrine ROS    Renal/GU negative Renal ROS  negative genitourinary   Musculoskeletal  (+) Arthritis , Osteoarthritis,    Abdominal  (+) + obese  Peds negative pediatric ROS (+)  Hematology negative hematology ROS (+)   Anesthesia Other Findings   Reproductive/Obstetrics negative OB ROS                             Anesthesia Physical Anesthesia Plan  ASA: 2  Anesthesia Plan: General   Post-op Pain Management: Ofirmev IV (intra-op)*, Ketamine IV* and Dilaudid IV   Induction: Intravenous  PONV Risk Score and Plan: 3 and Ondansetron, Dexamethasone, Treatment may vary due to age or medical condition and Midazolam  Airway Management Planned: Oral ETT  Additional Equipment:   Intra-op Plan:   Post-operative Plan: Extubation in OR  Informed Consent: I have reviewed the patients History and Physical, chart, labs and discussed the procedure including the risks, benefits and alternatives for the proposed  anesthesia with the patient or authorized representative who has indicated his/her understanding and acceptance.     Dental advisory given  Plan Discussed with: CRNA and Surgeon  Anesthesia Plan Comments: (PAT note by Karoline Caldwell, PA-C: Follows with cardiology for history of atypical chest pain -no significant CAD by cath 2015.Seen by Ambrose Pancoast, NP via telemedicine visit for preop evaluation on 09/30/2021. Per note, "The patient affirmshehas been doing well without any new cardiac symptoms. They are able to achieve 4METS without cardiac limitations. Therefore, based on ACC/AHA guidelines, the patient would be at acceptable risk for the planned procedure without further cardiovascular testing. The patient was advised that if hedevelops new symptoms prior to surgery to contact our office to arrange for a follow-up visit, and heverbalized understanding. RCRI: 0.4% low risk. Patient stated that his surgeon requested that he hold his Asprin prior to his upcoming procedure.There is no contraindication from cardiology perspective for holding aspirin at this time"  Patient will need day of surgery labs and evaluation.  EKG 01/27/2021:NSR. Rate 76.  Cath 03/04/2014: Final Conclusions:  1. No significant CAD 2. Normal LV function. )        Anesthesia Quick Evaluation

## 2022-04-14 NOTE — Anesthesia Procedure Notes (Signed)
Procedure Name: Intubation Date/Time: 04/14/2022 10:59 AM  Performed by: Georgia Duff, CRNAPre-anesthesia Checklist: Patient identified, Emergency Drugs available, Suction available and Patient being monitored Patient Re-evaluated:Patient Re-evaluated prior to induction Oxygen Delivery Method: Circle System Utilized Preoxygenation: Pre-oxygenation with 100% oxygen Induction Type: IV induction Ventilation: Mask ventilation without difficulty Laryngoscope Size: Miller and 2 Grade View: Grade I Tube type: Oral Tube size: 7.5 mm Number of attempts: 1 Airway Equipment and Method: Stylet and Oral airway Placement Confirmation: ETT inserted through vocal cords under direct vision, positive ETCO2 and breath sounds checked- equal and bilateral Secured at: 22 cm Tube secured with: Tape Dental Injury: Teeth and Oropharynx as per pre-operative assessment

## 2022-04-14 NOTE — Anesthesia Postprocedure Evaluation (Signed)
Anesthesia Post Note  Patient: Darin Gomez  Procedure(s) Performed: Posterior lateral fusion - Lumbar one-three, extension of instrumentation Lumbar one-three, exploration of fusion (Back)     Patient location during evaluation: PACU Anesthesia Type: General Level of consciousness: awake and alert Pain management: pain level controlled Vital Signs Assessment: post-procedure vital signs reviewed and stable Respiratory status: spontaneous breathing, nonlabored ventilation and respiratory function stable Cardiovascular status: blood pressure returned to baseline and stable Postop Assessment: no apparent nausea or vomiting Anesthetic complications: no   No notable events documented.  Last Vitals:  Vitals:   04/14/22 1445 04/14/22 1512  BP: (!) 141/56 (!) 141/72  Pulse: 62 60  Resp: 12 18  Temp: 37.2 C 36.7 C  SpO2: 96% 97%    Last Pain:  Vitals:   04/14/22 1445  TempSrc:   PainSc: [place]

## 2022-04-14 NOTE — H&P (Signed)
Subjective: Patient is a 71 y.o. male admitted for pseudoarthrosis L2-3. Onset of symptoms was several months ago, gradually worsening since that time.  The pain is rated severe, and is located at the across the lower back and radiates to legs. The pain is described as aching and occurs all day. The symptoms have been progressive. Symptoms are exacerbated by exercise and standing. MRI or CT showed pseudoarthrosis L2-3   Past Medical History:  Diagnosis Date   Allergy    Arthritis    mainly in back   Back pain    Balance disorder    uses cane daily   Blood in urine    Chest pain    Constipation    Diverticulosis 02/14/2018   noted on colonoscopy   DOE (dyspnea on exertion)    Environmental allergies    Foot swelling    Bilateral   GERD (gastroesophageal reflux disease)    Headache(784.0)    occas.  migraines  (from last surgery)   Heart murmur    as child....Marland Kitchenno problems as an adult   History of colon polyps 02/14/2018   Hyperlipidemia    Hypertension    dx  10 yrs or more  ago   Insomnia    Neck pain    Neuromuscular disorder (HCC)    bilateral feet   Night sweats    Nonobstructive CAD (coronary artery disease)    a. 01/2014 MV: mild lateral ischemia. EF 56%; b. 02/2014 Cath: LM nl, LAD nl, LCX min irregs/<10%m, RCA mild ectasia mid vessel, otw nl, EF 55-65%.   Painful swelling of joint    PONV (postoperative nausea and vomiting)    Poor circulation of extremity    legs   Sinus disorder    Spinal headache    Vertigo     Past Surgical History:  Procedure Laterality Date   ABDOMINAL EXPOSURE N/A 09/14/2019   Procedure: ABDOMINAL EXPOSURE;  Surgeon: [name], MD;  Location: [place];  Service: Vascular;  Laterality: N/A;   ANTERIOR LAT LUMBAR FUSION Right 09/14/2019   Procedure: Right Lumbar three-four Lumbar four-five  Anterolateral lumbar interbody fusion;  Surgeon: Erline Levine, MD;  Location: [place];  Service: Neurosurgery;  Laterality: Right;   ANTERIOR  LUMBAR FUSION N/A 09/14/2019   Procedure: Lumbar five Sacral one Anterior lumbar interbody fusion;  Surgeon: Erline Levine, MD;  Location: [place];  Service: Neurosurgery;  Laterality: N/A;   BACK SURGERY     CARDIAC CATHETERIZATION     03/04/14   COLONOSCOPY     FRACTURE SURGERY     left arm   KNEE SURGERY Right 01/02/2018   torn minicus   LEFT HEART CATHETERIZATION WITH CORONARY ANGIOGRAM N/A 03/04/2014   Procedure: LEFT HEART CATHETERIZATION WITH CORONARY ANGIOGRAM;  Surgeon: Peter M Martinique, MD;  Location: [facility] C[facility] LAB;  Service: Cardiovascular;  Laterality: N/A;   LUMBAR LAMINECTOMY/DECOMPRESSION MICRODISCECTOMY  03/15/2012   Procedure: LUMBAR LAMINECTOMY/DECOMPRESSION MICRODISCECTOMY 2 LEVELS;  Surgeon: Floyce Stakes, MD;  Location: [place] [facility];  Service: Neurosurgery;  Laterality: Bilateral;  Bilateral Lumbar three, lumbar four, lumbar five Laminectomy   LUMBAR PERCUTANEOUS PEDICLE SCREW 3 LEVEL N/A 09/14/2019   Procedure: Lumbar three to Sacral one posterior pedicle screw fixation;  Surgeon: Erline Levine, MD;  Location: [place];  Service: Neurosurgery;  Laterality: N/A;   POLYPECTOMY     SPINAL CORD STIMULATOR BATTERY EXCHANGE N/A 09/14/2021   Procedure: Removal of spinal cord stimulator leads;  Surgeon: [name], MD;  Location:  [place] [facility];  Service: Neurosurgery;  Laterality: N/A;   SPINAL CORD STIMULATOR INSERTION N/A 05/29/2014   Procedure: LUMBAR SPINAL CORD STIMULATOR INSERTION;  Surgeon: Bonna Gains, MD;  Location: [facility];  Service: Neurosurgery;  Laterality: N/A;   SPINAL CORD STIMULATOR INSERTION N/A 10/07/2021   Procedure: Spinal cord stimulator via Thoracic nine laminectomy;  Surgeon: [name], MD;  Location: [place];  Service: Neurosurgery;  Laterality: N/A;   TOTAL KNEE ARTHROPLASTY Right 02/08/2019   Procedure: TOTAL KNEE ARTHROPLASTY;  Surgeon: Paralee Cancel, MD;  Location: [facility];  Service: Orthopedics;  Laterality: Right;  70 mins    Prior to Admission  medications   Medication Sig Start Date End Date Taking? Authorizing Provider  acetaminophen (TYLENOL) 500 MG tablet Take 2 tablets (1,000 mg total) by mouth every 8 (eight) hours. Patient taking differently: Take 1,000 mg by mouth every 6 (six) hours as needed for moderate pain. 02/09/19  Yes Danae Orleans, PA-C  aspirin 81 MG chewable tablet Chew 81 mg by mouth at bedtime.   Yes [provider]  cetirizine (ZYRTEC) 10 MG tablet Take 10 mg by mouth daily.    Yes [provider]  famotidine (PEPCID) 20 MG tablet Take 20 mg by mouth daily.   Yes [provider]  hydrOXYzine (ATARAX) 25 MG tablet Take 25 mg by mouth at bedtime.   Yes [provider]  lisinopril-hydrochlorothiazide (ZESTORETIC) 10-12.5 MG tablet Take 1 tablet by mouth daily. 01/27/21  Yes Gollan, Kathlene November, MD  methocarbamol (ROBAXIN) 750 MG tablet Take 750 mg by mouth 3 (three) times daily. 11/20/20  Yes [provider]  morphine (MS CONTIN) 30 MG 12 hr tablet Take 30 mg by mouth every 12 (twelve) hours.   Yes [provider]  oxyCODONE (ROXICODONE) 15 MG immediate release tablet Take 15 mg by mouth every 4 (four) hours as needed for pain.   Yes [provider]  polyethylene glycol (MIRALAX / GLYCOLAX) 17 g packet Take 17 g by mouth 2 (two) times daily. 02/09/19  Yes Babish, Rodman Key, PA-C  pravastatin (PRAVACHOL) 80 MG tablet Take 1 tablet (80 mg total) by mouth daily. 01/27/21  Yes Gollan, Kathlene November, MD  pregabalin (LYRICA) 75 MG capsule TAKE ONE CAPSULE BY MOUTH THREE TIMES DAILY  Patient taking differently: Take 75-150 mg by mouth See admin instructions. Take 75 mg by mouth in the morning and 150 mg in the afternoon and bedtime 12/13/18  Yes Lateef, Carlus Pavlov, MD  sildenafil (VIAGRA) 100 MG tablet Take 100 mg by mouth daily as needed for erectile dysfunction.   Yes [provider]  testosterone cypionate (DEPOTESTOSTERONE CYPIONATE) 200 MG/ML injection Inject 200 mg  into the muscle every 14 (fourteen) days. 08/21/19  Yes [provider]  Vitamin D, Ergocalciferol, (DRISDOL) 50000 UNITS CAPS capsule Take 50,000 Units by mouth 2 (two) times a week.    Yes [provider]  zolpidem (AMBIEN) 10 MG tablet Take 10 mg by mouth at bedtime.   Yes [provider]  nitroGLYCERIN (NITROSTAT) 0.4 MG SL tablet Place 1 tablet (0.4 mg total) under the tongue every 5 (five) minutes as needed for chest pain. 01/27/21   Minna Merritts, MD  nystatin cream (MYCOSTATIN) Apply 1 application. topically daily as needed (yeast).    [provider]   Allergies  Allergen Reactions   Gabapentin Swelling   Keppra [Levetiracetam] Nausea And Vomiting   Tape Rash    Plastic tape: Paper tape ONLY!  Valium [Diazepam] Other (See Comments)    Significant depression; "a different person"    Social History   Tobacco Use   Smoking status: Former    Years: 45.00    Types: Cigarettes    Quit date: 1973    Years since quitting: 50.9    Passive exposure: Past   Smokeless tobacco: Former    Types: Chew    Quit date: 2013   Tobacco comments:    quit smoking cigarettes > 30 years ago  Substance Use Topics   Alcohol use: Yes    Alcohol/week: 2.0 - 6.0 standard drinks of alcohol    Types: 2 - 3 Shots of liquor per week    Family History  Problem Relation Age of Onset   Heart attack Mother    Hypertension Mother    Hyperlipidemia Mother    Diabetes Mother    Cancer Father    Heart attack Brother 55   Hyperlipidemia Brother    Hypertension Brother    Heart disease Brother    Breast cancer Neg Hx    Colon polyps Neg Hx    Colon cancer Neg Hx    Esophageal cancer Neg Hx    Liver cancer Neg Hx    Ovarian cancer Neg Hx    Pancreatic cancer Neg Hx    Prostate cancer Neg Hx    Rectal cancer Neg Hx    Stomach cancer Neg Hx      Review of Systems  Positive ROS: neg  All other systems have been reviewed and were otherwise negative with  the exception of those mentioned in the HPI and as above.  Objective: Vital signs in last 24 hours: Temp:  [98.3 F (36.8 C)] 98.3 F (36.8 C) (11/15 0821) Pulse Rate:  [65] 65 (11/15 0821) Resp:  [18] 18 (11/15 0821) BP: (151)/(80) 151/80 (11/15 0821) SpO2:  [97 %] 97 % (11/15 0821) Weight:  [117.9 kg] 117.9 kg (11/15 0821)  General Appearance: Alert, cooperative, no distress, appears stated age Head: Normocephalic, without obvious abnormality, atraumatic Eyes: PERRL, conjunctiva/corneas clear, EOM's intact    Neck: Supple, symmetrical, trachea midline Back: Symmetric, no curvature, ROM normal, no CVA tenderness Lungs:  respirations unlabored Heart: Regular rate and rhythm Abdomen: Soft, non-tender Extremities: Extremities normal, atraumatic, no cyanosis or edema Pulses: 2+ and symmetric all extremities Skin: Skin color, texture, turgor normal, no rashes or lesions  NEUROLOGIC:   Mental status: Alert and oriented x4,  no aphasia, good attention span, fund of knowledge, and memory Motor Exam - grossly normal Sensory Exam - grossly normal Reflexes: 1= Coordination - grossly normal Gait - grossly normal Balance - grossly normal Cranial Nerves: I: smell Not tested  II: visual acuity  OS: nl    OD: nl  II: visual fields Full to confrontation  II: pupils Equal, round, reactive to light  III,VII: ptosis None  III,IV,VI: extraocular muscles  Full ROM  V: mastication Normal  V: facial light touch sensation  Normal  V,VII: corneal reflex  Present  VII: facial muscle function - upper  Normal  VII: facial muscle function - lower Normal  VIII: hearing Not tested  IX: soft palate elevation  Normal  IX,X: gag reflex Present  XI: trapezius strength  5/5  XI: sternocleidomastoid strength 5/5  XI: neck flexion strength  5/5  XII: tongue strength  Normal    Data Review Lab Results  Component Value Date   WBC 7.1 04/06/2022   HGB 18.0 (H) 04/14/2022  HCT 53.0 (H) 04/14/2022    MCV 90.5 04/06/2022   PLT 255 04/06/2022   Lab Results  Component Value Date   NA 134 (L) 04/14/2022   K 4.1 04/14/2022   CL 97 (L) 04/14/2022   CO2 26 04/06/2022   BUN 18 04/14/2022   CREATININE 0.80 04/14/2022   GLUCOSE 101 (H) 04/14/2022   Lab Results  Component Value Date   INR 1.0 04/06/2022    Assessment/Plan:  Estimated body mass index is 34.3 kg/m as calculated from the following:   Height as of this encounter: '6\' 1"'$  (1.854 m).   Weight as of this encounter: 117.9 kg. Patient admitted for lumbar instrumented fusion L1-3. Patient has failed a reasonable attempt at conservative therapy.  I explained the condition and procedure to the patient and answered any questions.  Patient wishes to proceed with procedure as planned. Understands risks/ benefits and typical outcomes of procedure.   [name] 04/14/2022 10:34 AM

## 2022-04-14 NOTE — Transfer of Care (Signed)
Immediate Anesthesia Transfer of Care Note  Patient: Darin Gomez  Procedure(s) Performed: Posterior lateral fusion - Lumbar one-three, extension of instrumentation Lumbar one-three, exploration of fusion (Back)  Patient Location: PACU  Anesthesia Type:General  Level of Consciousness: drowsy and patient cooperative  Airway & Oxygen Therapy: Patient Spontanous Breathing and Patient connected to nasal cannula oxygen  Post-op Assessment: Report given to RN and Post -op Vital signs reviewed and stable  Post vital signs: Reviewed and stable  Last Vitals:  Vitals Value Taken Time  BP 167/82 04/14/22 1338  Temp    Pulse 70 04/14/22 1343  Resp 12 04/14/22 1343  SpO2 94 % 04/14/22 1343  Vitals shown include unvalidated device data.  Last Pain:  Vitals:   04/14/22 0851  TempSrc:   PainSc: 4       Patients Stated Pain Goal: 3 (38/46/65 9935)  Complications: No notable events documented.

## 2022-04-15 NOTE — Evaluation (Signed)
Occupational Therapy Evaluation Patient Details Name: Darin Gomez MRN: [id number] DOB: 03-04-1951 Today's Date: 04/15/2022   History of Present Illness 71 y/o male admitted on 04/14/22 following PLIF L1-3 and hardware removal of L2-3. PMH: HTN, vertigo, hx of multiple back surgeries   Clinical Impression   Patient admitted for the diagnosis above.  PTA he lives with his spouse, who can assist as needed.  Currently he is needing up to [facility] for basic mobility, per PT, has a thorough understanding of all his back precautions, and has no difficulty with brace management.  His spouse will be able to assist with lower body ADL over the short term, but he should have a good outcome, and regain his independence.  Appropriate post op pain is the only deficit.  ADL completion with respect to back precautions reviewed, and all questions answered.  No further OT exists in the acute setting, and no post OT is needed.  Recommend follow up as prescribed by the MD.         Recommendations for follow up therapy are one component of a multi-disciplinary discharge planning process, led by the attending physician.  Recommendations may be updated based on patient status, additional functional criteria and insurance authorization.   Follow Up Recommendations  No OT follow up     Assistance Recommended at Discharge Intermittent Supervision/Assistance  Patient can return home with the following Assist for transportation;Assistance with cooking/housework;A little help with bathing/dressing/bathroom    Functional Status Assessment  Patient has not had a recent decline in their functional status  Equipment Recommendations  None recommended by OT    Recommendations for Other Services       Precautions / Restrictions Precautions Precautions: Back Precaution Booklet Issued: Yes (comment) Required Braces or Orthoses: Spinal Brace Spinal Brace: Lumbar corset;Applied in sitting  position Restrictions Weight Bearing Restrictions: No      Mobility Bed Mobility               General bed mobility comments: up in the recliner    Transfers Overall transfer level: Needs assistance Equipment used: Rolling walker (2 wheels) Transfers: Sit to/from Stand Sit to Stand: Min guard                  Balance Overall balance assessment: Mild deficits observed, not formally tested                                         ADL either performed or assessed with clinical judgement   ADL Overall ADL's : At baseline                                             Vision   Vision Assessment?: No apparent visual deficits     Perception     Praxis      Pertinent Vitals/Pain Pain Assessment Pain Assessment: Faces Faces Pain Scale: Hurts little more Pain Location: back Pain Descriptors / Indicators: Grimacing, Tender Pain Intervention(s): Monitored during session     Hand Dominance     Extremity/Trunk Assessment Upper Extremity Assessment Upper Extremity Assessment: Overall WFL for tasks assessed   Lower Extremity Assessment Lower Extremity Assessment: Defer to PT evaluation   Cervical / Trunk Assessment Cervical / Trunk Assessment: Back Surgery  Communication Communication Communication: No difficulties   Cognition Arousal/Alertness: Awake/alert Behavior During Therapy: WFL for tasks assessed/performed Overall Cognitive Status: Within Functional Limits for tasks assessed                                       General Comments   VSS     Exercises     Shoulder Instructions      Home Living Family/patient expects to be discharged to:: Private residence Living Arrangements: Spouse/significant other Available Help at Discharge: Family;Available 24 hours/day Type of Home: House Home Access: Ramped entrance     Home Layout: One level     Bathroom Shower/Tub: Medical illustrator: Handicapped height Bathroom Accessibility: Yes   Home Equipment: Conservation officer, nature (2 wheels);Cane - single point;Grab bars - tub/shower;Grab bars - toilet          Prior Functioning/Environment Prior Level of Function : Independent/Modified Independent;Driving             Mobility Comments: SPC for mobility, RW intermittently ADLs Comments: Occasional assist with lower body ADL depending on b        OT Problem List: Pain;Decreased strength      OT Treatment/Interventions:      OT Goals(Current goals can be found in the care plan section) Acute Rehab OT Goals Patient Stated Goal: Return home OT Goal Formulation: With patient Potential to Achieve Goals: Good  OT Frequency:      Co-evaluation              AM-PAC OT "6 Clicks" Daily Activity     Outcome Measure Help from another person eating meals?: None   Help from another person toileting, which includes using toliet, bedpan, or urinal?: None Help from another person bathing (including washing, rinsing, drying)?: A Little Help from another person to put on and taking off regular upper body clothing?: None Help from another person to put on and taking off regular lower body clothing?: A Little 6 Click Score: 18   End of Session Nurse Communication: Mobility status  Activity Tolerance: Patient tolerated treatment well Patient left: in chair;with call bell/phone within reach;with family/visitor present  OT Visit Diagnosis: Pain;Muscle weakness (generalized) (M62.81) Pain - part of body:  (L spine)                Time: 9371-6967 OT Time Calculation (min): 22 min Charges:  OT General Charges $OT Visit: 1 Visit OT Evaluation $OT Eval Moderate Complexity: 1 Mod  04/15/2022  RP, OTR/L  Acute Rehabilitation Services  Office:  [phone]   Metta Clines 04/15/2022, 10:45 AM

## 2022-04-15 NOTE — Progress Notes (Signed)
[name], male   DOB: 27-Jun-1950, 71 y.o.   MRN: [id number] Subjective: Patient reports back soreness requiring IV pain meds  Objective: Vital signs in last 24 hours: Temp:  [98 F (36.7 C)-98.9 F (37.2 C)] 98 F (36.7 C) (11/16 0749) Pulse Rate:  [55-71] 57 (11/16 0749) Resp:  [12-20] 18 (11/16 0749) BP: (97-167)/(48-87) 97/55 (11/16 0749) SpO2:  [91 %-98 %] 96 % (11/16 0749) Weight:  [117.9 kg] 117.9 kg (11/15 0821)  Intake/Output from previous day: 11/15 0701 - 11/16 0700 In: 1854.6 [P.O.:840; I.V.:714.6; IV Piggyback:300] Out: 575 [Drains:425; Blood:150] Intake/Output this shift: No intake/output data recorded.  Neurologic: Grossly normal  Lab Results: Lab Results  Component Value Date   WBC 7.1 04/06/2022   HGB 18.0 (H) 04/14/2022   HCT 53.0 (H) 04/14/2022   MCV 90.5 04/06/2022   PLT 255 04/06/2022   Lab Results  Component Value Date   INR 1.0 04/06/2022   BMET Lab Results  Component Value Date   NA 134 (L) 04/14/2022   K 4.1 04/14/2022   CL 97 (L) 04/14/2022   CO2 26 04/06/2022   GLUCOSE 101 (H) 04/14/2022   BUN 18 04/14/2022   CREATININE 0.80 04/14/2022   CALCIUM 9.0 04/06/2022    Studies/Results: DG Lumbar Spine 2-3 Views  Result Date: 04/14/2022 CLINICAL DATA:  Posterior-lateral fusion L1 through L3 with extension of instrumentation L1 through L3. EXAM: LUMBAR SPINE - 2-3 VIEW COMPARISON:  Lumbar spine radiographs 03/04/2022 FINDINGS: AP and lateral C-arm images of the lumbar spine obtained in the operating room. Multilevel or pedicle screw and rod fusion. C-arm provided for surgical localization. Interbody spacers also present at multiple levels. Accurate surgical levels cannot be determined due to limited field of view . IMPRESSION: C-arm provided for lumbar fusion. Follow-up radiographs suggested to confirm surgical levels. Electronically Signed   By: Franchot Gallo M.D.   On: 04/14/2022 13:43   DG C-Arm 1-60 Min-No  Report  Result Date: 04/14/2022 Fluoroscopy was utilized by the requesting physician.  No radiographic interpretation.   DG C-Arm 1-60 Min-No Report  Result Date: 04/14/2022 Fluoroscopy was utilized by the requesting physician.  No radiographic interpretation.    Assessment/Plan: Continue pain control Mobilize Hemovac with 400 out, continue drain Home when pain controlled and drainage slows down  Estimated body mass index is 34.3 kg/m as calculated from the following:   Height as of this encounter: '6\' 1"'$  (1.854 m).   Weight as of this encounter: 117.9 kg.    LOS: 1 day    Darin Gomez 04/15/2022, 7:58 AM

## 2022-04-15 NOTE — Evaluation (Signed)
Physical Therapy Evaluation Patient Details Name: Darin Gomez MRN: [id number] DOB: 08/29/1950 Today's Date: 04/15/2022  History of Present Illness  71 y/o male admitted on 04/14/22 following PLIF L1-3 and hardware removal of L2-3. PMH: HTN, vertigo, hx of multiple back surgeries  Clinical Impression  Patient admitted following the above. Patient lives with wife who will be assisting at discharge. Patient presents with weakness, impaired sensation, and impaired balance. Able recall back precautions from previous surgeries. Ambulating with RW and supervision and complaining of sharp pain in R thigh with prolonged time. Able to negotiate stairs with min guard and use of rail. Patient will benefit from skilled PT services during acute stay to address listed deficits. Recommend HHPT at discharge.      Recommendations for follow up therapy are one component of a multi-disciplinary discharge planning process, led by the attending physician.  Recommendations may be updated based on patient status, additional functional criteria and insurance authorization.  Follow Up Recommendations Home health PT      Assistance Recommended at Discharge Frequent or constant Supervision/Assistance  Patient can return home with the following  A little help with walking and/or transfers;A little help with bathing/dressing/bathroom;Help with stairs or ramp for entrance    Equipment Recommendations None recommended by PT  Recommendations for Other Services       Functional Status Assessment Patient has had a recent decline in their functional status and demonstrates the ability to make significant improvements in function in a reasonable and predictable amount of time.     Precautions / Restrictions Precautions Precautions: Back Precaution Booklet Issued: Yes (comment) Required Braces or Orthoses: Spinal Brace Spinal Brace: Lumbar corset;Applied in sitting position Restrictions Weight Bearing  Restrictions: No      Mobility  Bed Mobility Overal bed mobility: Modified Independent             General bed mobility comments: increased time and effort with + use of rails    Transfers Overall transfer level: Needs assistance Equipment used: Rolling Lewellyn Fultz (2 wheels) Transfers: Sit to/from Stand Sit to Stand: Min guard                Ambulation/Gait Ambulation/Gait assistance: Supervision Gait Distance (Feet): 300 Feet Assistive device: Rolling Marlinda Miranda (2 wheels) Gait Pattern/deviations: Step-through pattern, Decreased stride length, Narrow base of support Gait velocity: decreased     General Gait Details: supervision for safety. Reporting sharp pain in R thigh with prolonged mobility. very guarded gait pattern  Stairs Stairs: Yes Stairs assistance: Min guard Stair Management: Two rails, Step to pattern, Forwards Number of Stairs: 3 General stair comments: min guard for safety. Up forwards and down sideways  Wheelchair Mobility    Modified Rankin (Stroke Patients Only)       Balance Overall balance assessment: Mild deficits observed, not formally tested                                           Pertinent Vitals/Pain Pain Assessment Pain Assessment: Faces Faces Pain Scale: Hurts little more Pain Location: back Pain Descriptors / Indicators: Discomfort, Grimacing Pain Intervention(s): Limited activity within patient's tolerance, Monitored during session, Repositioned    Home Living Family/patient expects to be discharged to:: Private residence Living Arrangements: Spouse/significant other Available Help at Discharge: Family;Available 24 hours/day Type of Home: House Home Access: Ramped entrance (1-2 steps at end of ramp)  Home Layout: One level Home Equipment: Conservation officer, nature (2 wheels);Cane - single point;Grab bars - tub/shower;Grab bars - toilet      Prior Function Prior Level of Function : Independent/Modified  Independent;Driving             Mobility Comments: SPC for mobility, RW intermittently ADLs Comments: indep     Hand Dominance        Extremity/Trunk Assessment   Upper Extremity Assessment Upper Extremity Assessment: Defer to OT evaluation    Lower Extremity Assessment Lower Extremity Assessment: Generalized weakness    Cervical / Trunk Assessment Cervical / Trunk Assessment: Back Surgery  Communication   Communication: No difficulties  Cognition Arousal/Alertness: Awake/alert Behavior During Therapy: WFL for tasks assessed/performed Overall Cognitive Status: Within Functional Limits for tasks assessed                                          General Comments      Exercises     Assessment/Plan    PT Assessment Patient needs continued PT services  PT Problem List Decreased strength;Decreased activity tolerance;Decreased balance;Decreased mobility;Impaired sensation       PT Treatment Interventions Gait training;DME instruction;Stair training;Functional mobility training;Therapeutic activities;Therapeutic exercise;Balance training;Patient/family education    PT Goals (Current goals can be found in the Care Plan section)  Acute Rehab PT Goals Patient Stated Goal: to go home PT Goal Formulation: With patient Time For Goal Achievement: 04/29/22 Potential to Achieve Goals: Good    Frequency Min 5X/week     Co-evaluation               AM-PAC PT "6 Clicks" Mobility  Outcome Measure Help needed turning from your back to your side while in a flat bed without using bedrails?: None Help needed moving from lying on your back to sitting on the side of a flat bed without using bedrails?: None Help needed moving to and from a bed to a chair (including a wheelchair)?: A Little Help needed standing up from a chair using your arms (e.g., wheelchair or bedside chair)?: A Little Help needed to walk in hospital room?: A Little Help needed  climbing 3-5 steps with a railing? : A Little 6 Click Score: 20    End of Session Equipment Utilized During Treatment: Back brace Activity Tolerance: Patient tolerated treatment well Patient left: in chair;with call bell/phone within reach;with family/visitor present Nurse Communication: Mobility status PT Visit Diagnosis: Muscle weakness (generalized) (M62.81)    Time: 2725-3664 PT Time Calculation (min) (ACUTE ONLY): 34 min   Charges:   PT Evaluation $PT Eval Low Complexity: 1 Low PT Treatments $Therapeutic Activity: 8-22 mins        Delando Satter A. Gilford Rile PT, DPT Acute Rehabilitation Services Office [phone]   Linna Hoff 04/15/2022, 9:46 AM

## 2022-04-16 ENCOUNTER — Encounter (HOSPITAL_COMMUNITY): Payer: Self-pay | Admitting: Neurological Surgery

## 2022-04-16 MED ORDER — LISINOPRIL 10 MG PO TABS
10.0000 mg | ORAL_TABLET | Freq: Every day | ORAL | Status: DC
  Administered 2022-04-16: 10 mg via ORAL
  Filled 2022-04-16: qty 1

## 2022-04-16 MED ORDER — HYDROCHLOROTHIAZIDE 12.5 MG PO TABS
12.5000 mg | ORAL_TABLET | Freq: Every day | ORAL | Status: DC
  Administered 2022-04-16: 12.5 mg via ORAL
  Filled 2022-04-16: qty 1

## 2022-04-16 MED ORDER — MORPHINE SULFATE ER 15 MG PO TBCR
30.0000 mg | EXTENDED_RELEASE_TABLET | Freq: Two times a day (BID) | ORAL | Status: DC
  Administered 2022-04-16: 30 mg via ORAL
  Filled 2022-04-16: qty 2

## 2022-04-16 NOTE — Plan of Care (Signed)

## 2022-04-16 NOTE — Progress Notes (Signed)
Physical Therapy Treatment Patient Details Name: Darin Gomez MRN: [id number] DOB: [birth year] Today's Date: 04/16/2022   History of Present Illness 71 y/o male admitted on 04/14/22 following PLIF L1-3 and hardware removal of L2-3. PMH: HTN, vertigo, hx of multiple back surgeries    PT Comments    Patient progressing towards physical therapy goals. Patient functioning at supervision level with RW for ambulation and able to negotiate stairs to access home environment. Patient with improved gait quality and speed this session despite incisional site pain. D/c plan remains appropriate.     Recommendations for follow up therapy are one component of a multi-disciplinary discharge planning process, led by the attending physician.  Recommendations may be updated based on patient status, additional functional criteria and insurance authorization.  Follow Up Recommendations  Home health PT     Assistance Recommended at Discharge Frequent or constant Supervision/Assistance  Patient can return home with the following A little help with walking and/or transfers;A little help with bathing/dressing/bathroom;Help with stairs or ramp for entrance   Equipment Recommendations  None recommended by PT    Recommendations for Other Services       Precautions / Restrictions Precautions Precautions: Back Precaution Booklet Issued: Yes (comment) Required Braces or Orthoses: Spinal Brace Spinal Brace: Lumbar corset;Applied in sitting position Restrictions Weight Bearing Restrictions: No     Mobility  Bed Mobility               General bed mobility comments: up in the recliner    Transfers Overall transfer level: Modified independent Equipment used: Rolling Jedadiah Abdallah (2 wheels)                    Ambulation/Gait Ambulation/Gait assistance: Supervision Gait Distance (Feet): 300 Feet Assistive device: Rolling Darneshia Demary (2 wheels) Gait Pattern/deviations: Step-through pattern,  Decreased stride length, Narrow base of support Gait velocity: decreased     General Gait Details: supervision for safety   Stairs Stairs: Yes Stairs assistance: Supervision Stair Management: Two rails, Step to pattern, Forwards Number of Stairs: 3 General stair comments: supervision for safety for safety. up forwards and down sideways   Wheelchair Mobility    Modified Rankin (Stroke Patients Only)       Balance Overall balance assessment: Mild deficits observed, not formally tested                                          Cognition Arousal/Alertness: Awake/alert Behavior During Therapy: WFL for tasks assessed/performed Overall Cognitive Status: Within Functional Limits for tasks assessed                                          Exercises      General Comments        Pertinent Vitals/Pain Pain Assessment Pain Assessment: Faces Faces Pain Scale: Hurts little more Pain Location: back Pain Descriptors / Indicators: Grimacing, Tender Pain Intervention(s): Monitored during session    Home Living                          Prior Function            PT Goals (current goals can now be found in the care plan section) Acute Rehab PT Goals Patient Stated Goal: to go  home PT Goal Formulation: With patient Time For Goal Achievement: 04/29/22 Potential to Achieve Goals: Good Progress towards PT goals: Progressing toward goals    Frequency    Min 5X/week      PT Plan Current plan remains appropriate    Co-evaluation              AM-PAC PT "6 Clicks" Mobility   Outcome Measure  Help needed turning from your back to your side while in a flat bed without using bedrails?: None Help needed moving from lying on your back to sitting on the side of a flat bed without using bedrails?: None Help needed moving to and from a bed to a chair (including a wheelchair)?: A Little Help needed standing up from a chair  using your arms (e.g., wheelchair or bedside chair)?: A Little Help needed to walk in hospital room?: A Little Help needed climbing 3-5 steps with a railing? : A Little 6 Click Score: 20    End of Session Equipment Utilized During Treatment: Back brace Activity Tolerance: Patient tolerated treatment well Patient left: in chair;with call bell/phone within reach;with family/visitor present Nurse Communication: Mobility status PT Visit Diagnosis: Muscle weakness (generalized) (M62.81)     Time: 2549-8264 PT Time Calculation (min) (ACUTE ONLY): 35 min  Charges:  $Therapeutic Activity: 23-37 mins                     Edric Fetterman A. Gilford Rile PT, DPT Acute Rehabilitation Services Office [phone]    Linna Hoff 04/16/2022, 9:22 AM

## 2022-04-16 NOTE — TOC Transition Note (Signed)
Transition of Care [facility]) - CM/SW Discharge Note   Patient Details  Name: Darin Gomez MRN: [id number] Date of Birth: 1950-08-14  [facility] CM/SW Contact:  Pollie Friar, RN Phone Number: 04/16/2022, 10:21 AM   Clinical Narrative:    Pt discharging home with home health therapy through [facility]. Information on the AVS.  Pt has transport home.   Final next level of care: Home w Home Health Services Barriers to Discharge: No Barriers Identified   Patient Goals and CMS Choice   CMS Medicare.gov Compare Post Acute Care list provided to:: Patient Choice offered to / list presented to : Patient  Discharge Placement                       Discharge Plan and Services                          HH Arranged: PT [facility] Agency: [place] Date [place]: 04/16/22   Representative spoke with at [place]: [place] ([place]) Interventions     Readmission Risk Interventions     No data to display

## 2022-04-16 NOTE — Discharge Summary (Signed)
Physician Discharge Summary  [name] MRN: [id number] DOB/AGE: 1950/08/07 71 y.o.  Admit date: 04/14/2022 Discharge date: 04/16/2022  Admission Diagnoses: Pseudoarthrosis with loosening of instrumentation L2-3 with back pain and leg pain     Discharge Diagnoses: same   Discharged Condition: good  Hospital Course: The patient was admitted on 04/14/2022 and taken to the operating room where the patient underwent extension of fusion L1-3 with posterior lateral fusion . The patient tolerated the procedure well and was taken to the recovery room and then to the floor in stable condition. The hospital course was routine. There were no complications. The wound remained clean dry and intact. Pt had appropriate back soreness. No complaints of leg pain or new N/T/W. The patient remained afebrile with stable vital signs, and tolerated a regular diet. The patient continued to increase activities, and pain was well controlled with oral pain medications.   Consults: None  Significant Diagnostic Studies:  Results for orders placed or performed during the hospital encounter of 04/14/22  I-STAT, chem 8  Result Value Ref Range   Sodium 134 (L) 135 - 145 mmol/L   Potassium 4.1 3.5 - 5.1 mmol/L   Chloride 97 (L) 98 - 111 mmol/L   BUN 18 8 - 23 mg/dL   Creatinine, Ser 0.80 0.61 - 1.24 mg/dL   Glucose, Bld 101 (H) 70 - 99 mg/dL   Calcium, Ion 1.10 (L) 1.15 - 1.40 mmol/L   TCO2 27 22 - 32 mmol/L   Hemoglobin 18.0 (H) 13.0 - 17.0 g/dL   HCT 53.0 (H) 39.0 - 52.0 %    DG Lumbar Spine 2-3 Views  Result Date: 04/14/2022 CLINICAL DATA:  Posterior-lateral fusion L1 through L3 with extension of instrumentation L1 through L3. EXAM: LUMBAR SPINE - 2-3 VIEW COMPARISON:  Lumbar spine radiographs 03/04/2022 FINDINGS: AP and lateral C-arm images of the lumbar spine obtained in the operating room. Multilevel or pedicle screw and rod fusion. C-arm provided for surgical localization. Interbody  spacers also present at multiple levels. Accurate surgical levels cannot be determined due to limited field of view . IMPRESSION: C-arm provided for lumbar fusion. Follow-up radiographs suggested to confirm surgical levels. Electronically Signed   By: Franchot Gallo M.D.   On: 04/14/2022 13:43   DG C-Arm 1-60 Min-No Report  Result Date: 04/14/2022 Fluoroscopy was utilized by the requesting physician.  No radiographic interpretation.   DG C-Arm 1-60 Min-No Report  Result Date: 04/14/2022 Fluoroscopy was utilized by the requesting physician.  No radiographic interpretation.    Antibiotics:  Anti-infectives (From admission, onward)    Start     Dose/Rate Route Frequency Ordered Stop   04/14/22 2000  ceFAZolin (ANCEF) IVPB 2g/100 mL premix        2 g 200 mL/hr over 30 Minutes Intravenous Every 8 hours 04/14/22 1500 04/15/22 0437   04/14/22 1146  vancomycin (VANCOCIN) powder  Status:  Discontinued          As needed 04/14/22 1146 04/14/22 1335   04/14/22 0815  ceFAZolin (ANCEF) IVPB 3g/100 mL premix        3 g 200 mL/hr over 30 Minutes Intravenous On call to O.R. 04/14/22 0806 04/14/22 1104       Discharge Exam: Blood pressure (!) 140/64, pulse 60, temperature 98.7 F (37.1 C), temperature source Oral, resp. rate 20, height '6\' 1"'$  (1.854 m), weight 117.9 kg, SpO2 95 %. Neurologic: Grossly normal Ambulating and voiding well incision cdi   Discharge Medications:   Allergies  as of 04/16/2022       Reactions   Gabapentin Swelling   Keppra [levetiracetam] Nausea And Vomiting   Tape Rash   Plastic tape: Paper tape ONLY!   Valium [diazepam] Other (See Comments)   Significant depression; "a different person"        Medication List     TAKE these medications    acetaminophen 500 MG tablet Commonly known as: TYLENOL Take 2 tablets (1,000 mg total) by mouth every 8 (eight) hours. What changed:  when to take this reasons to take this   aspirin 81 MG chewable tablet Chew 81  mg by mouth at bedtime.   cetirizine 10 MG tablet Commonly known as: ZYRTEC Take 10 mg by mouth daily.   famotidine 20 MG tablet Commonly known as: PEPCID Take 20 mg by mouth daily.   hydrOXYzine 25 MG tablet Commonly known as: ATARAX Take 25 mg by mouth at bedtime.   lisinopril-hydrochlorothiazide 10-12.5 MG tablet Commonly known as: ZESTORETIC Take 1 tablet by mouth daily.   methocarbamol 750 MG tablet Commonly known as: ROBAXIN Take 750 mg by mouth 3 (three) times daily.   morphine 30 MG 12 hr tablet Commonly known as: MS CONTIN Take 30 mg by mouth every 12 (twelve) hours.   nitroGLYCERIN 0.4 MG SL tablet Commonly known as: NITROSTAT Place 1 tablet (0.4 mg total) under the tongue every 5 (five) minutes as needed for chest pain.   nystatin cream Commonly known as: MYCOSTATIN Apply 1 application. topically daily as needed (yeast).   oxyCODONE 15 MG immediate release tablet Commonly known as: ROXICODONE Take 15 mg by mouth every 4 (four) hours as needed for pain.   polyethylene glycol 17 g packet Commonly known as: MIRALAX / GLYCOLAX Take 17 g by mouth 2 (two) times daily.   pravastatin 80 MG tablet Commonly known as: PRAVACHOL Take 1 tablet (80 mg total) by mouth daily.   pregabalin 75 MG capsule Commonly known as: LYRICA TAKE ONE CAPSULE BY MOUTH THREE TIMES DAILY What changed:  how much to take when to take this additional instructions   sildenafil 100 MG tablet Commonly known as: VIAGRA Take 100 mg by mouth daily as needed for erectile dysfunction.   testosterone cypionate 200 MG/ML injection Commonly known as: DEPOTESTOSTERONE CYPIONATE Inject 200 mg into the muscle every 14 (fourteen) days.   Vitamin D (Ergocalciferol) 1.25 MG (50000 UNIT) Caps capsule Commonly known as: DRISDOL Take 50,000 Units by mouth 2 (two) times a week.   zolpidem 10 MG tablet Commonly known as: AMBIEN Take 10 mg by mouth at bedtime.               Durable  Medical Equipment  (From admission, onward)           Start     Ordered   04/14/22 1501  DME Walker rolling  Once       Question:  Patient needs a walker to treat with the following condition  Answer:  S/P lumbar fusion   04/14/22 1500   04/14/22 1501  DME 3 n 1  Once        04/14/22 1500            Disposition: home   Final Dx: extension of fusion L1-3 with posterior lateral fusion   Discharge Instructions      Remove dressing in 72 hours   Complete by: As directed    Call MD for:  difficulty breathing, headache or visual disturbances  Complete by: As directed    Call MD for:  hives   Complete by: As directed    Call MD for:  persistant nausea and vomiting   Complete by: As directed    Call MD for:  redness, tenderness, or signs of infection (pain, swelling, redness, odor or green/yellow discharge around incision site)   Complete by: As directed    Call MD for:  severe uncontrolled pain   Complete by: As directed    Call MD for:  temperature >100.4   Complete by: As directed    Diet - low sodium heart healthy   Complete by: As directed    Driving Restrictions   Complete by: As directed    No driving for 2 weeks, no riding in the car for 1 week   Increase activity slowly   Complete by: As directed    Lifting restrictions   Complete by: As directed    No lifting more than 8 lbs        Follow-up Information     [facility] [place] Follow up.   Specialty: [place] Why: The home health agency will contact you for the first home visit Contact information: [address] [place] [address] [phone]                  Signed: Ocie Cornfield [facility] 04/16/2022, 7:51 AM

## 2022-04-16 NOTE — Progress Notes (Signed)
Patient alert and oriented, void, surgical site dry and clean no sign of infection,VSS. D/c instructions explain and copy given all questions answered. Pt. D/c home per order.

## 2022-08-03 ENCOUNTER — Other Ambulatory Visit: Payer: Self-pay | Admitting: Neurological Surgery

## 2022-08-03 DIAGNOSIS — S32009S Unspecified fracture of unspecified lumbar vertebra, sequela: Secondary | ICD-10-CM

## 2022-10-04 ENCOUNTER — Ambulatory Visit
Admission: RE | Admit: 2022-10-04 | Discharge: 2022-10-04 | Disposition: A | Discharge status: Home / Self Care | Payer: Medicare Other | Source: Ambulatory Visit | Attending: Neurological Surgery | Admitting: Neurological Surgery

## 2022-10-04 DIAGNOSIS — S32009S Unspecified fracture of unspecified lumbar vertebra, sequela: Secondary | ICD-10-CM

## 2023-01-18 ENCOUNTER — Other Ambulatory Visit: Payer: Self-pay | Admitting: Neurological Surgery

## 2023-01-18 DIAGNOSIS — S32009K Unspecified fracture of unspecified lumbar vertebra, subsequent encounter for fracture with nonunion: Secondary | ICD-10-CM

## 2023-02-04 ENCOUNTER — Other Ambulatory Visit: Payer: Medicare Other

## 2023-02-18 ENCOUNTER — Ambulatory Visit
Admission: RE | Admit: 2023-02-18 | Discharge: 2023-02-18 | Disposition: A | Discharge status: Home / Self Care | Payer: Medicare Other | Source: Ambulatory Visit | Attending: Neurological Surgery | Admitting: Neurological Surgery

## 2023-02-18 DIAGNOSIS — S32009K Unspecified fracture of unspecified lumbar vertebra, subsequent encounter for fracture with nonunion: Secondary | ICD-10-CM

## 2023-03-31 ENCOUNTER — Telehealth: Payer: Self-pay | Admitting: Cardiovascular Disease

## 2023-03-31 ENCOUNTER — Other Ambulatory Visit: Payer: Self-pay | Admitting: Neurological Surgery

## 2023-03-31 NOTE — Telephone Encounter (Signed)
Pre-operative Risk Assessment    Patient Name: Darin Gomez  DOB: Aug 22, 1950 MRN: [id number]      Request for Surgical Clearance    Procedure:   L1-2 posterolateral fusion with removal of hardware  Date of Surgery:  Clearance 05/06/23                                 Surgeon:  Tia Alert Surgeon's Group or Practice Name:  [facility] Phone number:  [phone] Fax number:  [phone]   Type of Clearance Requested:   - Medical    Type of Anesthesia:  General    Additional requests/questions:    SignedNorman Herrlich   03/31/2023, 12:54 PM

## 2023-04-01 ENCOUNTER — Telehealth: Payer: Self-pay

## 2023-04-01 NOTE — Telephone Encounter (Signed)
Called and spoke to patient scheduled office visit for pre=op clearance on 04/11/23 at 10:55 am patient voiced understanding

## 2023-04-01 NOTE — Telephone Encounter (Signed)
Called and spoke to patient scheduled office visit for pre-op clearance on 11/11 at 10:55 patient voiced understanding.

## 2023-04-01 NOTE — Telephone Encounter (Signed)
   Name: TYSIN SALADA  DOB: [birth year]  MRN: [id number]  Primary Cardiologist: Julien Nordmann, MD  Chart reviewed as part of pre-operative protocol coverage. Because of Suleiman Finigan [place]'s past medical history and time since last visit, he will require a follow-up in-office visit in order to better assess preoperative cardiovascular risk.  Pre-op covering staff: - Please schedule appointment and call patient to inform them. If patient already had an upcoming appointment within acceptable timeframe, please add "pre-op clearance" to the appointment notes so provider is aware. - Please contact requesting surgeon's office via preferred method (i.e, phone, fax) to inform them of need for appointment prior to surgery.   Carlos Levering, NP  04/01/2023, 1:13 PM

## 2023-04-04 ENCOUNTER — Encounter: Payer: Self-pay | Admitting: *Deleted

## 2023-04-11 ENCOUNTER — Ambulatory Visit: Payer: Medicare Other | Attending: Medical | Admitting: Medical

## 2023-04-11 ENCOUNTER — Encounter: Payer: Self-pay | Admitting: Medical

## 2023-04-11 VITALS — BP 142/80 | HR 70 | Ht 73.0 in | Wt 254.4 lb

## 2023-04-11 DIAGNOSIS — E782 Mixed hyperlipidemia: Secondary | ICD-10-CM | POA: Diagnosis not present

## 2023-04-11 DIAGNOSIS — Z01810 Encounter for preprocedural cardiovascular examination: Secondary | ICD-10-CM | POA: Diagnosis not present

## 2023-04-11 DIAGNOSIS — I10 Essential (primary) hypertension: Secondary | ICD-10-CM | POA: Insufficient documentation

## 2023-04-11 DIAGNOSIS — I700 Atherosclerosis of aorta: Secondary | ICD-10-CM | POA: Insufficient documentation

## 2023-04-11 NOTE — Patient Instructions (Signed)
Medication Instructions:  Your Physician recommend you continue on your current medication as directed.    *If you need a refill on your cardiac medications before your next appointment, please call your pharmacy*   Lab Work: None ordered   Follow-Up: At [facility], you and your health needs are our priority.  As part of our continuing mission to provide you with exceptional heart care, we have created designated Provider Care Teams.  These Care Teams include your primary Cardiologist (physician) and Advanced Practice Providers (APPs -  Physician Assistants and Nurse Practitioners) who all work together to provide you with the care you need, when you need it.  We recommend signing up for the patient portal called "MyChart".  Sign up information is provided on this After Visit Summary.  MyChart is used to connect with patients for Virtual Visits (Telemedicine).  Patients are able to view lab/test results, encounter notes, upcoming appointments, etc.  Non-urgent messages can be sent to your provider as well.   To learn more about what you can do with MyChart, go to [email or web address].    Your next appointment:   12 month(s)  Provider:   You may see Julien Nordmann, MD or one of the following Advanced Practice Providers on your designated Care Team:   Cadence [place], [place]

## 2023-04-11 NOTE — Progress Notes (Signed)
Cardiology Office Note:    Date:  04/11/2023   ID:  Darin Gomez, DOB December 28, 1950, MRN [id number]  PCP:  Lonie Peak, PA-C  [facility] Cardiologist:  Julien Nordmann, MD  [facility] Electrophysiologist:  None   Referring MD: Lonie Peak, PA-C   Chief Complaint: 1 year follow-up/pre-operative clearance  History of Present Illness:    Darin Gomez is a 72 y.o. male with a hx of aortic atherosclerosis, hypertension, hyperlipidemia, chronic pain who presents for 1 year follow-up.  Patient has history of remote heart catheter on the hospital in October 2015 that showed no significant CAD and normal LV function.  Patient was last seen October 2023 for preoperative evaluation.  He was overall stable from a cardiac perspective.  Today, patient presents for preoperative cardiac clearance. The patient is overall doing well. He denies chest pain, SOB, lower leg edema, lightheadedness, syncope. He has ear related dizziness and takes meclizine. He denies heart racing and palpitations. He is scheduled for December 6th for extraction of instrumentation. BP is mildly elevated, likely from back pain. He can walk 1-2 blocks. He can walk up a flight a flight of stairs and can do moderate housework. He had blood work at PCP.   Past Medical History:  Diagnosis Date   Allergy    Arthritis    mainly in back   Back pain    Balance disorder    uses cane daily   Blood in urine    Chest pain    Constipation    Diverticulosis 02/14/2018   noted on colonoscopy   DOE (dyspnea on exertion)    Environmental allergies    Foot swelling    Bilateral   GERD (gastroesophageal reflux disease)    Headache(784.0)    occas.  migraines  (from last surgery)   Heart murmur    as child....Marland Kitchenno problems as an adult   History of colon polyps 02/14/2018   Hyperlipidemia    Hypertension    dx  10 yrs or more  ago   Insomnia    Neck pain    Neuromuscular disorder (HCC)    bilateral feet   Night  sweats    Nonobstructive CAD (coronary artery disease)    a. 01/2014 MV: mild lateral ischemia. EF 56%; b. 02/2014 Cath: LM nl, LAD nl, LCX min irregs/<10%m, RCA mild ectasia mid vessel, otw nl, EF 55-65%.   Painful swelling of joint    PONV (postoperative nausea and vomiting)    Poor circulation of extremity    legs   Sinus disorder    Spinal headache    Vertigo     Past Surgical History:  Procedure Laterality Date   ABDOMINAL EXPOSURE N/A 09/14/2019   Procedure: ABDOMINAL EXPOSURE;  Surgeon: Cephus Shelling, MD;  Location: [facility] OR;  Service: Vascular;  Laterality: N/A;   ANTERIOR LAT LUMBAR FUSION Right 09/14/2019   Procedure: Right Lumbar three-four Lumbar four-five  Anterolateral lumbar interbody fusion;  Surgeon: Maeola Harman, MD;  Location: [facility] OR;  Service: Neurosurgery;  Laterality: Right;   ANTERIOR LUMBAR FUSION N/A 09/14/2019   Procedure: Lumbar five Sacral one Anterior lumbar interbody fusion;  Surgeon: Maeola Harman, MD;  Location: [facility] OR;  Service: Neurosurgery;  Laterality: N/A;   BACK SURGERY     CARDIAC CATHETERIZATION     03/04/14   COLONOSCOPY     FRACTURE SURGERY     left arm   KNEE SURGERY Right 01/02/2018   torn minicus   LAMINECTOMY WITH  POSTERIOR LATERAL ARTHRODESIS LEVEL 2 N/A 04/14/2022   Procedure: Posterior lateral fusion - Lumbar one-three, extension of instrumentation Lumbar one-three, exploration of fusion;  Surgeon: Tia Alert, MD;  Location: [facility] OR;  Service: Neurosurgery;  Laterality: N/A;   LEFT HEART CATHETERIZATION WITH CORONARY ANGIOGRAM N/A 03/04/2014   Procedure: LEFT HEART CATHETERIZATION WITH CORONARY ANGIOGRAM;  Surgeon: Peter M Swaziland, MD;  Location: [facility] CATH LAB;  Service: Cardiovascular;  Laterality: N/A;   LUMBAR LAMINECTOMY/DECOMPRESSION MICRODISCECTOMY  03/15/2012   Procedure: LUMBAR LAMINECTOMY/DECOMPRESSION MICRODISCECTOMY 2 LEVELS;  Surgeon: Karn Cassis, MD;  Location: [facility] NEURO ORS;  Service: Neurosurgery;  Laterality: Bilateral;   Bilateral Lumbar three, lumbar four, lumbar five Laminectomy   LUMBAR PERCUTANEOUS PEDICLE SCREW 3 LEVEL N/A 09/14/2019   Procedure: Lumbar three to Sacral one posterior pedicle screw fixation;  Surgeon: Maeola Harman, MD;  Location: [facility] OR;  Service: Neurosurgery;  Laterality: N/A;   POLYPECTOMY     SPINAL CORD STIMULATOR BATTERY EXCHANGE N/A 09/14/2021   Procedure: Removal of spinal cord stimulator leads;  Surgeon: Tia Alert, MD;  Location: [facility] OR;  Service: Neurosurgery;  Laterality: N/A;   SPINAL CORD STIMULATOR INSERTION N/A 05/29/2014   Procedure: LUMBAR SPINAL CORD STIMULATOR INSERTION;  Surgeon: Gwynne Edinger, MD;  Location: [facility] NEURO ORS;  Service: Neurosurgery;  Laterality: N/A;   SPINAL CORD STIMULATOR INSERTION N/A 10/07/2021   Procedure: Spinal cord stimulator via Thoracic nine laminectomy;  Surgeon: Tia Alert, MD;  Location: [facility] OR;  Service: Neurosurgery;  Laterality: N/A;   TOTAL KNEE ARTHROPLASTY Right 02/08/2019   Procedure: TOTAL KNEE ARTHROPLASTY;  Surgeon: Durene Romans, MD;  Location: [facility] ORS;  Service: Orthopedics;  Laterality: Right;  70 mins    Current Medications: Current Meds  Medication Sig   acetaminophen (TYLENOL) 500 MG tablet Take 2 tablets (1,000 mg total) by mouth every 8 (eight) hours. (Patient taking differently: Take 1,000 mg by mouth every 6 (six) hours as needed for moderate pain (pain score 4-6).)   aspirin 81 MG chewable tablet Chew 81 mg by mouth at bedtime.   cetirizine (ZYRTEC) 10 MG tablet Take 10 mg by mouth daily.    famotidine (PEPCID) 20 MG tablet Take 20 mg by mouth daily.   hydrOXYzine (ATARAX) 25 MG tablet Take 25 mg by mouth at bedtime.   lisinopril-hydrochlorothiazide (ZESTORETIC) 10-12.5 MG tablet Take 1 tablet by mouth daily.   methocarbamol (ROBAXIN) 750 MG tablet Take 750 mg by mouth 3 (three) times daily.   morphine (MS CONTIN) 30 MG 12 hr tablet Take 30 mg by mouth every 12 (twelve) hours.   nitroGLYCERIN (NITROSTAT) 0.4 MG SL  tablet Place 1 tablet (0.4 mg total) under the tongue every 5 (five) minutes as needed for chest pain.   nystatin cream (MYCOSTATIN) Apply 1 application. topically daily as needed (yeast).   oxyCODONE (ROXICODONE) 15 MG immediate release tablet Take 15 mg by mouth every 4 (four) hours as needed for pain.   polyethylene glycol (MIRALAX / GLYCOLAX) 17 g packet Take 17 g by mouth 2 (two) times daily.   pravastatin (PRAVACHOL) 80 MG tablet Take 1 tablet (80 mg total) by mouth daily.   pregabalin (LYRICA) 75 MG capsule TAKE ONE CAPSULE BY MOUTH THREE TIMES DAILY  (Patient taking differently: Take 75-150 mg by mouth See admin instructions. Take 75 mg by mouth in the morning and 150 mg in the afternoon and bedtime)   sildenafil (VIAGRA) 100 MG tablet Take 100 mg by mouth daily as needed for  erectile dysfunction.   testosterone cypionate (DEPOTESTOSTERONE CYPIONATE) 200 MG/ML injection Inject 200 mg into the muscle every 14 (fourteen) days.   Vitamin D, Ergocalciferol, (DRISDOL) 50000 UNITS CAPS capsule Take 50,000 Units by mouth 2 (two) times a week.    zolpidem (AMBIEN) 10 MG tablet Take 10 mg by mouth at bedtime.     Allergies:   Gabapentin, Keppra [levetiracetam], Tape, and Valium [diazepam]   Social History   Socioeconomic History   Marital status: Married    Spouse name: Larita Fife   Number of children: 3   Years of education: Not on file   Highest education level: Some college, no degree  Occupational History   Not on file  Tobacco Use   Smoking status: Former    Types: Cigarettes    Passive exposure: Past   Smokeless tobacco: Former    Types: Chew    Quit date: 2013   Tobacco comments:    quit smoking cigarettes > 30 years ago  Vaping Use   Vaping status: Never Used  Substance and Sexual Activity   Alcohol use: Yes    Alcohol/week: 2.0 - 6.0 standard drinks of alcohol    Types: 2 - 3 Shots of liquor per week   Drug use: Never   Sexual activity: Yes  Other Topics Concern   Not on  file  Social History Narrative   Not on file   Social Determinants of Health   Financial Resource Strain: Low Risk  (10/11/2018)   Overall Financial Resource Strain (CARDIA)    Difficulty of Paying Living Expenses: Not very hard  Food Insecurity: Food Insecurity Present (10/11/2018)   Hunger Vital Sign    Worried About Running Out of Food in the Last Year: Sometimes true    Ran Out of Food in the Last Year: Sometimes true  Transportation Needs: No Transportation Needs (10/11/2018)   PRAPARE - Administrator, Civil Service (Medical): No    Lack of Transportation (Non-Medical): No  Physical Activity: Insufficiently Active (10/11/2018)   Exercise Vital Sign    Days of Exercise per Week: 2 days    Minutes of Exercise per Session: 20 min  Stress: Stress Concern Present (10/11/2018)   [employer] of Occupational Health - Occupational Stress Questionnaire    Feeling of Stress : To some extent  Social Connections: Unknown (10/11/2018)   Social Connection and Isolation Panel [NHANES]    Frequency of Communication with Friends and Family: Not on file    Frequency of Social Gatherings with Friends and Family: Not on file    Attends Religious Services: 1 to 4 times per year    Active Member of [employer] or Organizations: No    Attends Engineer, structural: Never    Marital Status: Married     Family History: The patient's family history includes Cancer in his father; Diabetes in his mother; Heart attack in his mother; Heart attack (age of onset: 58) in his brother; Heart disease in his brother; Hyperlipidemia in his brother and mother; Hypertension in his brother and mother. There is no history of Breast cancer, Colon polyps, Colon cancer, Esophageal cancer, Liver cancer, Ovarian cancer, Pancreatic cancer, Prostate cancer, Rectal cancer, or Stomach cancer.  ROS:   Please see the history of present illness.     All other systems reviewed and are negative.  EKGs/Labs/Other  Studies Reviewed:    The following studies were reviewed today:  N/A  EKG:  EKG is ordered today.  The ekg  ordered today demonstrates NSR 70bpm, TWI III  Recent Labs: 04/14/2022: BUN 18; Creatinine, Ser 0.80; Hemoglobin 18.0; Potassium 4.1; Sodium 134  Recent Lipid Panel No results found for: "CHOL", "TRIG", "HDL", "CHOLHDL", "VLDL", "LDLCALC", "LDLDIRECT"   Physical Exam:    VS:  BP (!) 142/80 (BP Location: Left Arm, Patient Position: Lying left side, Cuff Size: Normal)   Pulse 70   Ht 6\' 1"  (1.854 m)   Wt 254 lb 6.4 oz (115.4 kg)   SpO2 94%   BMI 33.56 kg/m     Wt Readings from Last 3 Encounters:  04/11/23 254 lb 6.4 oz (115.4 kg)  04/14/22 260 lb (117.9 kg)  04/06/22 266 lb 3.2 oz (120.7 kg)     GEN:  Well nourished, well developed in no acute distress HEENT: Normal NECK: No JVD; No carotid bruits LYMPHATICS: No lymphadenopathy CARDIAC: RRR, no murmurs, rubs, gallops RESPIRATORY:  Clear to auscultation without rales, wheezing or rhonchi  ABDOMEN: Soft, non-tender, non-distended MUSCULOSKELETAL:  No edema; No deformity  SKIN: Warm and dry NEUROLOGIC:  Alert and oriented x 3 PSYCHIATRIC:  Normal affect   ASSESSMENT:    1. Preop cardiovascular exam   2. Essential hypertension   3. Hyperlipidemia, mixed   4. Aortic atherosclerosis (HCC)    PLAN:    In order of problems listed above:  Pre-operative cardiac evaluation HTN HLD Aortic calcification/CAD  Patient reports he has a back surgery scheduled for December 6th. He is overall stable from a cardiac perspective. BP is mildly elevated, but this is likely driven by back pain. He uses a cane, but reports he is is able to walk 1-2 blocks and up a flight of stairs. He can perform moderate house work. He is taking ASA daily, Ok to hold per surgeon's instructions. METS>4. According to the RCRI he is class I risk, 3.9% 30 day risk of death, MI or cardiac arrest. No further work-up prior to surgery.   Disposition:  Follow up in 1 year(s) with MD/APP    Signed, Tudor Chandley David Stall, PA-C  04/11/2023 12:13 PM    [facility]

## 2023-04-27 ENCOUNTER — Encounter (HOSPITAL_COMMUNITY): Payer: Self-pay

## 2023-04-27 NOTE — Progress Notes (Addendum)
Surgical Instructions    Your procedure is scheduled on Friday, 05/06/23.  Report to [facility] Main Entrance "A" at 5:30 A.M., then check in with the Admitting office.  Call this number if you have problems the morning of surgery:  [phone]   If you have any questions prior to your surgery date call [phone]: Open Monday-Friday 8am-4pm If you experience any cold or flu symptoms such as cough, fever, chills, shortness of breath, etc. between now and your scheduled surgery, please notify us at the above number     Remember:  Do not eat or drink after midnight the night before your surgery-Thursday     Take these medicines the morning of surgery with A SIP OF WATER:  cetirizine (ZYRTEC)  famotidine (PEPCID)  methocarbamol (ROBAXIN)  morphine (MS CONTIN)  pravastatin (PRAVACHOL)  pregabalin (LYRICA)   acetaminophen (TYLENOL) or  oxyCODONE (ROXICODONE) if needed   Aspirin Instructions: Follow your surgeon's instructions on when to stop aspirin prior to surgery,  If no instructions were given by your surgeon then you will need to call the office for those instructions.   As of today, STOP taking any Aspirin (unless otherwise instructed by your surgeon) Aleve, Naproxen, Ibuprofen, Motrin, Advil, Goody's, BC's, all herbal medications, fish oil, and all vitamins.           Do not wear jewelry  Do not wear lotions, powders,cologne or deodorant. Men may shave face and neck. Do not bring valuables to the hospital.   [facility] is not responsible for any belongings or valuables.    Do NOT Smoke (Tobacco/Vaping)  24 hours prior to your procedure  If you use a CPAP at night, you may bring your mask for your overnight stay.   Contacts, glasses, hearing aids, dentures or partials may not be worn into surgery, please bring cases for these belongings   For patients admitted to the hospital, discharge time will be determined by your treatment team.     SURGICAL WAITING ROOM  VISITATION Patients having surgery or a procedure may have no more than 2 support people in the waiting area - these visitors may rotate.   Children under the age of 41 must have an adult with them who is not the patient. If the patient needs to stay at the hospital during part of their recovery, the visitor guidelines for inpatient rooms apply. Pre-op nurse will coordinate an appropriate time for 1 support person to accompany patient in pre-op.  This support person may not rotate.   Please refer to [email or web address] for the visitor guidelines for Inpatients (after your surgery is over and you are in a regular room).    Special instructions:    Oral Hygiene is also important to reduce your risk of infection.  Remember - BRUSH YOUR TEETH THE MORNING OF SURGERY WITH YOUR REGULAR TOOTHPASTE   If you received a COVID test during your pre-op visit, it is requested that you wear a mask when out in public, stay away from anyone that may not be feeling well, and notify your surgeon if you develop symptoms. If you have been in contact with anyone that has tested positive in the last 10 days, please notify your surgeon.        Pre-operative 5 CHG Bathing Instructions   You can play a key role in reducing the risk of infection after surgery. Your skin needs to be as free of germs as possible. You can reduce the number of germs on your skin  by washing with CHG (chlorhexidine gluconate) soap before surgery. CHG is an antiseptic soap that kills germs and continues to kill germs even after washing.   DO NOT use if you have an allergy to chlorhexidine/CHG or antibacterial soaps. If your skin becomes reddened or irritated, stop using the CHG and notify one of our RNs at [phone].   Please shower with the CHG soap starting 4 days before surgery using the following schedule:     Please keep in mind the following:  DO NOT shave, including legs and  underarms, starting the day of your first shower.   You may shave your face at any point before/day of surgery.  Place clean sheets on your bed the day you start using CHG soap. Use a clean washcloth (not used since being washed) for each shower. DO NOT sleep with pets once you start using the CHG.   CHG Shower Instructions:  Wash your face and private area with normal soap. If you choose to wash your hair, wash first with your normal shampoo.  After you use shampoo/soap, rinse your hair and body thoroughly to remove shampoo/soap residue.  Turn the water OFF and apply about 3 tablespoons (45 ml) of CHG soap to a CLEAN washcloth.  Apply CHG soap ONLY FROM YOUR NECK DOWN TO YOUR TOES (washing for 3-5 minutes)  DO NOT use CHG soap on face, private areas, open wounds, or sores.  Pay special attention to the area where your surgery is being performed.  If you are having back surgery, having someone wash your back for you may be helpful. Wait 2 minutes after CHG soap is applied, then you may rinse off the CHG soap.  Pat dry with a clean towel  Put on clean clothes/pajamas   If you choose to wear lotion, please use ONLY the CHG-compatible lotions on the back of this paper.   Additional instructions for the day of surgery: DO NOT APPLY any lotions, deodorants, cologne, or perfumes.   Do not bring valuables to the hospital. [facility] is not responsible for any belongings/valuables. Do not wear nail polish, gel polish, artificial nails, or any other type of covering on natural nails (fingers and toes) Do not wear jewelry or makeup Put on clean/comfortable clothes.  Please brush your teeth.  Ask your nurse before applying any prescription medications to the skin.     CHG Compatible Lotions   Aveeno Moisturizing lotion  Cetaphil Moisturizing Cream  Cetaphil Moisturizing Lotion  Clairol Herbal Essence Moisturizing Lotion, Dry Skin  Clairol Herbal Essence Moisturizing Lotion, Extra Dry  Skin  Clairol Herbal Essence Moisturizing Lotion, Normal Skin  Curel Age Defying Therapeutic Moisturizing Lotion with Alpha Hydroxy  Curel Extreme Care Body Lotion  Curel Soothing Hands Moisturizing Hand Lotion  Curel Therapeutic Moisturizing Cream, Fragrance-Free  Curel Therapeutic Moisturizing Lotion, Fragrance-Free  Curel Therapeutic Moisturizing Lotion, Original Formula  Eucerin Daily Replenishing Lotion  Eucerin Dry Skin Therapy Plus Alpha Hydroxy Crme  Eucerin Dry Skin Therapy Plus Alpha Hydroxy Lotion  Eucerin Original Crme  Eucerin Original Lotion  Eucerin Plus Crme Eucerin Plus Lotion  Eucerin TriLipid Replenishing Lotion  Keri Anti-Bacterial Hand Lotion  Keri Deep Conditioning Original Lotion Dry Skin Formula Softly Scented  Keri Deep Conditioning Original Lotion, Fragrance Free Sensitive Skin Formula  Keri Lotion Fast Absorbing Fragrance Free Sensitive Skin Formula  Keri Lotion Fast Absorbing Softly Scented Dry Skin Formula  Keri Original Lotion  Keri Skin Renewal Lotion Keri Silky Smooth Lotion  [place] Smooth  Sensitive Skin Lotion  Nivea Body Creamy Conditioning Oil  Nivea Body Extra Enriched Lotion  Nivea Body Original Lotion  Nivea Body Sheer Moisturizing Lotion Nivea Crme  Nivea Skin Firming Lotion  NutraDerm 30 Skin Lotion  NutraDerm Skin Lotion  NutraDerm Therapeutic Skin Cream  NutraDerm Therapeutic Skin Lotion  ProShield Protective Hand Cream  Provon moisturizing lotion  Please read over the following fact sheets that you were given.

## 2023-05-02 ENCOUNTER — Encounter (HOSPITAL_COMMUNITY): Payer: Self-pay

## 2023-05-02 ENCOUNTER — Other Ambulatory Visit: Payer: Self-pay

## 2023-05-02 ENCOUNTER — Encounter (HOSPITAL_COMMUNITY)
Admission: RE | Admit: 2023-05-02 | Discharge: 2023-05-02 | Disposition: A | Discharge status: Home / Self Care | Payer: Medicare Other | Source: Ambulatory Visit | Attending: Neurological Surgery | Admitting: Neurological Surgery

## 2023-05-02 VITALS — BP 155/83 | HR 64 | Temp 98.4°F | Resp 18 | Ht 73.0 in | Wt 251.5 lb

## 2023-05-02 DIAGNOSIS — E785 Hyperlipidemia, unspecified: Secondary | ICD-10-CM | POA: Diagnosis not present

## 2023-05-02 DIAGNOSIS — K219 Gastro-esophageal reflux disease without esophagitis: Secondary | ICD-10-CM | POA: Insufficient documentation

## 2023-05-02 DIAGNOSIS — Z01812 Encounter for preprocedural laboratory examination: Secondary | ICD-10-CM | POA: Diagnosis not present

## 2023-05-02 DIAGNOSIS — I10 Essential (primary) hypertension: Secondary | ICD-10-CM | POA: Insufficient documentation

## 2023-05-02 DIAGNOSIS — Z7982 Long term (current) use of aspirin: Secondary | ICD-10-CM | POA: Insufficient documentation

## 2023-05-02 DIAGNOSIS — Z01818 Encounter for other preprocedural examination: Secondary | ICD-10-CM

## 2023-05-02 DIAGNOSIS — I2510 Atherosclerotic heart disease of native coronary artery without angina pectoris: Secondary | ICD-10-CM | POA: Diagnosis not present

## 2023-05-02 HISTORY — DX: Male erectile dysfunction, unspecified: N52.9

## 2023-05-02 LAB — BASIC METABOLIC PANEL
Anion gap: 7 (ref 5–15)
BUN: 12 mg/dL (ref 8–23)
CO2: 28 mmol/L (ref 22–32)
Calcium: 9.1 mg/dL (ref 8.9–10.3)
Chloride: 100 mmol/L (ref 98–111)
Creatinine, Ser: 1.13 mg/dL (ref 0.61–1.24)
GFR, Estimated: >60 mL/min (ref >60)
Glucose, Bld: 110 mg/dL — ABNORMAL HIGH (ref 70–99)
Potassium: 3.7 mmol/L (ref 3.5–5.1)
Sodium: 135 mmol/L (ref 135–145)

## 2023-05-02 LAB — CBC
HCT: 50.4 % (ref 39.0–52.0)
Hemoglobin: 17 g/dL (ref 13.0–17.0)
MCH: 31.7 pg (ref 26.0–34.0)
MCHC: 33.7 g/dL (ref 30.0–36.0)
MCV: 94 fL (ref 80.0–100.0)
Platelets: 217 10*3/uL (ref 150–400)
RBC: 5.36 MIL/uL (ref 4.22–5.81)
RDW: 13.3 % (ref 11.5–15.5)
WBC: 6.2 10*3/uL (ref 4.0–10.5)
nRBC: 0 % (ref 0.0–0.2)

## 2023-05-02 LAB — TYPE AND SCREEN
ABO/RH(D): A POS
Antibody Screen: NEGATIVE

## 2023-05-02 LAB — SURGICAL PCR SCREEN
MRSA, PCR: NEGATIVE
Staphylococcus aureus: POSITIVE — AB

## 2023-05-02 LAB — PROTIME-INR
INR: 1.1 (ref 0.8–1.2)
Prothrombin Time: 14.3 s (ref 11.4–15.2)

## 2023-05-02 NOTE — Progress Notes (Signed)
PCP - Lonie Peak PA-C Cardiologist - Julien Nordmann, MD   PPM/ICD - denies Device Orders - n/a Rep Notified - n/a  Chest x-ray -  EKG -  Stress Test - 04-10-14 ECHO -  Cardiac Cath - 03-04-14  Sleep Study - Tested 5-6- years ago "borderline per patient" chose not to wear CPAP CPAP - n/a  DM Denies  Blood Thinner Instructions: denies Aspirin Instructions: Last dose 04-28-23  ERAS Protcol - NPO  COVID TEST-    Anesthesia review: yes  HX of Heart cath 2015, HTN.  Last cardiac visit 04-11-2023. Patient does have a back stimulator  Patient denies shortness of breath, fever, cough and chest pain at PAT appointment   All instructions explained to the patient, with a verbal understanding of the material. Patient agrees to go over the instructions while at home for a better understanding. Patient also instructed to self quarantine after being tested for COVID-19. The opportunity to ask questions was provided.

## 2023-05-03 NOTE — Progress Notes (Signed)
Anesthesia Chart Review:  72 yo male follows with cardiology for hx of HTN, HLD, nonobstructive CAD. Cath in 2015 for eval of chest pain showed no significant CAD and normal LV function. Seen by Cadence Fransico Michael, PA-C on 04/11/23 for preop eval. Per note, "Patient reports he has a back surgery scheduled for December 6th. He is overall stable from a cardiac perspective. BP is mildly elevated, but this is likely driven by back pain. He uses a cane, but reports he is is able to walk 1-2 blocks and up a flight of stairs. He can perform moderate house work. He is taking ASA daily, Ok to hold per surgeon's instructions. METS>4. According to the RCRI he is class I risk, 3.9% 30 day risk of death, MI or cardiac arrest. No further work-up prior to surgery. "  Other pertinent hx includes PONV, GERD.  Preop labs reviewed, WNL.  EKG 04/11/2023: NSR. Rate 70.   Zannie Cove [facility] Short Stay Center/Anesthesiology Phone [phone] 05/03/2023 1:36 PM

## 2023-05-03 NOTE — Anesthesia Preprocedure Evaluation (Addendum)
Anesthesia Evaluation  Patient identified by MRN, date of birth, ID band Patient awake    Reviewed: Allergy & Precautions, H&P , NPO status , Patient's Chart, lab work & pertinent test results  History of Anesthesia Complications (+) PONV and history of anesthetic complications  Airway Mallampati: II  TM Distance: >3 FB Neck ROM: Full    Dental no notable dental hx. (+) Teeth Intact, Dental Advisory Given, Partial Upper   Pulmonary neg pulmonary ROS, former smoker   Pulmonary exam normal breath sounds clear to auscultation       Cardiovascular Exercise Tolerance: Good hypertension, Pt. on medications + Peripheral Vascular Disease and + DOE   Rhythm:Regular Rate:Normal     Neuro/Psych  Headaches  negative psych ROS   GI/Hepatic Neg liver ROS,GERD  Medicated,,  Endo/Other  negative endocrine ROS    Renal/GU negative Renal ROS  negative genitourinary   Musculoskeletal  (+) Arthritis , Osteoarthritis,    Abdominal   Peds  Hematology negative hematology ROS (+)   Anesthesia Other Findings   Reproductive/Obstetrics negative OB ROS                             Anesthesia Physical Anesthesia Plan  ASA: 3  Anesthesia Plan: General   Post-op Pain Management: Tylenol PO (pre-op)*   Induction: Intravenous  PONV Risk Score and Plan: 4 or greater and Ondansetron, Dexamethasone and Treatment may vary due to age or medical condition  Airway Management Planned: Oral ETT  Additional Equipment:   Intra-op Plan:   Post-operative Plan: Extubation in OR  Informed Consent: I have reviewed the patients History and Physical, chart, labs and discussed the procedure including the risks, benefits and alternatives for the proposed anesthesia with the patient or authorized representative who has indicated his/her understanding and acceptance.     Dental advisory given  Plan Discussed with:  CRNA  Anesthesia Plan Comments: (PAT note by Antionette Poles, PA-C: 72 yo male follows with cardiology for hx of HTN, HLD, nonobstructive CAD. Cath in 2015 for eval of chest pain showed no significant CAD and normal LV function. Seen by Cadence Fransico Michael, PA-C on 04/11/23 for preop eval. Per note, "Patient reports he has a back surgery scheduled for December 6th. He is overall stable from a cardiac perspective. BP is mildly elevated, but this is likely driven by back pain. He uses a cane, but reports he is is able to walk 1-2 blocks and up a flight of stairs. He can perform moderate house work. He is taking ASA daily, Ok to hold per surgeon's instructions. METS>4. According to the RCRI he is class I risk, 3.9% 30 day risk of death, MI or cardiac arrest. No further work-up prior to surgery. "  Other pertinent hx includes PONV, GERD.  Preop labs reviewed, WNL.  EKG 04/11/2023: NSR. Rate 70.  )       Anesthesia Quick Evaluation

## 2023-05-06 ENCOUNTER — Inpatient Hospital Stay (HOSPITAL_COMMUNITY): Payer: Medicare Other | Admitting: Physician Assistant

## 2023-05-06 ENCOUNTER — Other Ambulatory Visit: Payer: Self-pay

## 2023-05-06 ENCOUNTER — Encounter (HOSPITAL_COMMUNITY)
Admission: RE | Disposition: A | Discharge status: Home / Self Care | Payer: Self-pay | Source: Home / Self Care | Attending: Neurological Surgery

## 2023-05-06 ENCOUNTER — Inpatient Hospital Stay (HOSPITAL_COMMUNITY)
Admission: RE | Admit: 2023-05-06 | Discharge: 2023-05-07 | DRG: 448 | Disposition: A | Discharge status: Home / Self Care | Payer: Medicare Other | Attending: Neurological Surgery | Admitting: Neurological Surgery

## 2023-05-06 ENCOUNTER — Encounter (HOSPITAL_COMMUNITY): Payer: Self-pay | Admitting: Neurological Surgery

## 2023-05-06 ENCOUNTER — Inpatient Hospital Stay (HOSPITAL_COMMUNITY): Payer: Medicare Other | Admitting: Anesthesiology

## 2023-05-06 ENCOUNTER — Inpatient Hospital Stay (HOSPITAL_COMMUNITY): Payer: Medicare Other

## 2023-05-06 DIAGNOSIS — M960 Pseudarthrosis after fusion or arthrodesis: Secondary | ICD-10-CM | POA: Diagnosis present

## 2023-05-06 DIAGNOSIS — Y752 Prosthetic and other implants, materials and neurological devices associated with adverse incidents: Secondary | ICD-10-CM | POA: Diagnosis present

## 2023-05-06 DIAGNOSIS — Z8249 Family history of ischemic heart disease and other diseases of the circulatory system: Secondary | ICD-10-CM

## 2023-05-06 DIAGNOSIS — Z833 Family history of diabetes mellitus: Secondary | ICD-10-CM

## 2023-05-06 DIAGNOSIS — Z7982 Long term (current) use of aspirin: Secondary | ICD-10-CM

## 2023-05-06 DIAGNOSIS — Z87891 Personal history of nicotine dependence: Secondary | ICD-10-CM

## 2023-05-06 DIAGNOSIS — T84038A Mechanical loosening of other internal prosthetic joint, initial encounter: Secondary | ICD-10-CM | POA: Diagnosis not present

## 2023-05-06 DIAGNOSIS — Z96651 Presence of right artificial knee joint: Secondary | ICD-10-CM | POA: Diagnosis present

## 2023-05-06 DIAGNOSIS — Z83438 Family history of other disorder of lipoprotein metabolism and other lipidemia: Secondary | ICD-10-CM | POA: Diagnosis not present

## 2023-05-06 DIAGNOSIS — T84226A Displacement of internal fixation device of vertebrae, initial encounter: Secondary | ICD-10-CM | POA: Diagnosis present

## 2023-05-06 DIAGNOSIS — Z79899 Other long term (current) drug therapy: Secondary | ICD-10-CM

## 2023-05-06 DIAGNOSIS — Z981 Arthrodesis status: Principal | ICD-10-CM

## 2023-05-06 HISTORY — PX: LAMINECTOMY WITH POSTERIOR LATERAL ARTHRODESIS LEVEL 1: SHX6335

## 2023-05-06 SURGERY — LAMINECTOMY WITH POSTERIOR LATERAL ARTHRODESIS LEVEL 1
Anesthesia: General | Site: Back

## 2023-05-06 MED ORDER — MORPHINE SULFATE (PF) 2 MG/ML IV SOLN: 2.0000 mg | INTRAVENOUS | Status: DC | PRN

## 2023-05-06 MED ORDER — ONDANSETRON HCL 4 MG/2ML IJ SOLN
INTRAMUSCULAR | Status: DC | PRN
  Administered 2023-05-06: 4 mg via INTRAVENOUS

## 2023-05-06 MED ORDER — SENNA 8.6 MG PO TABS
1.0000 | ORAL_TABLET | Freq: Two times a day (BID) | ORAL | Status: DC
  Administered 2023-05-06 – 2023-05-07 (×2): 8.6 mg via ORAL
  Filled 2023-05-06 (×2): qty 1

## 2023-05-06 MED ORDER — HYDROCHLOROTHIAZIDE 12.5 MG PO TABS
12.5000 mg | ORAL_TABLET | Freq: Every day | ORAL | Status: DC
  Administered 2023-05-06 – 2023-05-07 (×2): 12.5 mg via ORAL
  Filled 2023-05-06 (×2): qty 1

## 2023-05-06 MED ORDER — HYDROXYZINE HCL 25 MG PO TABS
25.0000 mg | ORAL_TABLET | Freq: Every day | ORAL | Status: DC
  Administered 2023-05-06: 25 mg via ORAL
  Filled 2023-05-06: qty 1

## 2023-05-06 MED ORDER — SODIUM CHLORIDE 0.9 % IV SOLN
250.0000 mL | INTRAVENOUS | Status: AC
  Administered 2023-05-06: 250 mL via INTRAVENOUS

## 2023-05-06 MED ORDER — MENTHOL 3 MG MT LOZG: 1.0000 | LOZENGE | OROMUCOSAL | Status: DC | PRN

## 2023-05-06 MED ORDER — PROPOFOL 10 MG/ML IV BOLUS
INTRAVENOUS | Status: DC | PRN
  Administered 2023-05-06: 130 mg via INTRAVENOUS

## 2023-05-06 MED ORDER — ROCURONIUM BROMIDE 10 MG/ML (PF) SYRINGE
PREFILLED_SYRINGE | INTRAVENOUS | Status: DC | PRN
  Administered 2023-05-06: 70 mg via INTRAVENOUS

## 2023-05-06 MED ORDER — LACTATED RINGERS IV SOLN: INTRAVENOUS | Status: DC | PRN

## 2023-05-06 MED ORDER — ACETAMINOPHEN 10 MG/ML IV SOLN
INTRAVENOUS | Status: AC
  Filled 2023-05-06: qty 100

## 2023-05-06 MED ORDER — CEFAZOLIN SODIUM-DEXTROSE 2-4 GM/100ML-% IV SOLN
2.0000 g | INTRAVENOUS | Status: AC
  Administered 2023-05-06: 2 g via INTRAVENOUS
  Filled 2023-05-06: qty 100

## 2023-05-06 MED ORDER — ROCURONIUM BROMIDE 10 MG/ML (PF) SYRINGE
PREFILLED_SYRINGE | INTRAVENOUS | Status: AC
  Filled 2023-05-06: qty 10

## 2023-05-06 MED ORDER — CHLORHEXIDINE GLUCONATE CLOTH 2 % EX PADS
6.0000 | MEDICATED_PAD | Freq: Once | CUTANEOUS | Status: DC
Start: 2023-05-06 — End: 2023-05-06

## 2023-05-06 MED ORDER — CELECOXIB 200 MG PO CAPS
200.0000 mg | ORAL_CAPSULE | Freq: Two times a day (BID) | ORAL | Status: DC
  Administered 2023-05-06 – 2023-05-07 (×3): 200 mg via ORAL
  Filled 2023-05-06 (×3): qty 1

## 2023-05-06 MED ORDER — 0.9 % SODIUM CHLORIDE (POUR BTL) OPTIME
TOPICAL | Status: DC | PRN
Start: 2023-05-06 — End: 2023-05-06
  Administered 2023-05-06: 1000 mL

## 2023-05-06 MED ORDER — PREGABALIN 75 MG PO CAPS
150.0000 mg | ORAL_CAPSULE | Freq: Two times a day (BID) | ORAL | Status: DC
  Administered 2023-05-06 (×2): 150 mg via ORAL
  Filled 2023-05-06 (×2): qty 2

## 2023-05-06 MED ORDER — EPHEDRINE SULFATE-NACL 50-0.9 MG/10ML-% IV SOSY
PREFILLED_SYRINGE | INTRAVENOUS | Status: DC | PRN
  Administered 2023-05-06: 5 mg via INTRAVENOUS
  Administered 2023-05-06: 10 mg via INTRAVENOUS

## 2023-05-06 MED ORDER — CHLORHEXIDINE GLUCONATE 0.12 % MT SOLN: 15.0000 mL | Freq: Once | OROMUCOSAL | Status: AC

## 2023-05-06 MED ORDER — HYDROMORPHONE HCL 1 MG/ML IJ SOLN
INTRAMUSCULAR | Status: AC
  Filled 2023-05-06: qty 1

## 2023-05-06 MED ORDER — KETAMINE HCL 10 MG/ML IJ SOLN
INTRAMUSCULAR | Status: DC | PRN
  Administered 2023-05-06: 40 mg via INTRAVENOUS

## 2023-05-06 MED ORDER — BUPIVACAINE HCL (PF) 0.25 % IJ SOLN
INTRAMUSCULAR | Status: AC
  Filled 2023-05-06: qty 30

## 2023-05-06 MED ORDER — ONDANSETRON HCL 4 MG/2ML IJ SOLN
4.0000 mg | Freq: Four times a day (QID) | INTRAMUSCULAR | Status: DC | PRN
  Administered 2023-05-06: 4 mg via INTRAVENOUS
  Filled 2023-05-06: qty 2

## 2023-05-06 MED ORDER — FENTANYL CITRATE (PF) 250 MCG/5ML IJ SOLN
INTRAMUSCULAR | Status: AC
  Filled 2023-05-06: qty 5

## 2023-05-06 MED ORDER — ACETAMINOPHEN 500 MG PO TABS
1000.0000 mg | ORAL_TABLET | Freq: Four times a day (QID) | ORAL | Status: AC
  Administered 2023-05-06 – 2023-05-07 (×4): 1000 mg via ORAL
  Filled 2023-05-06 (×4): qty 2

## 2023-05-06 MED ORDER — PROPOFOL 10 MG/ML IV BOLUS
INTRAVENOUS | Status: AC
  Filled 2023-05-06: qty 20

## 2023-05-06 MED ORDER — VASHE WOUND IRRIGATION OPTIME
TOPICAL | Status: DC | PRN
Start: 2023-05-06 — End: 2023-05-06
  Administered 2023-05-06: 34 [oz_av]

## 2023-05-06 MED ORDER — CHLORHEXIDINE GLUCONATE CLOTH 2 % EX PADS: 6.0000 | MEDICATED_PAD | Freq: Once | CUTANEOUS | Status: DC

## 2023-05-06 MED ORDER — ONDANSETRON HCL 4 MG/2ML IJ SOLN
INTRAMUSCULAR | Status: AC
  Filled 2023-05-06: qty 2

## 2023-05-06 MED ORDER — SUGAMMADEX SODIUM 200 MG/2ML IV SOLN
INTRAVENOUS | Status: DC | PRN
  Administered 2023-05-06: 200 mg via INTRAVENOUS

## 2023-05-06 MED ORDER — CHLORHEXIDINE GLUCONATE 0.12 % MT SOLN
OROMUCOSAL | Status: AC
  Administered 2023-05-06: 15 mL via OROMUCOSAL
  Filled 2023-05-06: qty 15

## 2023-05-06 MED ORDER — MAGNESIUM SULFATE 50 % IJ SOLN: INTRAMUSCULAR | Status: DC | PRN

## 2023-05-06 MED ORDER — THROMBIN 5000 UNITS EX SOLR
CUTANEOUS | Status: AC
  Filled 2023-05-06: qty 5000

## 2023-05-06 MED ORDER — GLYCOPYRROLATE PF 0.2 MG/ML IJ SOSY
PREFILLED_SYRINGE | INTRAMUSCULAR | Status: DC | PRN
  Administered 2023-05-06: .2 mg via INTRAVENOUS

## 2023-05-06 MED ORDER — ONDANSETRON HCL 4 MG PO TABS: 4.0000 mg | ORAL_TABLET | Freq: Four times a day (QID) | ORAL | Status: DC | PRN

## 2023-05-06 MED ORDER — MIDAZOLAM HCL 2 MG/2ML IJ SOLN
INTRAMUSCULAR | Status: DC | PRN
  Administered 2023-05-06: 1 mg via INTRAVENOUS

## 2023-05-06 MED ORDER — HYDROMORPHONE HCL 1 MG/ML IJ SOLN
0.2500 mg | INTRAMUSCULAR | Status: DC | PRN
Start: 2023-05-06 — End: 2023-05-06
  Administered 2023-05-06 (×4): 0.5 mg via INTRAVENOUS

## 2023-05-06 MED ORDER — EPHEDRINE 5 MG/ML INJ
INTRAVENOUS | Status: AC
  Filled 2023-05-06: qty 5

## 2023-05-06 MED ORDER — SODIUM CHLORIDE 0.9% FLUSH: 3.0000 mL | INTRAVENOUS | Status: DC | PRN

## 2023-05-06 MED ORDER — GLYCOPYRROLATE PF 0.2 MG/ML IJ SOSY
PREFILLED_SYRINGE | INTRAMUSCULAR | Status: AC
  Filled 2023-05-06: qty 1

## 2023-05-06 MED ORDER — MAGNESIUM SULFATE 50 % IJ SOLN
INTRAMUSCULAR | Status: DC | PRN
Start: 2023-05-06 — End: 2023-05-06
  Administered 2023-05-06: 1 g via INTRAVENOUS

## 2023-05-06 MED ORDER — LISINOPRIL-HYDROCHLOROTHIAZIDE 10-12.5 MG PO TABS: 1.0000 | ORAL_TABLET | Freq: Every day | ORAL | Status: DC

## 2023-05-06 MED ORDER — POLYETHYLENE GLYCOL 3350 17 G PO PACK
17.0000 g | PACK | Freq: Two times a day (BID) | ORAL | Status: DC
  Administered 2023-05-06 – 2023-05-07 (×3): 17 g via ORAL
  Filled 2023-05-06 (×3): qty 1

## 2023-05-06 MED ORDER — LIDOCAINE 2% (20 MG/ML) 5 ML SYRINGE
INTRAMUSCULAR | Status: AC
  Filled 2023-05-06: qty 5

## 2023-05-06 MED ORDER — LIDOCAINE 2% (20 MG/ML) 5 ML SYRINGE
INTRAMUSCULAR | Status: DC | PRN
  Administered 2023-05-06: 40 mg via INTRAVENOUS

## 2023-05-06 MED ORDER — CEFAZOLIN SODIUM-DEXTROSE 2-4 GM/100ML-% IV SOLN
2.0000 g | Freq: Three times a day (TID) | INTRAVENOUS | Status: AC
  Administered 2023-05-06 (×2): 2 g via INTRAVENOUS
  Filled 2023-05-06 (×2): qty 100

## 2023-05-06 MED ORDER — ASPIRIN 81 MG PO CHEW
81.0000 mg | CHEWABLE_TABLET | Freq: Every day | ORAL | Status: DC
  Administered 2023-05-06: 81 mg via ORAL
  Filled 2023-05-06: qty 1

## 2023-05-06 MED ORDER — FENTANYL CITRATE (PF) 250 MCG/5ML IJ SOLN
INTRAMUSCULAR | Status: DC | PRN
  Administered 2023-05-06: 50 ug via INTRAVENOUS
  Administered 2023-05-06 (×2): 100 ug via INTRAVENOUS

## 2023-05-06 MED ORDER — OXYCODONE HCL 5 MG PO TABS
15.0000 mg | ORAL_TABLET | ORAL | Status: DC | PRN
  Administered 2023-05-06 (×4): 15 mg via ORAL
  Filled 2023-05-06 (×5): qty 3

## 2023-05-06 MED ORDER — PREGABALIN 75 MG PO CAPS: 75.0000 mg | ORAL_CAPSULE | Freq: Three times a day (TID) | ORAL | Status: DC

## 2023-05-06 MED ORDER — MORPHINE SULFATE ER 15 MG PO TBCR
30.0000 mg | EXTENDED_RELEASE_TABLET | Freq: Two times a day (BID) | ORAL | Status: DC
  Administered 2023-05-06 – 2023-05-07 (×2): 30 mg via ORAL
  Filled 2023-05-06 (×2): qty 2

## 2023-05-06 MED ORDER — LISINOPRIL 10 MG PO TABS
10.0000 mg | ORAL_TABLET | Freq: Every day | ORAL | Status: DC
  Administered 2023-05-06 – 2023-05-07 (×2): 10 mg via ORAL
  Filled 2023-05-06 (×2): qty 1

## 2023-05-06 MED ORDER — SODIUM CHLORIDE 0.9% FLUSH
3.0000 mL | Freq: Two times a day (BID) | INTRAVENOUS | Status: DC
  Administered 2023-05-06 – 2023-05-07 (×3): 3 mL via INTRAVENOUS

## 2023-05-06 MED ORDER — METHOCARBAMOL 750 MG PO TABS
750.0000 mg | ORAL_TABLET | Freq: Three times a day (TID) | ORAL | Status: DC
  Administered 2023-05-06 – 2023-05-07 (×4): 750 mg via ORAL
  Filled 2023-05-06 (×4): qty 1

## 2023-05-06 MED ORDER — ACETAMINOPHEN 500 MG PO TABS
1000.0000 mg | ORAL_TABLET | Freq: Once | ORAL | Status: AC
  Administered 2023-05-06: 1000 mg via ORAL
  Filled 2023-05-06: qty 2

## 2023-05-06 MED ORDER — PHENYLEPHRINE HCL-NACL 20-0.9 MG/250ML-% IV SOLN
INTRAVENOUS | Status: DC | PRN
  Administered 2023-05-06: 50 ug/min via INTRAVENOUS

## 2023-05-06 MED ORDER — ORAL CARE MOUTH RINSE: 15.0000 mL | Freq: Once | OROMUCOSAL | Status: AC

## 2023-05-06 MED ORDER — PREGABALIN 75 MG PO CAPS
75.0000 mg | ORAL_CAPSULE | Freq: Every day | ORAL | Status: DC
  Administered 2023-05-07: 75 mg via ORAL
  Filled 2023-05-06: qty 1

## 2023-05-06 MED ORDER — KCL IN DEXTROSE-NACL 10-5-0.45 MEQ/L-%-% IV SOLN
INTRAVENOUS | Status: AC
  Filled 2023-05-06: qty 1000

## 2023-05-06 MED ORDER — THROMBIN 20000 UNITS EX SOLR
CUTANEOUS | Status: DC | PRN
  Administered 2023-05-06: 20 mL via TOPICAL

## 2023-05-06 MED ORDER — THROMBIN 5000 UNITS EX SOLR
OROMUCOSAL | Status: DC | PRN
  Administered 2023-05-06: 5 mL via TOPICAL

## 2023-05-06 MED ORDER — PHENOL 1.4 % MT LIQD: 1.0000 | OROMUCOSAL | Status: DC | PRN

## 2023-05-06 MED ORDER — KETAMINE HCL 50 MG/5ML IJ SOSY
PREFILLED_SYRINGE | INTRAMUSCULAR | Status: AC
  Filled 2023-05-06: qty 5

## 2023-05-06 MED ORDER — FAMOTIDINE 20 MG PO TABS
20.0000 mg | ORAL_TABLET | Freq: Every day | ORAL | Status: DC
  Administered 2023-05-07: 20 mg via ORAL
  Filled 2023-05-06: qty 1

## 2023-05-06 MED ORDER — NITROGLYCERIN 0.4 MG SL SUBL: 0.4000 mg | SUBLINGUAL_TABLET | SUBLINGUAL | Status: DC | PRN

## 2023-05-06 MED ORDER — THROMBIN 20000 UNITS EX SOLR
CUTANEOUS | Status: AC
Start: 2023-05-06 — End: ?
  Filled 2023-05-06: qty 20000

## 2023-05-06 MED ORDER — BUPIVACAINE HCL (PF) 0.25 % IJ SOLN
INTRAMUSCULAR | Status: DC | PRN
  Administered 2023-05-06: 5 mL

## 2023-05-06 MED ORDER — MIDAZOLAM HCL 2 MG/2ML IJ SOLN
INTRAMUSCULAR | Status: AC
  Filled 2023-05-06: qty 2

## 2023-05-06 SURGICAL SUPPLY — 47 items
ALLOGRAFT BONE FIBER KORE 5 (Bone Implant) IMPLANT
BAG COUNTER SPONGE SURGICOUNT (BAG) ×2 IMPLANT
BASKET BONE COLLECTION (BASKET) IMPLANT
BENZOIN TINCTURE PRP APPL 2/3 (GAUZE/BANDAGES/DRESSINGS) ×2 IMPLANT
BLADE BONE MILL MEDIUM (MISCELLANEOUS) IMPLANT
BLADE CLIPPER SURG (BLADE) IMPLANT
BUR CARBIDE MATCH 3.0 (BURR) ×2 IMPLANT
CANISTER SUCT 3000ML PPV (MISCELLANEOUS) ×2 IMPLANT
CLEANSER WND VASHE INSTL 34OZ (WOUND CARE) IMPLANT
CNTNR URN SCR LID CUP LEK RST (MISCELLANEOUS) ×2 IMPLANT
COVER BACK TABLE 60X90IN (DRAPES) ×2 IMPLANT
DERMABOND ADVANCED .7 DNX12 (GAUZE/BANDAGES/DRESSINGS) IMPLANT
DRAPE C-ARM 42X72 X-RAY (DRAPES) IMPLANT
DRAPE LAPAROTOMY 100X72X124 (DRAPES) ×2 IMPLANT
DRAPE SURG 17X23 STRL (DRAPES) ×2 IMPLANT
DURAPREP 26ML APPLICATOR (WOUND CARE) ×2 IMPLANT
ELECT REM PT RETURN 9FT ADLT (ELECTROSURGICAL) ×1
ELECTRODE REM PT RTRN 9FT ADLT (ELECTROSURGICAL) ×2 IMPLANT
EVACUATOR 1/8 PVC DRAIN (DRAIN) IMPLANT
GAUZE 4X4 16PLY ~~LOC~~+RFID DBL (SPONGE) IMPLANT
GLOVE BIO SURGEON STRL SZ7 (GLOVE) IMPLANT
GLOVE BIO SURGEON STRL SZ8 (GLOVE) ×4 IMPLANT
GLOVE BIOGEL PI IND STRL 7.0 (GLOVE) IMPLANT
GOWN STRL REUS W/ TWL LRG LVL3 (GOWN DISPOSABLE) IMPLANT
GOWN STRL REUS W/ TWL XL LVL3 (GOWN DISPOSABLE) ×4 IMPLANT
GOWN STRL REUS W/TWL 2XL LVL3 (GOWN DISPOSABLE) IMPLANT
GRAFT BN 10X1XDBM MAGNIFUSE (Bone Implant) IMPLANT
HEMOSTAT POWDER KIT SURGIFOAM (HEMOSTASIS) ×2 IMPLANT
KIT BASIN OR (CUSTOM PROCEDURE TRAY) ×2 IMPLANT
KIT INFUSE SMALL (Orthopedic Implant) IMPLANT
KIT TURNOVER KIT B (KITS) ×2 IMPLANT
MILL BONE PREP (MISCELLANEOUS) IMPLANT
NDL HYPO 25X1 1.5 SAFETY (NEEDLE) ×2 IMPLANT
NEEDLE HYPO 25X1 1.5 SAFETY (NEEDLE) ×1
NS IRRIG 1000ML POUR BTL (IV SOLUTION) ×2 IMPLANT
PACK LAMINECTOMY NEURO (CUSTOM PROCEDURE TRAY) ×2 IMPLANT
PAD ARMBOARD 7.5X6 YLW CONV (MISCELLANEOUS) ×6 IMPLANT
SOLUTION IRRIG SURGIPHOR (IV SOLUTION) ×2 IMPLANT
SPONGE SURGIFOAM ABS GEL 100 (HEMOSTASIS) ×2 IMPLANT
SPONGE T-LAP 4X18 ~~LOC~~+RFID (SPONGE) IMPLANT
STRIP CLOSURE SKIN 1/2X4 (GAUZE/BANDAGES/DRESSINGS) ×4 IMPLANT
SUT VIC AB 0 CT1 18XCR BRD8 (SUTURE) ×2 IMPLANT
SUT VIC AB 2-0 CP2 18 (SUTURE) ×2 IMPLANT
SUT VIC AB 3-0 SH 8-18 (SUTURE) ×4 IMPLANT
TOWEL GREEN STERILE (TOWEL DISPOSABLE) ×2 IMPLANT
TOWEL GREEN STERILE FF (TOWEL DISPOSABLE) ×2 IMPLANT
WATER STERILE IRR 1000ML POUR (IV SOLUTION) ×2 IMPLANT

## 2023-05-06 NOTE — Transfer of Care (Signed)
Immediate Anesthesia Transfer of Care Note  Patient: Darin Gomez  Procedure(s) Performed: Extraction of instrumentation Lumbar one-three,  posterolateral fusion Lumbar one-two, two-three (Back)  Patient Location: PACU  Anesthesia Type:General  Level of Consciousness: awake, drowsy, patient cooperative, and responds to stimulation  Airway & Oxygen Therapy: Patient Spontanous Breathing and Patient connected to face mask oxygen  Post-op Assessment: Report given to RN and Post -op Vital signs reviewed and stable  Post vital signs: Reviewed and stable  Last Vitals:  Vitals Value Taken Time  BP 147/89 05/06/23 0930  Temp    Pulse 68 05/06/23 0931  Resp 13 05/06/23 0931  SpO2 97 % 05/06/23 0931  Vitals shown include unfiled device data.  Last Pain:  Vitals:   05/06/23 0652  TempSrc:   PainSc: 5       Patients Stated Pain Goal: 1 (05/06/23 5284)  Complications: No notable events documented.

## 2023-05-06 NOTE — Plan of Care (Signed)

## 2023-05-06 NOTE — Anesthesia Postprocedure Evaluation (Signed)
Anesthesia Post Note  Patient: Darin Gomez  Procedure(s) Performed: Extraction of instrumentation Lumbar one-three,  posterolateral fusion Lumbar one-two, two-three (Back)     Patient location during evaluation: PACU Anesthesia Type: General Level of consciousness: awake and alert Pain management: pain level controlled Vital Signs Assessment: post-procedure vital signs reviewed and stable Respiratory status: spontaneous breathing, nonlabored ventilation and respiratory function stable Cardiovascular status: blood pressure returned to baseline and stable Postop Assessment: no apparent nausea or vomiting Anesthetic complications: no  No notable events documented.  Last Vitals:  Vitals:   05/06/23 1030 05/06/23 1052  BP: (!) 146/85 (!) 154/79  Pulse: (!) 59 (!) 54  Resp: 11 20  Temp:  37.1 C  SpO2: 99% 98%    Last Pain:  Vitals:   05/06/23 1030  TempSrc:   PainSc: 6                  Gordon Carlson,W. EDMOND

## 2023-05-06 NOTE — H&P (Signed)
Subjective: Patient is a 72 y.o. male admitted for pseudoarthrosis. Onset of symptoms was several months ago, gradually worsening since that time.  The pain is rated severe, and is located at the across the lower back and radiates to hips. The pain is described as aching and occurs all day. The symptoms have been progressive. Symptoms are exacerbated by exercise and standing. MRI or CT showed pseudoarthrosis with loosening of hardware L1-3   Past Medical History:  Diagnosis Date   Allergy    Arthritis    mainly in back   Back pain    Balance disorder    uses cane daily   Blood in urine    Chest pain    Constipation    Diverticulosis 02/14/2018   noted on colonoscopy   DOE (dyspnea on exertion)    ED (erectile dysfunction)    Environmental allergies    Foot swelling    Bilateral   GERD (gastroesophageal reflux disease)    Headache(784.0)    occas.  migraines  (from last surgery)   Heart murmur    as child....Marland Kitchenno problems as an adult   History of colon polyps 02/14/2018   Hyperlipidemia    Hypertension    dx  10 yrs or more  ago   Insomnia    Neck pain    Neuromuscular disorder (HCC)    bilateral feet   Night sweats    Nonobstructive CAD (coronary artery disease)    a. 01/2014 MV: mild lateral ischemia. EF 56%; b. 02/2014 Cath: LM nl, LAD nl, LCX min irregs/<10%m, RCA mild ectasia mid vessel, otw nl, EF 55-65%.   Painful swelling of joint    PONV (postoperative nausea and vomiting)    Poor circulation of extremity    legs   Sinus disorder    Spinal headache    Vertigo     Past Surgical History:  Procedure Laterality Date   ABDOMINAL EXPOSURE N/A 09/14/2019   Procedure: ABDOMINAL EXPOSURE;  Surgeon: Cephus Shelling, MD;  Location: [facility] OR;  Service: Vascular;  Laterality: N/A;   ANTERIOR LAT LUMBAR FUSION Right 09/14/2019   Procedure: Right Lumbar three-four Lumbar four-five  Anterolateral lumbar interbody fusion;  Surgeon: Maeola Harman, MD;  Location: [facility] OR;  Service:  Neurosurgery;  Laterality: Right;   ANTERIOR LUMBAR FUSION N/A 09/14/2019   Procedure: Lumbar five Sacral one Anterior lumbar interbody fusion;  Surgeon: Maeola Harman, MD;  Location: [facility] OR;  Service: Neurosurgery;  Laterality: N/A;   BACK SURGERY     CARDIAC CATHETERIZATION     03/04/14   COLONOSCOPY     FRACTURE SURGERY     left arm   KNEE SURGERY Right 01/02/2018   torn minicus   LAMINECTOMY WITH POSTERIOR LATERAL ARTHRODESIS LEVEL 2 N/A 04/14/2022   Procedure: Posterior lateral fusion - Lumbar one-three, extension of instrumentation Lumbar one-three, exploration of fusion;  Surgeon: Tia Alert, MD;  Location: [facility] OR;  Service: Neurosurgery;  Laterality: N/A;   LEFT HEART CATHETERIZATION WITH CORONARY ANGIOGRAM N/A 03/04/2014   Procedure: LEFT HEART CATHETERIZATION WITH CORONARY ANGIOGRAM;  Surgeon: Peter M Swaziland, MD;  Location: [facility] CATH LAB;  Service: Cardiovascular;  Laterality: N/A;   LUMBAR LAMINECTOMY/DECOMPRESSION MICRODISCECTOMY  03/15/2012   Procedure: LUMBAR LAMINECTOMY/DECOMPRESSION MICRODISCECTOMY 2 LEVELS;  Surgeon: Karn Cassis, MD;  Location: [facility] NEURO ORS;  Service: Neurosurgery;  Laterality: Bilateral;  Bilateral Lumbar three, lumbar four, lumbar five Laminectomy   LUMBAR PERCUTANEOUS PEDICLE SCREW 3 LEVEL N/A 09/14/2019   Procedure: Lumbar three  to Sacral one posterior pedicle screw fixation;  Surgeon: Maeola Harman, MD;  Location: [facility] OR;  Service: Neurosurgery;  Laterality: N/A;   POLYPECTOMY     SPINAL CORD STIMULATOR BATTERY EXCHANGE N/A 09/14/2021   Procedure: Removal of spinal cord stimulator leads;  Surgeon: Tia Alert, MD;  Location: [facility] OR;  Service: Neurosurgery;  Laterality: N/A;   SPINAL CORD STIMULATOR INSERTION N/A 05/29/2014   Procedure: LUMBAR SPINAL CORD STIMULATOR INSERTION;  Surgeon: Gwynne Edinger, MD;  Location: [facility] NEUR[facility] ORS;  Service: Neurosurgery;  Laterality: N/A;   SPINAL CORD STIMULATOR INSERTION N/A 10/07/2021   Procedure: Spinal cord  stimulator via Thoracic nine laminectomy;  Surgeon: Tia Alert, MD;  Location: [facility] OR;  Service: Neurosurgery;  Laterality: N/A;   TOTAL KNEE ARTHROPLASTY Right 02/08/2019   Procedure: TOTAL KNEE ARTHROPLASTY;  Surgeon: Durene Romans, MD;  Location: [facility] ORS;  Service: Orthopedics;  Laterality: Right;  70 mins    Prior to Admission medications   Medication Sig Start Date End Date Taking? Authorizing Provider  acetaminophen (TYLENOL) 500 MG tablet Take 2 tablets (1,000 mg total) by mouth every 8 (eight) hours. Patient taking differently: Take 1,000 mg by mouth every 6 (six) hours as needed for moderate pain (pain score 4-6). 02/09/19  Yes Lanney Gins, PA-C  aspirin 81 MG chewable tablet Chew 81 mg by mouth at bedtime.   Yes [provider]  cetirizine (ZYRTEC) 10 MG tablet Take 10 mg by mouth daily.    Yes [provider]  famotidine (PEPCID) 20 MG tablet Take 20 mg by mouth daily.   Yes [provider]  hydrOXYzine (ATARAX) 25 MG tablet Take 25 mg by mouth at bedtime.   Yes [provider]  lisinopril-hydrochlorothiazide (ZESTORETIC) 10-12.5 MG tablet Take 1 tablet by mouth daily. 01/27/21  Yes Gollan, Tollie Pizza, MD  methocarbamol (ROBAXIN) 750 MG tablet Take 750 mg by mouth 3 (three) times daily. 11/20/20  Yes [provider]  morphine (MS CONTIN) 30 MG 12 hr tablet Take 30 mg by mouth every 12 (twelve) hours.   Yes [provider]  oxyCODONE (ROXICODONE) 15 MG immediate release tablet Take 15 mg by mouth every 4 (four) hours as needed for pain.   Yes [provider]  polyethylene glycol (MIRALAX / GLYCOLAX) 17 g packet Take 17 g by mouth 2 (two) times daily. 02/09/19  Yes Babish, Molli Hazard, PA-C  pravastatin (PRAVACHOL) 80 MG tablet Take 1 tablet (80 mg total) by mouth daily. 01/27/21  Yes Gollan, Tollie Pizza, MD  pregabalin (LYRICA) 75 MG capsule TAKE ONE CAPSULE BY MOUTH THREE TIMES DAILY  Patient taking differently: Take 75-150 mg by  mouth See admin instructions. Take 75 mg by mouth in the morning and 150 mg in the afternoon and bedtime 12/13/18  Yes Edward Jolly, MD  testosterone cypionate (DEPOTESTOSTERONE CYPIONATE) 200 MG/ML injection Inject 200 mg into the muscle every 14 (fourteen) days. 08/21/19  Yes [provider]  Vitamin D, Ergocalciferol, (DRISDOL) 50000 UNITS CAPS capsule Take 50,000 Units by mouth 2 (two) times a week.    Yes [provider]  zolpidem (AMBIEN) 10 MG tablet Take 10 mg by mouth at bedtime.   Yes [provider]  nitroGLYCERIN (NITROSTAT) 0.4 MG SL tablet Place 1 tablet (0.4 mg total) under the tongue every 5 (five) minutes as needed for chest pain. 01/27/21   Antonieta Iba, MD  nystatin cream (MYCOSTATIN) Apply 1 application. topically daily as needed (yeast).    [provider]  sildenafil (VIAGRA) 100 MG tablet Take 100 mg by mouth daily as needed for erectile dysfunction.    [provider]   Allergies  Allergen Reactions   Gabapentin Swelling   Keppra [Levetiracetam] Nausea And Vomiting   Tape Rash    Plastic tape: Paper tape ONLY!    Valium [Diazepam] Other (See Comments)    Significant depression; "a different person"    Social History   Tobacco Use   Smoking status: Former    Types: Cigarettes    Passive exposure: Past   Smokeless tobacco: Former    Types: Chew    Quit date: 2013   Tobacco comments:    quit smoking cigarettes > 30 years ago  Substance Use Topics   Alcohol use: Yes    Alcohol/week: 2.0 - 6.0 standard drinks of alcohol    Types: 2 - 3 Shots of liquor per week    Family History  Problem Relation Age of Onset   Heart attack Mother    Hypertension Mother    Hyperlipidemia Mother    Diabetes Mother    Cancer Father    Heart attack Brother 80   Hyperlipidemia Brother    Hypertension Brother    Heart disease Brother    Breast cancer Neg Hx    Colon polyps Neg Hx    Colon cancer Neg Hx    Esophageal cancer  Neg Hx    Liver cancer Neg Hx    Ovarian cancer Neg Hx    Pancreatic cancer Neg Hx    Prostate cancer Neg Hx    Rectal cancer Neg Hx    Stomach cancer Neg Hx      Review of Systems  Positive ROS: neg  All other systems have been reviewed and were otherwise negative with the exception of those mentioned in the HPI and as above.  Objective: Vital signs in last 24 hours: Temp:  [98.4 F (36.9 C)] 98.4 F (36.9 C) (12/06 0601) Pulse Rate:  [60] 60 (12/06 0601) Resp:  [17] 17 (12/06 0601) BP: (151)/(88) 151/88 (12/06 0601) SpO2:  [97 %] 97 % (12/06 0601) Weight:  [110.2 kg] 110.2 kg (12/06 0601)  General Appearance: Alert, cooperative, no distress, appears stated age Head: Normocephalic, without obvious abnormality, atraumatic Eyes: PERRL, conjunctiva/corneas clear, EOM's intact    Neck: Supple, symmetrical, trachea midline Back: Symmetric, no curvature, ROM normal, no CVA tenderness Lungs:  respirations unlabored Heart: Regular rate and rhythm Abdomen: Soft, non-tender Extremities: Extremities normal, atraumatic, no cyanosis or edema Pulses: 2+ and symmetric all extremities Skin: Skin color, texture, turgor normal, no rashes or lesions  NEUROLOGIC:   Mental status: Alert and oriented x4,  no aphasia, good attention span, fund of knowledge, and memory Motor Exam - grossly normal Sensory Exam - grossly normal Reflexes: 1+ Coordination - grossly normal Gait - antalgic Balance - grossly normal Cranial Nerves: I: smell Not tested  II: visual acuity  OS: nl    OD: nl  II: visual fields Full to confrontation  II: pupils Equal, round, reactive to light  III,VII: ptosis None  III,IV,VI: extraocular muscles  Full ROM  V: mastication Normal  V: facial light touch sensation  Normal  V,VII: corneal reflex  Present  VII: facial muscle function - upper  Normal  VII: facial muscle function - lower Normal  VIII: hearing Not tested  IX: soft palate elevation  Normal  IX,X: gag  reflex Present  XI: trapezius strength  5/5  XI: sternocleidomastoid  strength 5/5  XI: neck flexion strength  5/5  XII: tongue strength  Normal    Data Review Lab Results  Component Value Date   WBC 6.2 05/02/2023   HGB 17.0 05/02/2023   HCT 50.4 05/02/2023   MCV 94.0 05/02/2023   PLT 217 05/02/2023   Lab Results  Component Value Date   NA 135 05/02/2023   K 3.7 05/02/2023   CL 100 05/02/2023   CO2 28 05/02/2023   BUN 12 05/02/2023   CREATININE 1.13 05/02/2023   GLUCOSE 110 (H) 05/02/2023   Lab Results  Component Value Date   INR 1.1 05/02/2023    Assessment/Plan:  Estimated body mass index is 32.06 kg/m as calculated from the following:   Height as of this encounter: 6\' 1"  (1.854 m).   Weight as of this encounter: 110.2 kg. Patient admitted for L1-3 hardware removal and re-do L1-3 fusion. Patient has failed a reasonable attempt at conservative therapy.  I explained the condition and procedure to the patient and answered any questions.  Patient wishes to proceed with procedure as planned. Understands risks/ benefits and typical outcomes of procedure.   Tia Alert 05/06/2023 7:21 AM

## 2023-05-06 NOTE — Anesthesia Procedure Notes (Signed)
Procedure Name: Intubation Date/Time: 05/06/2023 7:53 AM  Performed by: Ayesha Rumpf, CRNAPre-anesthesia Checklist: Patient identified, Emergency Drugs available, Suction available and Patient being monitored Patient Re-evaluated:Patient Re-evaluated prior to induction Oxygen Delivery Method: Circle System Utilized Preoxygenation: Pre-oxygenation with 100% oxygen Induction Type: IV induction Ventilation: Mask ventilation with difficulty and Oral airway inserted - appropriate to patient size Laryngoscope Size: Glidescope and 4 Grade View: Grade I Tube type: Oral Tube size: 7.5 mm Number of attempts: 1 Airway Equipment and Method: Stylet and Oral airway Placement Confirmation: ETT inserted through vocal cords under direct vision, positive ETCO2 and breath sounds checked- equal and bilateral Secured at: 23 cm Tube secured with: Tape Dental Injury: Teeth and Oropharynx as per pre-operative assessment  Comments: Two handed mask ventilation with OAW.  Easy video laryngscopy assisted intubation.

## 2023-05-06 NOTE — Op Note (Signed)
05/06/2023  9:23 AM  PATIENT:  Darin Gomez  72 y.o. male  PRE-OPERATIVE DIAGNOSIS: Pseudoarthrosis L1-2, loosening of instrumentation, back pain  POST-OPERATIVE DIAGNOSIS:  same  PROCEDURE: Lumbar reexploration with extraction of segmental instrumentation L1-L3, exploration of fusion L1-L2 3 to confirm pseudoarthrosis, redo posterolateral intertransverse arthrodesis L1-L3 utilizing morselized allograft  SURGEON:  Marikay Alar, MD  ASSISTANTS: Verlin Dike, FNP  ANESTHESIA:   General  EBL: 50 ml  Total I/O In: 750 [I.V.:750] Out: 50 [Blood:50]  BLOOD ADMINISTERED: none  DRAINS: none  SPECIMEN:  none  INDICATION FOR PROCEDURE: This patient presented with severe back pain. Imaging showed loosening of instrumentation at L1 with probable pseudoarthrosis L1-2. The patient tried conservative measures without relief. Pain was debilitating. Recommended for reexploration with exploration of fusion L1-L3 with removal of segmental instrumentation followed by redo posterolateral arthrodesis L1-L3.Marland Kitchen Patient understood the risks, benefits, and alternatives and potential outcomes and wished to proceed.  PROCEDURE DETAILS: The patient was taken to the operating room and after induction of adequate generalized endotracheal anesthesia, the patient was rolled into the prone position on chest rolls and all pressure points were padded. The lumbar region was cleaned with Betadine scrub, and then prepped with DuraPrep and draped in the usual sterile fashion. 5 cc of local anesthesia was injected and then a dorsal midline incision was made and carried down to the lumbo sacral fascia. The fascia was opened and the paraspinous musculature was taken down in a subperiosteal fashion to expose the old instrumentation from L1-L3. Intraoperative x-ray confirmed my level, and then I remove the locking caps from the pedicle screws and remove the rods.  The L1 screws were somewhat loose and these were removed.  The  L2 and the L3 screws had good purchase and these were left in place in case they were ever needed for thoracic or lumbar fusion in the future.  I then dissected out over the pedicle screws to find the transverse processes of L1 and L2 bilaterally.  There was no significant bone growth between L1 and L2 confirming pseudoarthrosis.  There did seem to be some bone growth at L2-3 but it was difficult to confirm solid arthrodesis.  I irrigated with Vashe followed by saline solution.  Corticated the transverse processes of L1 and L2 and the previously placed bone at L2-3 and then placed a mixture of a small BMP followed by DBM fibers and cortical bone chips to perform arthrodesis L1-L3 bilaterally.  Then closed the fascia with 0 Vicryl. I closed the subcutaneous tissues with 2-0 Vicryl and the subcuticular tissues with 3-0 Vicryl. The skin was then closed with  Dermabond because of his adhesive allergy.  The drapes were removed, a sterile dressing was applied.  My nurse practitioner was involved in the exposure, removal of the instrumentation and the posterolateral arthrodesis, and the closure. the patient was awakened from general anesthesia and transferred to the recovery room in stable condition. At the end of the procedure all sponge, needle and instrument counts were correct.    PLAN OF CARE: Admit to inpatient   PATIENT DISPOSITION:  PACU - hemodynamically stable.   Delay start of Pharmacological VTE agent (>24hrs) due to surgical blood loss or risk of bleeding:  yes

## 2023-05-06 NOTE — Plan of Care (Signed)

## 2023-05-07 MED ORDER — OXYCODONE HCL 15 MG PO TABS: 15.0000 mg | ORAL_TABLET | ORAL | 0 refills | Status: AC | PRN

## 2023-05-07 NOTE — Plan of Care (Signed)
  Problem: Education: Goal: Knowledge of General Education information will improve Description: Including pain rating scale, medication(s)/side effects and non-pharmacologic comfort measures Outcome: Adequate for Discharge   Problem: Health Behavior/Discharge Planning: Goal: Ability to manage health-related needs will improve Outcome: Adequate for Discharge   Problem: Clinical Measurements: Goal: Ability to maintain clinical measurements within normal limits will improve Outcome: Adequate for Discharge Goal: Will remain free from infection Outcome: Adequate for Discharge Goal: Diagnostic test results will improve Outcome: Adequate for Discharge Goal: Respiratory complications will improve Outcome: Adequate for Discharge Goal: Cardiovascular complication will be avoided Outcome: Adequate for Discharge   Problem: Activity: Goal: Risk for activity intolerance will decrease Outcome: Adequate for Discharge   Problem: Nutrition: Goal: Adequate nutrition will be maintained Outcome: Adequate for Discharge   Problem: Coping: Goal: Level of anxiety will decrease Outcome: Adequate for Discharge   Problem: Elimination: Goal: Will not experience complications related to bowel motility Outcome: Adequate for Discharge Goal: Will not experience complications related to urinary retention Outcome: Adequate for Discharge   Problem: Pain Management: Goal: General experience of comfort will improve Outcome: Adequate for Discharge   Problem: Safety: Goal: Ability to remain free from injury will improve Outcome: Adequate for Discharge   Problem: Skin Integrity: Goal: Risk for impaired skin integrity will decrease Outcome: Adequate for Discharge   Problem: Education: Goal: Ability to verbalize activity precautions or restrictions will improve Outcome: Adequate for Discharge Goal: Knowledge of the prescribed therapeutic regimen will improve Outcome: Adequate for Discharge Goal:  Understanding of discharge needs will improve Outcome: Adequate for Discharge   Problem: Activity: Goal: Ability to avoid complications of mobility impairment will improve Outcome: Adequate for Discharge Goal: Ability to tolerate increased activity will improve Outcome: Adequate for Discharge Goal: Will remain free from falls Outcome: Adequate for Discharge   Problem: Bowel/Gastric: Goal: Gastrointestinal status for postoperative course will improve Outcome: Adequate for Discharge   Problem: Clinical Measurements: Goal: Ability to maintain clinical measurements within normal limits will improve Outcome: Adequate for Discharge Goal: Postoperative complications will be avoided or minimized Outcome: Adequate for Discharge Goal: Diagnostic test results will improve Outcome: Adequate for Discharge   Problem: Pain Management: Goal: Pain level will decrease Outcome: Adequate for Discharge   Problem: Skin Integrity: Goal: Will show signs of wound healing Outcome: Adequate for Discharge   Problem: Health Behavior/Discharge Planning: Goal: Identification of resources available to assist in meeting health care needs will improve Outcome: Adequate for Discharge   Problem: Bladder/Genitourinary: Goal: Urinary functional status for postoperative course will improve Outcome: Adequate for Discharge

## 2023-05-07 NOTE — Care Plan (Signed)
Patient discharged home to self/ family care. Patient alert and oriented, able to make needs known upon discharge. Investment banker, operational observed bleeding at incision site. On-call provider Terrilee Files, MD made aware. Lovell Sheehan, MD cleared patient for discharge. All discharge instructions including medication regimen and discharge instructions explained to patient. PIV to R hand removed. All personal belongings taken upon discharge.

## 2023-05-07 NOTE — Discharge Summary (Signed)
Physician Discharge Summary  [name] MRN: [id number] DOB/AGE: February 28, 1951 72 y.o.  Admit date: 05/06/2023 Discharge date: 05/07/2023  Admission Diagnoses: Lumbar pseudoarthrosis, lumbago  Discharge Diagnoses: The same Principal Problem:   S/P lumbar fusion   Discharged Condition: good  Hospital Course: Dr. Yetta Barre performed a revision of the patient's lumbar fusion on 05/06/2023.  The patient's postoperative course was unremarkable.  On postoperative day #1 the patient requested discharge to home.  The patient, and his wife, were given verbal and written discharge instructions.  All their questions were answered.  Consults: None Significant Diagnostic Studies: None Treatments: Revision of lumbar instrumentation and fusion Discharge Exam: Blood pressure 132/70, pulse (!) 52, temperature 98 F (36.7 C), temperature source Oral, resp. rate 15, height 6\' 1"  (1.854 m), weight 110.2 kg, SpO2 94%. The patient is alert and pleasant.  His strength is normal.  Disposition: Home   Allergies as of 05/07/2023       Reactions   Gabapentin Swelling   Keppra [levetiracetam] Nausea And Vomiting   Tape Rash   Plastic tape: Paper tape ONLY!   Valium [diazepam] Other (See Comments)   Significant depression; "a different person"        Medication List     STOP taking these medications    zolpidem 10 MG tablet Commonly known as: AMBIEN       TAKE these medications    acetaminophen 500 MG tablet Commonly known as: TYLENOL Take 2 tablets (1,000 mg total) by mouth every 8 (eight) hours. What changed:  when to take this reasons to take this   aspirin 81 MG chewable tablet Chew 81 mg by mouth at bedtime.   cetirizine 10 MG tablet Commonly known as: ZYRTEC Take 10 mg by mouth daily.   famotidine 20 MG tablet Commonly known as: PEPCID Take 20 mg by mouth daily.   hydrOXYzine 25 MG tablet Commonly known as: ATARAX Take 25 mg by mouth at bedtime.    lisinopril-hydrochlorothiazide 10-12.5 MG tablet Commonly known as: ZESTORETIC Take 1 tablet by mouth daily.   methocarbamol 750 MG tablet Commonly known as: ROBAXIN Take 750 mg by mouth 3 (three) times daily.   morphine 30 MG 12 hr tablet Commonly known as: MS CONTIN Take 30 mg by mouth every 12 (twelve) hours.   nitroGLYCERIN 0.4 MG SL tablet Commonly known as: NITROSTAT Place 1 tablet (0.4 mg total) under the tongue every 5 (five) minutes as needed for chest pain.   nystatin cream Commonly known as: MYCOSTATIN Apply 1 application. topically daily as needed (yeast).   oxyCODONE 15 MG immediate release tablet Commonly known as: ROXICODONE Take 1 tablet (15 mg total) by mouth every 4 (four) hours as needed for pain.   polyethylene glycol 17 g packet Commonly known as: MIRALAX / GLYCOLAX Take 17 g by mouth 2 (two) times daily.   pravastatin 80 MG tablet Commonly known as: PRAVACHOL Take 1 tablet (80 mg total) by mouth daily.   pregabalin 75 MG capsule Commonly known as: LYRICA TAKE ONE CAPSULE BY MOUTH THREE TIMES DAILY What changed:  how much to take when to take this additional instructions   sildenafil 100 MG tablet Commonly known as: VIAGRA Take 100 mg by mouth daily as needed for erectile dysfunction.   testosterone cypionate 200 MG/ML injection Commonly known as: DEPOTESTOSTERONE CYPIONATE Inject 200 mg into the muscle every 14 (fourteen) days.   Vitamin D (Ergocalciferol) 1.25 MG (50000 UNIT) Caps capsule Commonly known as: DRISDOL Take 50,000  Units by mouth 2 (two) times a week.               Durable Medical Equipment  (From admission, onward)           Start     Ordered   05/06/23 1036  DME Walker rolling  Once       Question:  Patient needs a walker to treat with the following condition  Answer:  S/P lumbar fusion   05/06/23 1035             Signed: Cristi Loron 05/07/2023, 10:04 AM

## 2023-05-07 NOTE — TOC Transition Note (Signed)
Transition of Care [facility]) - CM/SW Discharge Note   Patient Details  Name: Darin Gomez MRN: [id number] Date of Birth: [birth year]  [facility] CM/SW Contact:  Ronny Bacon, RN Phone Number: 05/07/2023, 11:35 AM   Clinical Narrative:   Patient is being discharged today. RW ordered through [place] with [employer].    Final next level of care: Home/Self Care Barriers to Discharge: No Barriers Identified   Patient Goals and CMS Choice      Discharge Placement                         Discharge Plan and Services Additional resources added to the After Visit Summary for                  DME Arranged: Walker rolling DME Agency: [employer] Date DME Agency Contacted: 05/07/23 Time DME Agency Contacted: 1133 Representative spoke with at DME Agency: Vaughan Basta            Social Determinants of Health (SDOH) Interventions SDOH Screenings   Food Insecurity: No Food Insecurity (05/06/2023)  Housing: Patient Declined (05/06/2023)  Transportation Needs: No Transportation Needs (05/06/2023)  Utilities: Not At Risk (05/06/2023)  Depression (PHQ2-9): Low Risk  (08/28/2018)  Financial Resource Strain: Low Risk  (10/11/2018)  Physical Activity: Insufficiently Active (10/11/2018)  Social Connections: Unknown (10/11/2018)  Stress: Stress Concern Present (10/11/2018)  Tobacco Use: Medium Risk (05/06/2023)     Readmission Risk Interventions     No data to display

## 2023-08-16 ENCOUNTER — Encounter: Payer: Self-pay | Admitting: Pediatrics

## 2023-09-06 ENCOUNTER — Ambulatory Visit (AMBULATORY_SURGERY_CENTER)

## 2023-09-06 VITALS — Ht 73.0 in | Wt 245.0 lb

## 2023-09-06 DIAGNOSIS — Z860100 Personal history of colon polyps, unspecified: Secondary | ICD-10-CM

## 2023-09-06 MED ORDER — NA SULFATE-K SULFATE-MG SULF 17.5-3.13-1.6 GM/177ML PO SOLN: 1.0000 | Freq: Once | ORAL | 0 refills | Status: AC

## 2023-09-06 NOTE — Progress Notes (Signed)
 No egg or soy allergy known to patient  No issues known to pt with past sedation with any surgeries or procedures Patient denies ever being told they had issues or difficulty with intubation  No FH of Malignant Hyperthermia Pt is not on diet pills Pt is not on  home 02  Pt is not on blood thinners  Pt has issues with constipation Lubisprostone, miralax and stool softner No A fib or A flutter Have any cardiac testing pending--no Pt can ambulate with cane Pt denies use of chewing tobacco Discussed diabetic I weight loss medication holds Discussed NSAID holds Checked BMI Pt instructed to use [employer] or GoodRx for a price reduction on prep  Patient's chart reviewed by Cathlyn Parsons CNRA prior to previsit and patient appropriate for the [facility].  Pre visit completed and red dot placed by patient's name on their procedure day (on provider's schedule).

## 2023-09-21 NOTE — Progress Notes (Unsigned)
 [facility] History and Physical   Primary Care Physician:  Aloha Arnold, PA-C   Reason for Procedure:  History of adenomatous colon polyp  Plan:    Surveillance colonoscopy     HPI: Darin Gomez is a 73 y.o. male undergoing surveillance colonoscopy for a history of adenomatous colon polyp.  Last colonoscopy was performed in 2019 disclosing 2 subcentimeter polyps.  These were a tubular adenoma and hyperplastic polyp on pathology.  No family history of colon cancer.  Chart documents a brother with colon polyps.  Patient denies change in bowel habits or rectal bleeding at the time of this exam.   Past Medical History:  Diagnosis Date   Allergy    Arthritis    mainly in back   Back pain    Balance disorder    uses cane daily   Blood in urine    Cataract    Chest pain    Constipation    Diverticulosis 02/14/2018   noted on colonoscopy   DOE (dyspnea on exertion)    ED (erectile dysfunction)    Environmental allergies    Foot swelling    Bilateral   GERD (gastroesophageal reflux disease)    Headache(784.0)    occas.  migraines  (from last surgery)   Heart murmur    as child....Aaron Aasno problems as an adult   History of colon polyps 02/14/2018   Hyperlipidemia    Hypertension    dx  10 yrs or more  ago   Insomnia    Neck pain    Neuromuscular disorder (HCC)    bilateral feet   Night sweats    Nonobstructive CAD (coronary artery disease)    a. 01/2014 MV: mild lateral ischemia. EF 56%; b. 02/2014 Cath: LM nl, LAD nl, LCX min irregs/<10%m, RCA mild ectasia mid vessel, otw nl, EF 55-65%.   Painful swelling of joint    PONV (postoperative nausea and vomiting)    Poor circulation of extremity    legs   Sinus disorder    Spinal headache    Vertigo     Past Surgical History:  Procedure Laterality Date   ABDOMINAL EXPOSURE N/A 09/14/2019   Procedure: ABDOMINAL EXPOSURE;  Surgeon: Young Hensen, MD;  Location: [facility] OR;  Service: Vascular;  Laterality:  N/A;   ANTERIOR LAT LUMBAR FUSION Right 09/14/2019   Procedure: Right Lumbar three-four Lumbar four-five  Anterolateral lumbar interbody fusion;  Surgeon: Manya Sells, MD;  Location: [facility] OR;  Service: Neurosurgery;  Laterality: Right;   ANTERIOR LUMBAR FUSION N/A 09/14/2019   Procedure: Lumbar five Sacral one Anterior lumbar interbody fusion;  Surgeon: Manya Sells, MD;  Location: [facility] OR;  Service: Neurosurgery;  Laterality: N/A;   BACK SURGERY     CARDIAC CATHETERIZATION     03/04/14   COLONOSCOPY     FRACTURE SURGERY     left arm   KNEE SURGERY Right 01/02/2018   torn minicus   LAMINECTOMY WITH POSTERIOR LATERAL ARTHRODESIS LEVEL 1 N/A 05/06/2023   Procedure: Extraction of instrumentation Lumbar one-three,  posterolateral fusion Lumbar one-two, two-three;  Surgeon: Joaquin Mulberry, MD;  Location: [facility] OR;  Service: Neurosurgery;  Laterality: N/A;   LAMINECTOMY WITH POSTERIOR LATERAL ARTHRODESIS LEVEL 2 N/A 04/14/2022   Procedure: Posterior lateral fusion - Lumbar one-three, extension of instrumentation Lumbar one-three, exploration of fusion;  Surgeon: Isadora Mar, MD;  Location: [facility] OR;  Service: Neurosurgery;  Laterality: N/A;   LEFT HEART CATHETERIZATION WITH CORONARY ANGIOGRAM N/A 03/04/2014  Procedure: LEFT HEART CATHETERIZATION WITH CORONARY ANGIOGRAM;  Surgeon: Peter M Swaziland, MD;  Location: [facility] CATH LAB;  Service: Cardiovascular;  Laterality: N/A;   LUMBAR LAMINECTOMY/DECOMPRESSION MICRODISCECTOMY  03/15/2012   Procedure: LUMBAR LAMINECTOMY/DECOMPRESSION MICRODISCECTOMY 2 LEVELS;  Surgeon: Adelbert Adler, MD;  Location: [facility] NEURO ORS;  Service: Neurosurgery;  Laterality: Bilateral;  Bilateral Lumbar three, lumbar four, lumbar five Laminectomy   LUMBAR PERCUTANEOUS PEDICLE SCREW 3 LEVEL N/A 09/14/2019   Procedure: Lumbar three to Sacral one posterior pedicle screw fixation;  Surgeon: Manya Sells, MD;  Location: [facility] OR;  Service: Neurosurgery;  Laterality: N/A;   POLYPECTOMY      SPINAL CORD STIMULATOR BATTERY EXCHANGE N/A 09/14/2021   Procedure: Removal of spinal cord stimulator leads;  Surgeon: Isadora Mar, MD;  Location: [facility] OR;  Service: Neurosurgery;  Laterality: N/A;   SPINAL CORD STIMULATOR INSERTION N/A 05/29/2014   Procedure: LUMBAR SPINAL CORD STIMULATOR INSERTION;  Surgeon: Darryl Endow, MD;  Location: [facility] NEURO ORS;  Service: Neurosurgery;  Laterality: N/A;   SPINAL CORD STIMULATOR INSERTION N/A 10/07/2021   Procedure: Spinal cord stimulator via Thoracic nine laminectomy;  Surgeon: Isadora Mar, MD;  Location: [facility] OR;  Service: Neurosurgery;  Laterality: N/A;   TOTAL KNEE ARTHROPLASTY Right 02/08/2019   Procedure: TOTAL KNEE ARTHROPLASTY;  Surgeon: Claiborne Crew, MD;  Location: [facility] ORS;  Service: Orthopedics;  Laterality: Right;  70 mins    Prior to Admission medications   Medication Sig Start Date End Date Taking? Authorizing Provider  acetaminophen  (TYLENOL ) 500 MG tablet Take 2 tablets (1,000 mg total) by mouth every 8 (eight) hours. Patient taking differently: Take 1,000 mg by mouth every 6 (six) hours as needed for moderate pain (pain score 4-6). 02/09/19   Equilla Hastings, PA-C  aspirin  81 MG chewable tablet Chew 81 mg by mouth at bedtime.    [provider]  cetirizine (ZYRTEC) 10 MG tablet Take 10 mg by mouth daily.     [provider]  famotidine  (PEPCID ) 20 MG tablet Take 20 mg by mouth daily.    [provider]  hydrOXYzine  (ATARAX ) 25 MG tablet Take 25 mg by mouth at bedtime.    [provider]  lisinopril -hydrochlorothiazide  (ZESTORETIC ) 10-12.5 MG tablet Take 1 tablet by mouth daily. 01/27/21   Gollan, Besnik J, MD  LUBIPROSTONE PO  08/15/23   [provider]  methocarbamol  (ROBAXIN ) 750 MG tablet Take 750 mg by mouth 3 (three) times daily. 11/20/20   [provider]  morphine  (MS CONTIN ) 30 MG 12 hr tablet Take 30 mg by mouth every 12 (twelve) hours.    [provider]  nitroGLYCERIN   (NITROSTAT ) 0.4 MG SL tablet Place 1 tablet (0.4 mg total) under the tongue every 5 (five) minutes as needed for chest pain. 01/27/21   Gollan, Williom J, MD  nystatin  cream (MYCOSTATIN ) Apply 1 application. topically daily as needed (yeast). Patient not taking: Reported on 09/06/2023    [provider]  oxyCODONE  (ROXICODONE ) 15 MG immediate release tablet Take 1 tablet (15 mg total) by mouth every 4 (four) hours as needed for pain. 05/07/23   Garry Kansas, MD  polyethylene glycol (MIRALAX  / GLYCOLAX ) 17 g packet Take 17 g by mouth 2 (two) times daily. 02/09/19   Equilla Hastings, PA-C  pravastatin  (PRAVACHOL ) 80 MG tablet Take 1 tablet (80 mg total) by mouth daily. 01/27/21   Gollan, Morris J, MD  pregabalin  (LYRICA ) 75 MG capsule TAKE ONE CAPSULE BY MOUTH THREE TIMES DAILY  Patient  taking differently: Take 75-150 mg by mouth See admin instructions. Take 75 mg by mouth in the morning and 150 mg in the afternoon and bedtime 12/13/18   Cephus Collin, MD  sildenafil (VIAGRA) 100 MG tablet Take 100 mg by mouth daily as needed for erectile dysfunction.    [provider]  testosterone  cypionate (DEPOTESTOSTERONE CYPIONATE) 200 MG/ML injection Inject 200 mg into the muscle every 14 (fourteen) days. 08/21/19   [provider]  Vitamin D , Ergocalciferol , (DRISDOL ) 50000 UNITS CAPS capsule Take 50,000 Units by mouth 2 (two) times a week.     [provider]  zolpidem  (AMBIEN ) 10 MG tablet Take 10 mg by mouth at bedtime as needed. 08/17/23   [provider]    Current Outpatient Medications  Medication Sig Dispense Refill   aspirin  81 MG chewable tablet Chew 81 mg by mouth at bedtime.     cetirizine (ZYRTEC) 10 MG tablet Take 10 mg by mouth daily.      famotidine  (PEPCID ) 20 MG tablet Take 20 mg by mouth daily.     hydrOXYzine  (ATARAX ) 25 MG tablet Take 25 mg by mouth at bedtime.     lisinopril -hydrochlorothiazide  (ZESTORETIC ) 10-12.5 MG tablet Take 1 tablet by  mouth daily. 90 tablet 3   LUBIPROSTONE PO      methocarbamol  (ROBAXIN ) 750 MG tablet Take 750 mg by mouth 3 (three) times daily.     morphine  (MS CONTIN ) 30 MG 12 hr tablet Take 30 mg by mouth every 12 (twelve) hours.     polyethylene glycol (MIRALAX  / GLYCOLAX ) 17 g packet Take 17 g by mouth 2 (two) times daily. 28 packet 0   pravastatin  (PRAVACHOL ) 80 MG tablet Take 1 tablet (80 mg total) by mouth daily. 90 tablet 3   pregabalin  (LYRICA ) 75 MG capsule TAKE ONE CAPSULE BY MOUTH THREE TIMES DAILY  (Patient taking differently: Take 75-150 mg by mouth See admin instructions. Take 75 mg by mouth in the morning and 150 mg in the afternoon and bedtime) 90 capsule 0   testosterone  cypionate (DEPOTESTOSTERONE CYPIONATE) 200 MG/ML injection Inject 200 mg into the muscle every 14 (fourteen) days.     Vitamin D , Ergocalciferol , (DRISDOL ) 50000 UNITS CAPS capsule Take 50,000 Units by mouth 2 (two) times a week.      zolpidem  (AMBIEN ) 10 MG tablet Take 10 mg by mouth at bedtime as needed.     acetaminophen  (TYLENOL ) 500 MG tablet Take 2 tablets (1,000 mg total) by mouth every 8 (eight) hours. (Patient taking differently: Take 1,000 mg by mouth every 6 (six) hours as needed for moderate pain (pain score 4-6).) 30 tablet 0   nitroGLYCERIN  (NITROSTAT ) 0.4 MG SL tablet Place 1 tablet (0.4 mg total) under the tongue every 5 (five) minutes as needed for chest pain. (Patient not taking: Reported on 09/23/2023) 25 tablet 3   nystatin  cream (MYCOSTATIN ) Apply 1 application. topically daily as needed (yeast). (Patient not taking: Reported on 09/06/2023)     oxyCODONE  (ROXICODONE ) 15 MG immediate release tablet Take 1 tablet (15 mg total) by mouth every 4 (four) hours as needed for pain. 30 tablet 0   sildenafil (VIAGRA) 100 MG tablet Take 100 mg by mouth daily as needed for erectile dysfunction.     Current Facility-Administered Medications  Medication Dose Route Frequency Provider Last Rate Last Admin   0.9 %  sodium  chloride infusion  500 mL Intravenous Once Alitzel Cookson, Scarlette Currier, MD        Allergies as  of 09/23/2023 - Review Complete 09/23/2023  Allergen Reaction Noted   Gabapentin Swelling 03/15/2012   Keppra [levetiracetam] Nausea And Vomiting 03/13/2012   Tape Rash 10/05/2021   Valium  [diazepam ] Other (See Comments) 02/24/2012    Family History  Problem Relation Age of Onset   Heart attack Mother    Hypertension Mother    Hyperlipidemia Mother    Diabetes Mother    Cancer Father    Heart attack Brother 51   Colon polyps Brother    Hyperlipidemia Brother    Hypertension Brother    Heart disease Brother    Breast cancer Neg Hx    Colon cancer Neg Hx    Esophageal cancer Neg Hx    Liver cancer Neg Hx    Ovarian cancer Neg Hx    Pancreatic cancer Neg Hx    Prostate cancer Neg Hx    Rectal cancer Neg Hx    Stomach cancer Neg Hx     Social History   Socioeconomic History   Marital status: Married    Spouse name: Tanis Fan   Number of children: 3   Years of education: Not on file   Highest education level: Some college, no degree  Occupational History   Not on file  Tobacco Use   Smoking status: Former    Types: Cigarettes    Passive exposure: Past   Smokeless tobacco: Former    Types: Chew    Quit date: 2013   Tobacco comments:    quit smoking cigarettes > 30 years ago  Vaping Use   Vaping status: Never Used  Substance and Sexual Activity   Alcohol use: Yes    Alcohol/week: 2.0 - 6.0 standard drinks of alcohol    Types: 2 - 3 Shots of liquor per week   Drug use: Never   Sexual activity: Yes  Other Topics Concern   Not on file  Social History Narrative   Not on file   Social Drivers of Health   Financial Resource Strain: Low Risk  (10/11/2018)   Overall Financial Resource Strain (CARDIA)    Difficulty of Paying Living Expenses: Not very hard  Food Insecurity: No Food Insecurity (05/06/2023)   Hunger Vital Sign    Worried About Running Out of Food in the Last Year: Never  true    Ran Out of Food in the Last Year: Never true  Transportation Needs: No Transportation Needs (05/06/2023)   PRAPARE - Administrator, Civil Service (Medical): No    Lack of Transportation (Non-Medical): No  Physical Activity: Insufficiently Active (10/11/2018)   Exercise Vital Sign    Days of Exercise per Week: 2 days    Minutes of Exercise per Session: 20 min  Stress: Stress Concern Present (10/11/2018)   [employer] of Occupational Health - Occupational Stress Questionnaire    Feeling of Stress : To some extent  Social Connections: Unknown (10/11/2018)   Social Connection and Isolation Panel [NHANES]    Frequency of Communication with Friends and Family: Not on file    Frequency of Social Gatherings with Friends and Family: Not on file    Attends Religious Services: 1 to 4 times per year    Active Member of [employer] or Organizations: No    Attends Banker Meetings: Never    Marital Status: Married  Catering manager Violence: Not At Risk (05/06/2023)   Humiliation, Afraid, Rape, and Kick questionnaire    Fear of Current or Ex-Partner: No    Emotionally  Abused: No    Physically Abused: No    Sexually Abused: No    Review of Systems:  All other review of systems negative except as mentioned in the HPI.  Physical Exam: Vital signs BP (!) 145/72   Pulse (!) 56   Temp 98 F (36.7 C) (Temporal)   Ht 6\' 1"  (1.854 m)   Wt 241 lb (109.3 kg)   SpO2 98%   BMI 31.80 kg/m   General:   Alert,  Well-developed, well-nourished, pleasant and cooperative in NAD Airway:  Mallampati 1 Lungs:  Clear throughout to auscultation.   Heart:  Regular rate and rhythm; no murmurs, clicks, rubs,  or gallops. Abdomen:  Soft, nontender and nondistended. Normal bowel sounds.   Neuro/Psych:  Normal mood and affect. A and O x 3  Eugenia Hess, MD [facility]

## 2023-09-23 ENCOUNTER — Ambulatory Visit: Admitting: Pediatrics

## 2023-09-23 ENCOUNTER — Encounter: Payer: Self-pay | Admitting: Pediatrics

## 2023-09-23 VITALS — BP 143/74 | HR 55 | Temp 98.0°F | Resp 15 | Ht 73.0 in | Wt 241.0 lb

## 2023-09-23 DIAGNOSIS — K635 Polyp of colon: Secondary | ICD-10-CM | POA: Diagnosis not present

## 2023-09-23 DIAGNOSIS — D123 Benign neoplasm of transverse colon: Secondary | ICD-10-CM | POA: Diagnosis not present

## 2023-09-23 DIAGNOSIS — Z860101 Personal history of adenomatous and serrated colon polyps: Secondary | ICD-10-CM

## 2023-09-23 DIAGNOSIS — Z1211 Encounter for screening for malignant neoplasm of colon: Secondary | ICD-10-CM | POA: Diagnosis not present

## 2023-09-23 DIAGNOSIS — K5730 Diverticulosis of large intestine without perforation or abscess without bleeding: Secondary | ICD-10-CM

## 2023-09-23 DIAGNOSIS — K648 Other hemorrhoids: Secondary | ICD-10-CM | POA: Diagnosis not present

## 2023-09-23 DIAGNOSIS — Z860100 Personal history of colon polyps, unspecified: Secondary | ICD-10-CM

## 2023-09-23 MED ORDER — SODIUM CHLORIDE 0.9 % IV SOLN: 500.0000 mL | Freq: Once | INTRAVENOUS | Status: DC

## 2023-09-23 NOTE — Progress Notes (Signed)
 Called to room to assist during endoscopic procedure.  Patient ID and intended procedure confirmed with present staff. Received instructions for my participation in the procedure from the performing physician.

## 2023-09-23 NOTE — Op Note (Signed)
 [facility] Patient Name: Darin Gomez Procedure Date: 09/23/2023 8:38 AM MRN: [id number] Endoscopist: Eugenia Hess , MD, [id number] Age: 73 Referring MD:  Date of Birth: 06-11-1950 Gender: Male Account #: [id number] Procedure:                Colonoscopy Indications:              High risk colon cancer surveillance: Personal                            history of non-advanced adenoma, Last colonoscopy:                            September 2019 Medicines:                Monitored Anesthesia Care Procedure:                Pre-Anesthesia Assessment:                           - Prior to the procedure, a History and Physical                            was performed, and patient medications and                            allergies were reviewed. The patient's tolerance of                            previous anesthesia was also reviewed. The risks                            and benefits of the procedure and the sedation                            options and risks were discussed with the patient.                            All questions were answered, and informed consent                            was obtained. Prior Anticoagulants: The patient has                            taken no anticoagulant or antiplatelet agents. ASA                            Grade Assessment: II - A patient with mild systemic                            disease. After reviewing the risks and benefits,                            the patient was deemed in satisfactory condition to  undergo the procedure.                           After obtaining informed consent, the colonoscope                            was passed under direct vision. Throughout the                            procedure, the patient's blood pressure, pulse, and                            oxygen saturations were monitored continuously. The                            CF [id number] #[id number] was introduced through the  anus                            and advanced to the cecum, identified by                            appendiceal orifice and ileocecal valve. The                            colonoscopy was performed without difficulty. The                            patient tolerated the procedure well. The quality                            of the bowel preparation was good. The ileocecal                            valve, appendiceal orifice, and rectum were                            photographed. Scope In: 8:44:23 AM Scope Out: 8:58:50 AM Scope Withdrawal Time: 0 hours 9 minutes 46 seconds  Total Procedure Duration: 0 hours 14 minutes 27 seconds  Findings:                 The perianal and digital rectal examinations were                            normal. Pertinent negatives include normal                            sphincter tone and no palpable rectal lesions.                           Multiple medium-mouthed and small-mouthed                            diverticula were found in the sigmoid colon.  A 4 mm polyp was found in the transverse colon. The                            polyp was sessile. The polyp was removed with a                            cold snare. Resection and retrieval were complete.                           Internal hemorrhoids were found during retroflexion. Complications:            No immediate complications. Estimated blood loss:                            Minimal. Estimated Blood Loss:     Estimated blood loss was minimal. Impression:               - Diverticulosis in the sigmoid colon.                           - One 4 mm polyp in the transverse colon, removed                            with a cold snare. Resected and retrieved.                           - Internal hemorrhoids. Recommendation:           - Discharge patient to home.                           - Await pathology results.                           - Repeat colonoscopy for surveillance  based on                            pathology results.                           - The findings and recommendations were discussed                            with the patient's family.                           - Patient has a contact number available for                            emergencies. The signs and symptoms of potential                            delayed complications were discussed with the                            patient. Return to normal activities tomorrow.  Written discharge instructions were provided to the                            patient. Eugenia Hess, MD 09/23/2023 9:02:20 AM This report has been signed electronically.

## 2023-09-23 NOTE — Progress Notes (Signed)
 Pt's states no medical or surgical changes since previsit or office visit.

## 2023-09-23 NOTE — Progress Notes (Signed)
 Sedate, gd SR, tolerated procedure well, VSS, report to RN

## 2023-09-23 NOTE — Patient Instructions (Signed)

## 2023-09-26 ENCOUNTER — Telehealth: Payer: Self-pay

## 2023-09-26 NOTE — Telephone Encounter (Signed)
 No answer, left message to call if having any issues or concerns, B.Vale Mousseau RN

## 2023-09-27 ENCOUNTER — Encounter: Payer: Self-pay | Admitting: Pediatrics

## 2023-09-27 LAB — SURGICAL PATHOLOGY

## 2023-11-03 ENCOUNTER — Encounter: Payer: Self-pay | Admitting: Medical

## 2024-04-30 ENCOUNTER — Other Ambulatory Visit: Payer: Self-pay | Admitting: Neurological Surgery

## 2024-04-30 DIAGNOSIS — G8929 Other chronic pain: Secondary | ICD-10-CM

## 2024-05-03 ENCOUNTER — Other Ambulatory Visit: Payer: Self-pay | Admitting: Neurological Surgery

## 2024-05-03 DIAGNOSIS — G8929 Other chronic pain: Secondary | ICD-10-CM

## 2024-05-16 ENCOUNTER — Other Ambulatory Visit: Payer: Self-pay | Admitting: Orthopedic Surgery

## 2024-05-16 DIAGNOSIS — M19011 Primary osteoarthritis, right shoulder: Secondary | ICD-10-CM

## 2024-06-07 ENCOUNTER — Ambulatory Visit
Admission: RE | Admit: 2024-06-07 | Discharge: 2024-06-07 | Disposition: A | Discharge status: Home / Self Care | Source: Ambulatory Visit | Attending: Orthopedic Surgery | Admitting: Orthopedic Surgery

## 2024-06-07 DIAGNOSIS — M19011 Primary osteoarthritis, right shoulder: Secondary | ICD-10-CM

## 2024-06-07 MED ORDER — IOPAMIDOL (ISOVUE-M 200) INJECTION 41%
10.0000 mL | Freq: Once | INTRAMUSCULAR | Status: AC
  Administered 2024-06-07: 10 mL via INTRA_ARTICULAR

## 2024-06-11 ENCOUNTER — Telehealth: Payer: Self-pay | Admitting: Cardiovascular Disease

## 2024-06-11 NOTE — Telephone Encounter (Signed)
 Patient called to say that he doesn't need to see the heart dr anymore. States that everything is fine. Please advise

## 2024-06-19 ENCOUNTER — Ambulatory Visit: Admitting: Medical

## 2024-06-26 ENCOUNTER — Ambulatory Visit: Attending: Medical | Admitting: Medical

## 2024-06-26 ENCOUNTER — Encounter: Payer: Self-pay | Admitting: Medical

## 2024-06-26 VITALS — BP 140/70 | HR 72 | Ht 73.0 in | Wt 248.0 lb

## 2024-06-26 DIAGNOSIS — Z01810 Encounter for preprocedural cardiovascular examination: Secondary | ICD-10-CM | POA: Diagnosis not present

## 2024-06-26 DIAGNOSIS — I2510 Atherosclerotic heart disease of native coronary artery without angina pectoris: Secondary | ICD-10-CM | POA: Insufficient documentation

## 2024-06-26 DIAGNOSIS — I10 Essential (primary) hypertension: Secondary | ICD-10-CM | POA: Insufficient documentation

## 2024-06-26 DIAGNOSIS — E782 Mixed hyperlipidemia: Secondary | ICD-10-CM | POA: Diagnosis not present

## 2024-06-26 DIAGNOSIS — I700 Atherosclerosis of aorta: Secondary | ICD-10-CM | POA: Insufficient documentation

## 2024-06-26 DIAGNOSIS — Z79899 Other long term (current) drug therapy: Secondary | ICD-10-CM | POA: Diagnosis not present

## 2024-06-26 MED ORDER — ROSUVASTATIN CALCIUM 40 MG PO TABS: 40.0000 mg | ORAL_TABLET | Freq: Every day | ORAL | 3 refills | Status: AC

## 2024-06-26 NOTE — Progress Notes (Signed)
" °  Cardiology Office Note   Date:  06/26/2024  ID:  Darin Gomez, Darin Gomez [birth year], MRN [id number] PCP: Montey Lot, PA-C  [facility] Providers Cardiologist:  Evalene Lunger, MD    History of Present Illness Darin Gomez is a 74 y.o. male with a hx of aortic atherosclerosis, hypertension, hyperlipidemia, chronic pain who presents for 1 year follow-up.   Patient has history of remote heart catheter on the hospital in October 2015 that showed no significant CAD and normal LV function.  The patient was last seen 04/2023 for pre-operative cardiac evaluation. He was stable from a cardiac perspective.   Today, the patient reports he is having right shoulder surgery next month. He fell and injured his shoulder and is having nerve pain as well. He is overall stable from a cardiac perspective. He denies chest pain, SOB, Lower leg edema, leg pain. He has vertigo. He denies heart racing or palpitations. He tries to walk half mile daily. His chronic back pain greatly affects his function.  Studies Reviewed EKG Interpretation Date/Time:  Tuesday June 26 2024 11:26:06 EST Ventricular Rate:  72 PR Interval:  168 QRS Duration:  92 QT Interval:  350 QTC Calculation: 383 R Axis:   -2  Text Interpretation: Normal sinus rhythm Normal ECG When compared with ECG of 11-Apr-2023 11:15, No significant change was found Confirmed by Franchester, Chetan Mehring ([id number]) on 06/26/2024 11:27:59 AM        Physical Exam VS:  BP (!) 140/70 (BP Location: Left Arm, Patient Position: Sitting, Cuff Size: Normal)   Pulse 72   Ht 6' 1 (1.854 m)   Wt 248 lb (112.5 kg)   SpO2 96%   BMI 32.72 kg/m        Wt Readings from Last 3 Encounters:  06/26/24 248 lb (112.5 kg)  09/23/23 241 lb (109.3 kg)  09/06/23 245 lb (111.1 kg)    GEN: Well nourished, well developed in no acute distress NECK: No JVD; No carotid bruits CARDIAC: RRR, no murmurs, rubs, gallops RESPIRATORY:  Clear to auscultation without rales,  wheezing or rhonchi  ABDOMEN: Soft, non-tender, non-distended EXTREMITIES:  No edema; No deformity   ASSESSMENT AND PLAN  Pre-operative cardiac evaluation HTN Aortic calcification/CAD The patient is needing right shoulder replacement due to injury he had after a fall. He is overall stable from a cardiac perspective. He denies chest pain, SOB, lower leg edema. He has vertigo and takes meclizine . He walks 1/2 mild daily. His back pain greatly affects his function. EKG is normal. Can stop ASA per surgeons instructions.  METS>4 RCRI = 0.5% risk of MACE  HLD LDL 77 on last check . I will change pravastatin  to Crestor  40mg  daily. Repeat fasting lipids and LFTs in 2 months.        Dispo: follow-up in 1 year  Signed, Adryanna Friedt VEAR Franchester, PA-C   "

## 2024-06-26 NOTE — Patient Instructions (Signed)
 Medication Instructions:  Your physician recommends the following medication changes.  STOP TAKING: Pravastatin    START TAKING: Rosuvastatin  (Crestor ) 40 mg by mouth daily    *If you need a refill on your cardiac medications before your next appointment, please call your pharmacy*  Lab Work: Your provider would like for you to return in 3 months to have the following labs drawn: Lipid, LFT.   Please go to [facility] [address] ([facility] [address], [place] [address] You do not need an appointment.  They are open from 8 am- 4:30 pm.  Lunch from 1:00 pm- 2:00 pm You will need to be fasting.    Testing/Procedures: No test ordered today   Follow-Up: At [facility], you and your health needs are our priority.  As part of our continuing mission to provide you with exceptional heart care, our providers are all part of one team.  This team includes your primary Cardiologist (physician) and Advanced Practice Providers or APPs (Physician Assistants and Nurse Practitioners) who all work together to provide you with the care you need, when you need it.  Your next appointment:   1 year(s)  Provider:   Evalene Lunger, MD or Cadence Franchester, PA-C

## 2024-07-11 ENCOUNTER — Encounter (HOSPITAL_COMMUNITY)

## 2024-07-24 ENCOUNTER — Ambulatory Visit (HOSPITAL_COMMUNITY): Admit: 2024-07-24 | Admitting: Orthopedic Surgery

## 2024-07-24 SURGERY — ARTHROPLASTY, SHOULDER, TOTAL, REVERSE
Anesthesia: General | Site: Shoulder | Laterality: Right
